# Patient Record
Sex: Female | Born: 1970
Health system: Southern US, Community
[De-identification: ages and names within clinical notes are randomized; demographics above are authoritative.]

## PROBLEM LIST (undated history)

## (undated) DIAGNOSIS — G8929 Other chronic pain: Secondary | ICD-10-CM

## (undated) DIAGNOSIS — T7840XA Allergy, unspecified, initial encounter: Secondary | ICD-10-CM

## (undated) DIAGNOSIS — G5600 Carpal tunnel syndrome, unspecified upper limb: Secondary | ICD-10-CM

## (undated) DIAGNOSIS — N889 Noninflammatory disorder of cervix uteri, unspecified: Secondary | ICD-10-CM

## (undated) DIAGNOSIS — M545 Low back pain: Secondary | ICD-10-CM

## (undated) DIAGNOSIS — N83209 Unspecified ovarian cyst, unspecified side: Secondary | ICD-10-CM

## (undated) DIAGNOSIS — K589 Irritable bowel syndrome without diarrhea: Secondary | ICD-10-CM

## (undated) DIAGNOSIS — F419 Anxiety disorder, unspecified: Secondary | ICD-10-CM

## (undated) DIAGNOSIS — R51 Headache: Secondary | ICD-10-CM

## (undated) DIAGNOSIS — D259 Leiomyoma of uterus, unspecified: Secondary | ICD-10-CM

## (undated) DIAGNOSIS — I10 Essential (primary) hypertension: Secondary | ICD-10-CM

## (undated) DIAGNOSIS — A64 Unspecified sexually transmitted disease: Secondary | ICD-10-CM

## (undated) DIAGNOSIS — M542 Cervicalgia: Secondary | ICD-10-CM

## (undated) DIAGNOSIS — N9489 Other specified conditions associated with female genital organs and menstrual cycle: Secondary | ICD-10-CM

## (undated) DIAGNOSIS — K649 Unspecified hemorrhoids: Secondary | ICD-10-CM

## (undated) HISTORY — DX: Irritable bowel syndrome, unspecified: K58.9

## (undated) HISTORY — DX: Unspecified ovarian cyst, unspecified side: N83.209

## (undated) HISTORY — DX: Cervicalgia: M54.2

## (undated) HISTORY — PX: EYE SURGERY: SHX253

## (undated) HISTORY — DX: Essential (primary) hypertension: I10

## (undated) HISTORY — PX: EMBOLIZATION: SHX1496

## (undated) HISTORY — DX: Low back pain: M54.5

## (undated) HISTORY — DX: Other specified conditions associated with female genital organs and menstrual cycle: N94.89

## (undated) HISTORY — DX: Allergy, unspecified, initial encounter: T78.40XA

## (undated) HISTORY — PX: FRACTURE SURGERY: SHX138

## (undated) HISTORY — DX: Unspecified sexually transmitted disease: A64

## (undated) HISTORY — DX: Carpal tunnel syndrome, unspecified upper limb: G56.00

## (undated) HISTORY — PX: LIPOSUCTION: SHX10

## (undated) HISTORY — DX: Noninflammatory disorder of cervix uteri, unspecified: N88.9

## (undated) HISTORY — DX: Unspecified hemorrhoids: K64.9

---

## 1991-02-19 HISTORY — PX: EYE SURGERY: SHX253

## 1991-02-19 HISTORY — PX: FACIAL FRACTURE SURGERY: SHX1570

## 1994-02-18 HISTORY — PX: TUBAL LIGATION: SHX77

## 1997-08-24 ENCOUNTER — Encounter: Admission: RE | Admit: 1997-08-24 | Discharge: 1997-08-24 | Payer: Self-pay | Admitting: Family Medicine

## 1997-09-02 ENCOUNTER — Encounter: Admission: RE | Admit: 1997-09-02 | Discharge: 1997-09-02 | Payer: Self-pay | Admitting: Family Medicine

## 1997-09-29 ENCOUNTER — Encounter: Admission: RE | Admit: 1997-09-29 | Discharge: 1997-09-29 | Payer: Self-pay | Admitting: Family Medicine

## 1997-10-21 ENCOUNTER — Other Ambulatory Visit: Admission: RE | Admit: 1997-10-21 | Discharge: 1997-10-21 | Payer: Self-pay | Admitting: Legal Medicine

## 1997-10-21 ENCOUNTER — Encounter: Admission: RE | Admit: 1997-10-21 | Discharge: 1997-10-21 | Payer: Self-pay | Admitting: Family Medicine

## 1998-02-09 ENCOUNTER — Encounter: Admission: RE | Admit: 1998-02-09 | Discharge: 1998-02-09 | Payer: Self-pay | Admitting: Sports Medicine

## 1998-07-11 ENCOUNTER — Encounter: Admission: RE | Admit: 1998-07-11 | Discharge: 1998-07-11 | Payer: Self-pay | Admitting: Family Medicine

## 1998-10-09 ENCOUNTER — Encounter: Admission: RE | Admit: 1998-10-09 | Discharge: 1998-10-09 | Payer: Self-pay | Admitting: Family Medicine

## 1998-10-25 ENCOUNTER — Encounter: Admission: RE | Admit: 1998-10-25 | Discharge: 1998-10-25 | Payer: Self-pay | Admitting: Family Medicine

## 1999-01-22 ENCOUNTER — Encounter: Admission: RE | Admit: 1999-01-22 | Discharge: 1999-01-22 | Payer: Self-pay | Admitting: Family Medicine

## 1999-03-14 ENCOUNTER — Encounter: Admission: RE | Admit: 1999-03-14 | Discharge: 1999-03-14 | Payer: Self-pay | Admitting: Family Medicine

## 1999-04-22 ENCOUNTER — Emergency Department (HOSPITAL_COMMUNITY): Admission: EM | Admit: 1999-04-22 | Discharge: 1999-04-22 | Payer: Self-pay | Admitting: *Deleted

## 1999-06-06 ENCOUNTER — Other Ambulatory Visit: Admission: RE | Admit: 1999-06-06 | Discharge: 1999-06-06 | Payer: Self-pay | Admitting: Obstetrics & Gynecology

## 1999-06-15 ENCOUNTER — Emergency Department (HOSPITAL_COMMUNITY): Admission: EM | Admit: 1999-06-15 | Discharge: 1999-06-15 | Payer: Self-pay | Admitting: Emergency Medicine

## 2000-06-23 ENCOUNTER — Other Ambulatory Visit: Admission: RE | Admit: 2000-06-23 | Discharge: 2000-06-23 | Payer: Self-pay | Admitting: Obstetrics & Gynecology

## 2002-02-18 DIAGNOSIS — N83209 Unspecified ovarian cyst, unspecified side: Secondary | ICD-10-CM

## 2002-02-18 HISTORY — DX: Unspecified ovarian cyst, unspecified side: N83.209

## 2002-05-19 ENCOUNTER — Encounter: Admission: RE | Admit: 2002-05-19 | Discharge: 2002-05-19 | Payer: Self-pay | Admitting: Family Medicine

## 2002-08-02 ENCOUNTER — Other Ambulatory Visit: Admission: RE | Admit: 2002-08-02 | Discharge: 2002-08-02 | Payer: Self-pay | Admitting: Family Medicine

## 2002-08-02 ENCOUNTER — Encounter: Admission: RE | Admit: 2002-08-02 | Discharge: 2002-08-02 | Payer: Self-pay | Admitting: Family Medicine

## 2002-08-03 ENCOUNTER — Encounter: Payer: Self-pay | Admitting: Sports Medicine

## 2002-08-03 ENCOUNTER — Encounter: Admission: RE | Admit: 2002-08-03 | Discharge: 2002-08-03 | Payer: Self-pay | Admitting: Sports Medicine

## 2002-11-23 ENCOUNTER — Encounter: Admission: RE | Admit: 2002-11-23 | Discharge: 2002-11-23 | Payer: Self-pay | Admitting: Sports Medicine

## 2002-11-30 ENCOUNTER — Encounter: Admission: RE | Admit: 2002-11-30 | Discharge: 2002-11-30 | Payer: Self-pay | Admitting: Sports Medicine

## 2002-11-30 ENCOUNTER — Encounter: Payer: Self-pay | Admitting: Sports Medicine

## 2002-12-09 ENCOUNTER — Encounter: Admission: RE | Admit: 2002-12-09 | Discharge: 2002-12-09 | Payer: Self-pay | Admitting: Family Medicine

## 2003-01-12 ENCOUNTER — Encounter: Admission: RE | Admit: 2003-01-12 | Discharge: 2003-01-12 | Payer: Self-pay | Admitting: Family Medicine

## 2003-02-02 ENCOUNTER — Encounter: Admission: RE | Admit: 2003-02-02 | Discharge: 2003-03-03 | Payer: Self-pay | Admitting: Orthopedic Surgery

## 2003-02-14 ENCOUNTER — Encounter: Admission: RE | Admit: 2003-02-14 | Discharge: 2003-02-14 | Payer: Self-pay | Admitting: Family Medicine

## 2003-03-09 ENCOUNTER — Ambulatory Visit (HOSPITAL_COMMUNITY): Admission: RE | Admit: 2003-03-09 | Discharge: 2003-03-09 | Payer: Self-pay | Admitting: Orthopedic Surgery

## 2003-03-11 ENCOUNTER — Encounter: Admission: RE | Admit: 2003-03-11 | Discharge: 2003-03-11 | Payer: Self-pay | Admitting: Family Medicine

## 2003-03-16 ENCOUNTER — Encounter: Admission: RE | Admit: 2003-03-16 | Discharge: 2003-03-16 | Payer: Self-pay | Admitting: Orthopedic Surgery

## 2003-04-14 ENCOUNTER — Encounter: Admission: RE | Admit: 2003-04-14 | Discharge: 2003-04-14 | Payer: Self-pay | Admitting: Orthopedic Surgery

## 2003-04-29 ENCOUNTER — Encounter: Admission: RE | Admit: 2003-04-29 | Discharge: 2003-04-29 | Payer: Self-pay | Admitting: Orthopedic Surgery

## 2003-06-23 ENCOUNTER — Emergency Department (HOSPITAL_COMMUNITY): Admission: EM | Admit: 2003-06-23 | Discharge: 2003-06-23 | Payer: Self-pay | Admitting: Emergency Medicine

## 2004-02-19 DIAGNOSIS — K649 Unspecified hemorrhoids: Secondary | ICD-10-CM

## 2004-02-19 HISTORY — DX: Unspecified hemorrhoids: K64.9

## 2004-04-06 ENCOUNTER — Ambulatory Visit: Payer: Self-pay | Admitting: Family Medicine

## 2004-04-06 ENCOUNTER — Other Ambulatory Visit: Admission: RE | Admit: 2004-04-06 | Discharge: 2004-04-06 | Payer: Self-pay | Admitting: Family Medicine

## 2004-04-24 ENCOUNTER — Ambulatory Visit (HOSPITAL_COMMUNITY): Admission: RE | Admit: 2004-04-24 | Discharge: 2004-04-24 | Payer: Self-pay | Admitting: Family Medicine

## 2004-07-05 ENCOUNTER — Ambulatory Visit: Payer: Self-pay | Admitting: Family Medicine

## 2004-07-20 ENCOUNTER — Inpatient Hospital Stay (HOSPITAL_COMMUNITY): Admission: AD | Admit: 2004-07-20 | Discharge: 2004-07-20 | Payer: Self-pay | Admitting: Obstetrics & Gynecology

## 2004-08-06 ENCOUNTER — Ambulatory Visit: Payer: Self-pay | Admitting: Family Medicine

## 2004-12-12 ENCOUNTER — Emergency Department (HOSPITAL_COMMUNITY): Admission: EM | Admit: 2004-12-12 | Discharge: 2004-12-12 | Payer: Self-pay | Admitting: Family Medicine

## 2005-01-07 ENCOUNTER — Ambulatory Visit: Payer: Self-pay | Admitting: Family Medicine

## 2005-01-14 ENCOUNTER — Encounter: Admission: RE | Admit: 2005-01-14 | Discharge: 2005-01-14 | Payer: Self-pay | Admitting: Family Medicine

## 2005-01-25 ENCOUNTER — Ambulatory Visit: Payer: Self-pay | Admitting: Family Medicine

## 2005-02-13 ENCOUNTER — Ambulatory Visit (HOSPITAL_COMMUNITY): Admission: RE | Admit: 2005-02-13 | Discharge: 2005-02-13 | Payer: Self-pay | Admitting: Gastroenterology

## 2005-03-28 ENCOUNTER — Ambulatory Visit (HOSPITAL_COMMUNITY): Admission: RE | Admit: 2005-03-28 | Discharge: 2005-03-28 | Payer: Self-pay | Admitting: Family Medicine

## 2005-03-28 ENCOUNTER — Ambulatory Visit: Payer: Self-pay | Admitting: Family Medicine

## 2005-11-18 ENCOUNTER — Encounter (INDEPENDENT_AMBULATORY_CARE_PROVIDER_SITE_OTHER): Payer: Self-pay | Admitting: *Deleted

## 2005-11-18 LAB — CONVERTED CEMR LAB

## 2005-11-19 ENCOUNTER — Ambulatory Visit: Payer: Self-pay | Admitting: Family Medicine

## 2005-11-19 ENCOUNTER — Other Ambulatory Visit: Admission: RE | Admit: 2005-11-19 | Discharge: 2005-11-19 | Payer: Self-pay | Admitting: Family Medicine

## 2006-04-17 DIAGNOSIS — E669 Obesity, unspecified: Secondary | ICD-10-CM | POA: Insufficient documentation

## 2006-04-17 DIAGNOSIS — K589 Irritable bowel syndrome without diarrhea: Secondary | ICD-10-CM | POA: Insufficient documentation

## 2006-04-17 DIAGNOSIS — F339 Major depressive disorder, recurrent, unspecified: Secondary | ICD-10-CM | POA: Insufficient documentation

## 2006-04-18 ENCOUNTER — Encounter (INDEPENDENT_AMBULATORY_CARE_PROVIDER_SITE_OTHER): Payer: Self-pay | Admitting: *Deleted

## 2006-07-04 ENCOUNTER — Ambulatory Visit: Payer: Self-pay | Admitting: Family Medicine

## 2006-07-04 ENCOUNTER — Encounter (INDEPENDENT_AMBULATORY_CARE_PROVIDER_SITE_OTHER): Payer: Self-pay | Admitting: Family Medicine

## 2006-07-04 LAB — CONVERTED CEMR LAB
Blood Glucose, Fingerstick: 98
Nitrite: NEGATIVE
Protein, U semiquant: 30
Specific Gravity, Urine: >=1.03
Urobilinogen, UA: 0.2
WBC Urine, dipstick: NEGATIVE

## 2006-07-07 ENCOUNTER — Encounter (INDEPENDENT_AMBULATORY_CARE_PROVIDER_SITE_OTHER): Payer: Self-pay | Admitting: Family Medicine

## 2006-07-24 ENCOUNTER — Telehealth: Payer: Self-pay | Admitting: *Deleted

## 2006-10-01 ENCOUNTER — Ambulatory Visit: Payer: Self-pay | Admitting: Family Medicine

## 2006-10-03 ENCOUNTER — Encounter (INDEPENDENT_AMBULATORY_CARE_PROVIDER_SITE_OTHER): Payer: Self-pay | Admitting: *Deleted

## 2006-10-27 ENCOUNTER — Encounter (INDEPENDENT_AMBULATORY_CARE_PROVIDER_SITE_OTHER): Payer: Self-pay | Admitting: Family Medicine

## 2006-11-04 ENCOUNTER — Encounter (INDEPENDENT_AMBULATORY_CARE_PROVIDER_SITE_OTHER): Payer: Self-pay | Admitting: Family Medicine

## 2006-11-04 ENCOUNTER — Ambulatory Visit: Payer: Self-pay | Admitting: Family Medicine

## 2006-11-04 LAB — CONVERTED CEMR LAB
ALT: 17 units/L (ref 0–35)
AST: 14 units/L (ref 0–37)
CO2: 22 meq/L (ref 19–32)
Calcium: 8.8 mg/dL (ref 8.4–10.5)
Chloride: 107 meq/L (ref 96–112)
Platelets: 301 10*3/uL (ref 150–400)
Potassium: 3.8 meq/L (ref 3.5–5.3)
RDW: 13.4 % (ref 11.5–14.0)
Sodium: 142 meq/L (ref 135–145)
Total Protein: 7.6 g/dL (ref 6.0–8.3)
WBC: 9.2 10*3/uL (ref 4.0–10.5)

## 2006-12-10 ENCOUNTER — Encounter (INDEPENDENT_AMBULATORY_CARE_PROVIDER_SITE_OTHER): Payer: Self-pay | Admitting: Family Medicine

## 2007-01-21 ENCOUNTER — Encounter (INDEPENDENT_AMBULATORY_CARE_PROVIDER_SITE_OTHER): Payer: Self-pay | Admitting: Family Medicine

## 2007-02-20 ENCOUNTER — Ambulatory Visit: Payer: Self-pay | Admitting: Family Medicine

## 2007-09-09 ENCOUNTER — Ambulatory Visit: Payer: Self-pay | Admitting: Family Medicine

## 2007-09-09 ENCOUNTER — Encounter: Payer: Self-pay | Admitting: Family Medicine

## 2007-09-09 LAB — CONVERTED CEMR LAB
Cholesterol: 188 mg/dL (ref 0–200)
Hemoglobin: 13.1 g/dL (ref 12.0–15.0)
LDL Cholesterol: 131 mg/dL — ABNORMAL HIGH (ref 0–99)
MCHC: 33.5 g/dL (ref 30.0–36.0)
MCV: 88.1 fL (ref 78.0–100.0)
RBC: 4.44 M/uL (ref 3.87–5.11)
RDW: 13.8 % (ref 11.5–15.5)
Triglycerides: 129 mg/dL (ref <150)
VLDL: 26 mg/dL (ref 0–40)

## 2007-09-17 ENCOUNTER — Encounter: Payer: Self-pay | Admitting: Family Medicine

## 2007-10-09 ENCOUNTER — Ambulatory Visit: Payer: Self-pay | Admitting: Family Medicine

## 2007-10-09 LAB — CONVERTED CEMR LAB: Hemoglobin: 12.7 g/dL

## 2007-10-13 ENCOUNTER — Encounter (INDEPENDENT_AMBULATORY_CARE_PROVIDER_SITE_OTHER): Payer: Self-pay | Admitting: *Deleted

## 2007-11-13 ENCOUNTER — Encounter: Payer: Self-pay | Admitting: *Deleted

## 2007-12-03 ENCOUNTER — Ambulatory Visit: Payer: Self-pay | Admitting: Family Medicine

## 2007-12-03 ENCOUNTER — Other Ambulatory Visit: Admission: RE | Admit: 2007-12-03 | Discharge: 2007-12-03 | Payer: Self-pay | Admitting: Family Medicine

## 2007-12-03 ENCOUNTER — Encounter: Payer: Self-pay | Admitting: Family Medicine

## 2007-12-03 LAB — CONVERTED CEMR LAB
Glucose, Urine, Semiquant: NEGATIVE
Nitrite: NEGATIVE
Specific Gravity, Urine: 1.025
Whiff Test: NEGATIVE
pH: 5.5

## 2007-12-04 ENCOUNTER — Encounter: Payer: Self-pay | Admitting: Family Medicine

## 2007-12-04 LAB — CONVERTED CEMR LAB
Chlamydia, DNA Probe: NEGATIVE
GC Probe Amp, Genital: NEGATIVE

## 2007-12-14 ENCOUNTER — Encounter: Payer: Self-pay | Admitting: Family Medicine

## 2007-12-18 ENCOUNTER — Encounter: Payer: Self-pay | Admitting: Family Medicine

## 2007-12-22 ENCOUNTER — Telehealth (INDEPENDENT_AMBULATORY_CARE_PROVIDER_SITE_OTHER): Payer: Self-pay | Admitting: *Deleted

## 2008-01-13 ENCOUNTER — Ambulatory Visit: Payer: Self-pay | Admitting: Family Medicine

## 2008-01-13 LAB — CONVERTED CEMR LAB
Bilirubin Urine: NEGATIVE
Glucose, Urine, Semiquant: NEGATIVE
Specific Gravity, Urine: 1.02
pH: 6.5

## 2008-01-25 ENCOUNTER — Encounter: Payer: Self-pay | Admitting: Family Medicine

## 2008-01-25 ENCOUNTER — Ambulatory Visit: Payer: Self-pay | Admitting: Family Medicine

## 2008-01-25 LAB — CONVERTED CEMR LAB
Blood in Urine, dipstick: NEGATIVE
Ketones, urine, test strip: NEGATIVE
Nitrite: NEGATIVE
Protein, U semiquant: NEGATIVE
Specific Gravity, Urine: 1.02
Urobilinogen, UA: 0.2

## 2008-01-26 ENCOUNTER — Encounter: Payer: Self-pay | Admitting: Family Medicine

## 2008-01-29 ENCOUNTER — Telehealth: Payer: Self-pay | Admitting: Family Medicine

## 2008-01-29 ENCOUNTER — Encounter: Payer: Self-pay | Admitting: Family Medicine

## 2008-03-30 ENCOUNTER — Telehealth: Payer: Self-pay | Admitting: Family Medicine

## 2008-03-31 ENCOUNTER — Ambulatory Visit: Payer: Self-pay | Admitting: Family Medicine

## 2008-03-31 ENCOUNTER — Encounter: Payer: Self-pay | Admitting: Family Medicine

## 2008-03-31 LAB — CONVERTED CEMR LAB
Bilirubin Urine: NEGATIVE
Blood in Urine, dipstick: NEGATIVE
Chlamydia, DNA Probe: NEGATIVE
Urobilinogen, UA: 0.2
Whiff Test: NEGATIVE

## 2008-05-09 ENCOUNTER — Encounter: Payer: Self-pay | Admitting: Family Medicine

## 2008-07-06 ENCOUNTER — Telehealth: Payer: Self-pay | Admitting: Family Medicine

## 2008-07-28 ENCOUNTER — Encounter: Payer: Self-pay | Admitting: Family Medicine

## 2008-07-28 ENCOUNTER — Ambulatory Visit: Payer: Self-pay | Admitting: Family Medicine

## 2008-07-29 LAB — CONVERTED CEMR LAB
ALT: 16 units/L (ref 0–35)
Alkaline Phosphatase: 45 units/L (ref 39–117)
MCHC: 32.4 g/dL (ref 30.0–36.0)
MCV: 90.1 fL (ref 78.0–100.0)
Platelets: 324 10*3/uL (ref 150–400)
Sodium: 142 meq/L (ref 135–145)
Total Bilirubin: 0.4 mg/dL (ref 0.3–1.2)
Total Protein: 7.6 g/dL (ref 6.0–8.3)
WBC: 12.3 10*3/uL — ABNORMAL HIGH (ref 4.0–10.5)

## 2008-08-01 ENCOUNTER — Telehealth: Payer: Self-pay | Admitting: Family Medicine

## 2008-09-08 ENCOUNTER — Ambulatory Visit: Payer: Self-pay | Admitting: Family Medicine

## 2008-09-08 LAB — CONVERTED CEMR LAB
Blood in Urine, dipstick: NEGATIVE
Nitrite: NEGATIVE
Protein, U semiquant: NEGATIVE
Urobilinogen, UA: 0.2
WBC Urine, dipstick: NEGATIVE

## 2008-09-10 ENCOUNTER — Telehealth: Payer: Self-pay | Admitting: Family Medicine

## 2008-10-17 ENCOUNTER — Encounter: Payer: Self-pay | Admitting: Family Medicine

## 2008-12-26 ENCOUNTER — Encounter: Payer: Self-pay | Admitting: Family Medicine

## 2009-01-02 ENCOUNTER — Encounter: Payer: Self-pay | Admitting: Family Medicine

## 2009-01-02 ENCOUNTER — Other Ambulatory Visit: Admission: RE | Admit: 2009-01-02 | Discharge: 2009-01-02 | Payer: Self-pay | Admitting: Family Medicine

## 2009-01-02 ENCOUNTER — Ambulatory Visit: Payer: Self-pay | Admitting: Family Medicine

## 2009-01-03 LAB — CONVERTED CEMR LAB
Chlamydia, DNA Probe: NEGATIVE
GC Probe Amp, Genital: NEGATIVE
Hepatitis B Surface Ag: NEGATIVE

## 2009-01-04 ENCOUNTER — Encounter: Payer: Self-pay | Admitting: Family Medicine

## 2009-01-26 ENCOUNTER — Ambulatory Visit: Payer: Self-pay | Admitting: Family Medicine

## 2009-01-31 ENCOUNTER — Telehealth: Payer: Self-pay | Admitting: Family Medicine

## 2009-02-27 ENCOUNTER — Encounter: Payer: Self-pay | Admitting: Family Medicine

## 2009-04-04 ENCOUNTER — Telehealth: Payer: Self-pay | Admitting: Family Medicine

## 2009-04-05 ENCOUNTER — Ambulatory Visit: Payer: Self-pay | Admitting: Family Medicine

## 2009-04-05 ENCOUNTER — Encounter: Payer: Self-pay | Admitting: Family Medicine

## 2009-04-07 ENCOUNTER — Ambulatory Visit: Payer: Self-pay | Admitting: Family Medicine

## 2009-04-07 LAB — CONVERTED CEMR LAB: GC Probe Amp, Genital: POSITIVE — AB

## 2009-04-10 ENCOUNTER — Emergency Department (HOSPITAL_COMMUNITY): Admission: EM | Admit: 2009-04-10 | Discharge: 2009-04-11 | Payer: Self-pay | Admitting: Emergency Medicine

## 2009-04-26 ENCOUNTER — Telehealth: Payer: Self-pay | Admitting: Family Medicine

## 2009-04-28 ENCOUNTER — Encounter: Payer: Self-pay | Admitting: Family Medicine

## 2009-04-28 ENCOUNTER — Ambulatory Visit: Payer: Self-pay | Admitting: Family Medicine

## 2009-04-28 LAB — CONVERTED CEMR LAB
GC Probe Amp, Genital: NEGATIVE
Whiff Test: POSITIVE

## 2009-05-29 ENCOUNTER — Encounter: Payer: Self-pay | Admitting: Family Medicine

## 2009-07-27 ENCOUNTER — Telehealth: Payer: Self-pay | Admitting: Family Medicine

## 2009-07-27 ENCOUNTER — Ambulatory Visit: Payer: Self-pay | Admitting: Family Medicine

## 2009-08-14 ENCOUNTER — Encounter: Payer: Self-pay | Admitting: Family Medicine

## 2009-09-21 ENCOUNTER — Telehealth: Payer: Self-pay | Admitting: Family Medicine

## 2009-09-22 ENCOUNTER — Encounter: Payer: Self-pay | Admitting: Family Medicine

## 2009-09-22 ENCOUNTER — Ambulatory Visit: Payer: Self-pay | Admitting: Family Medicine

## 2009-09-22 LAB — CONVERTED CEMR LAB
Bilirubin Urine: NEGATIVE
Blood in Urine, dipstick: NEGATIVE
Glucose, Urine, Semiquant: NEGATIVE
Ketones, urine, test strip: NEGATIVE
Nitrite: NEGATIVE
Specific Gravity, Urine: 1.025
Urobilinogen, UA: 0.2
Whiff Test: NEGATIVE

## 2009-10-31 ENCOUNTER — Ambulatory Visit: Payer: Self-pay | Admitting: Family Medicine

## 2009-10-31 ENCOUNTER — Telehealth: Payer: Self-pay | Admitting: Family Medicine

## 2009-10-31 DIAGNOSIS — G44209 Tension-type headache, unspecified, not intractable: Secondary | ICD-10-CM | POA: Insufficient documentation

## 2009-11-08 ENCOUNTER — Ambulatory Visit: Payer: Self-pay | Admitting: Family Medicine

## 2009-11-22 ENCOUNTER — Telehealth: Payer: Self-pay | Admitting: Family Medicine

## 2009-11-23 ENCOUNTER — Ambulatory Visit: Payer: Self-pay | Admitting: Family Medicine

## 2009-11-23 ENCOUNTER — Encounter: Payer: Self-pay | Admitting: Sports Medicine

## 2009-12-21 ENCOUNTER — Encounter: Payer: Self-pay | Admitting: Family Medicine

## 2010-01-09 ENCOUNTER — Ambulatory Visit: Payer: Self-pay | Admitting: Family Medicine

## 2010-01-09 DIAGNOSIS — R339 Retention of urine, unspecified: Secondary | ICD-10-CM | POA: Insufficient documentation

## 2010-01-09 LAB — CONVERTED CEMR LAB
Glucose, Urine, Semiquant: NEGATIVE
Urobilinogen, UA: 0.2
pH: 7.5

## 2010-02-16 ENCOUNTER — Encounter
Admission: RE | Admit: 2010-02-16 | Discharge: 2010-02-16 | Payer: Self-pay | Source: Home / Self Care | Attending: Family Medicine | Admitting: Family Medicine

## 2010-02-16 ENCOUNTER — Ambulatory Visit
Admission: RE | Admit: 2010-02-16 | Discharge: 2010-02-16 | Payer: Self-pay | Source: Home / Self Care | Attending: Family Medicine | Admitting: Family Medicine

## 2010-02-16 DIAGNOSIS — M542 Cervicalgia: Secondary | ICD-10-CM | POA: Insufficient documentation

## 2010-02-16 DIAGNOSIS — K089 Disorder of teeth and supporting structures, unspecified: Secondary | ICD-10-CM | POA: Insufficient documentation

## 2010-02-21 ENCOUNTER — Telehealth: Payer: Self-pay | Admitting: Family Medicine

## 2010-03-05 ENCOUNTER — Encounter: Payer: Self-pay | Admitting: Family Medicine
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
## 2010-03-20 NOTE — Progress Notes (Signed)
Summary: triage  Phone Note Call from Patient Call back at Home Phone [phone]   Caller: Patient Summary of Call: Pt has gum infection and wondering if she can be seen today? Initial call taken by: Clydell Hakim,  July 27, 2009 8:44 AM  Follow-up for Phone Call        states she is having gum swelling & pain around a tooth that she wants to have fixed. no money at this time but they (dentist) told her she would have to be infection free first. to come now for work in Follow-up by: Golden Circle RN,  July 27, 2009 9:19 AM

## 2010-03-20 NOTE — Progress Notes (Signed)
  Phone Note Call from Patient   Caller: Patient Call For: [phone] Summary of Call: Patient having severe headaches.  Need to speak with nurse as soon as possible. Initial call taken by: Britta Mccreedy mcgregor  Follow-up for Phone Call        states she has alway had them but more severe over past 3 wks. usually takes aleve or excedrin pm which helps.  sleep helps.  was nauseous on saturday. muscle tension in neck as well. she will be here by 4 to see Dr. Wallene Huh. states this HA will not go away. states she is safe to drive Follow-up by: Golden Circle RN,  October 31, 2009 3:22 PM

## 2010-03-20 NOTE — Consult Note (Signed)
[facility]   Imported By: Clydell Hakim 04/03/2009 15:56:57  _____________________________________________________________________  External Attachment:    Type:   Image     Comment:   External Document

## 2010-03-20 NOTE — Progress Notes (Signed)
Summary: needs meds  Phone Note Call from Patient Call back at Home Phone [phone]   Caller: Patient Summary of Call: pt wants meds called in to yeast inf - got it from antibiotic [facility] Initial call taken by: De Nurse,  September 21, 2009 4:24 PM  Follow-up for Phone Call        she will see Dr. Denyse Amass tomorrow pm for this Follow-up by: Golden Circle RN,  September 21, 2009 4:32 PM

## 2010-03-20 NOTE — Assessment & Plan Note (Signed)
Summary: std check/[facility]   Vital Signs:  Patient profile:   40 year old female Weight:      196.3 pounds Temp:     98.7 degrees F oral Pulse rate:   102 / minute Pulse rhythm:   regular BP sitting:   125 / 82  (right arm) Cuff size:   large  Vitals Entered By: Loralee Pacas CMA (April 05, 2009 8:54 AM) Comments vaginal d/c slight odor   Primary Care Provider:  Milinda Antis MD   History of Present Illness: 1) STD check: Patient requests STD check. Last unprotected intercourse was two weeks ago, concerned about exposure to non-specific STD. Reports mild increase in vaginal discharge - now white with "different" odor. Denies vaginal pain, dysuria, vaginal lesions, back pain, dyspareunia, fever, chills, weight loss, night sweats. LMP wwas 03/11/09. BTL in 1996.     Allergies (verified): No Known Drug Allergies  Physical Exam  General:  vitals reviewed. obese tearful regarding STD possibility NAD  Mouth:  no oral lesions  Neck:  no lymphadenopathy   Lungs:  CTAB  Abdomen:  S/NT/ND +BS  Genitalia:  normal introitus and no external lesions. white vaginal discharge w/o odor. normal introitus, no external lesions, mucosa pink and moist, no vaginal or cervical lesions, normal uterus size and position, and no adnexal masses or tenderness.   Neurologic:  alert & oriented X3.     Impression & Recommendations:  Problem # 1:  EXPOSURE TO VIRAL DISEASE, NEC (ICD-V01.79) Assessment New Will check HIV, RPR. Encouraged condom use.  Orders: HIV-[facility] ([id number]) RPR-[facility] [id number]) [facility]- Est Level  3 (53664)  Problem # 2:  VAGINAL DISCHARGE (ICD-623.5) Assessment: New  Will check GC/CT, wet prep. Advised condom use. Wet prep +ve for BV. Will treat with metronidazole as below x 7 days.   Orders: [facility]- Est Level  3 ([address]347)  Complete Medication List: 1)  Bentyl 20 Mg Tabs (Dicyclomine hcl) .Marland Kitchen.. 1 by mouth qid x 7 days 2)  Acidophilus Caps (Lactobacillus) .Marland Kitchen.. 1 by mouth  daily x 7-10 days 3)  Metronidazole 500 Mg Tabs (Metronidazole) .... One tab by mouth two times a day x 7 days  Other Orders: GC[facility]lamydia[facility] ([id number]) [facility]  Patient Instructions: 1)  We will call with the rest of your test results. 2)  Take metronidazole for 7 days for bacterial vaginosis. This is not an  STD. 3)  Use condoms every time you have intercourse if your partner has not been tested for STDs or has an STD  Prescriptions: METRONIDAZOLE 500 MG TABS (METRONIDAZOLE) one tab by mouth two times a day x 7 days  #14 x 0   Entered and Authorized by:   Bobby Rumpf  MD   Signed by:   Bobby Rumpf  MD on 04/05/2009   Method used:   Electronically to        [facility] [address] [phone]* (retail)       [address]       [place], [place]  [address]       Ph: [phone] or [phone]       Fax: [phone]   RxID:   [id number]   Laboratory Results  Date/Time Received: April 05, 2009 9:29 AM  Date/Time Reported: April 05, 2009 9:39 AM   [employer] Source: vaginal WBC/hpf: 1-5 Bacteria/hpf: 3+  Cocci Clue cells/hpf: moderate  Positive whiff Yeast/hpf: none Trichomonas/hpf: none Comments: rod bacteria also  present ...........test performed by...........Marland KitchenTerese Door, CMA

## 2010-03-20 NOTE — Progress Notes (Signed)
Summary: triage  Phone Note Call from Patient Call back at Home Phone [phone]   Caller: Patient Summary of Call: pt is wanting to come in today to check for STD Initial call taken by: De Nurse,  April 04, 2009 10:02 AM  Follow-up for Phone Call        her partner told her to get checked. she does not know if he has something specific. will be here at 8:30 tomorrow am. declined the work in with wait today Follow-up by: Golden Circle RN,  April 04, 2009 10:07 AM

## 2010-03-20 NOTE — Progress Notes (Signed)
Summary: referral  Phone Note Call from Patient Call back at Home Phone [phone]   Caller: Patient Summary of Call: pt needs to have a mammogram and needs phys orders to go to [facility] hosp Initial call taken by: De Nurse,  April 26, 2009 3:26 PM  Follow-up for Phone Call        willn forward to MD for order please. Follow-up by: Theresia Lo RN,  April 26, 2009 3:46 PM  Additional Follow-up for Phone Call Additional follow up Details #1::        Order given Additional Follow-up by: Milinda Antis MD,  April 26, 2009 6:01 PM  New Problems: UNSPECIFIED BREAST SCREENING (ICD-V76.10)   New Problems: UNSPECIFIED BREAST SCREENING (ICD-V76.10)

## 2010-03-20 NOTE — Assessment & Plan Note (Signed)
Summary: bladder prob,df   Vital Signs:  Patient profile:   40 year old female Weight:      200 pounds Temp:     98.36 degrees F oral Pulse rate:   80 / minute Pulse rhythm:   regular BP sitting:   136 / 85  (left arm) Cuff size:   large  Vitals Entered By: Loralee Pacas CMA (January 09, 2010 1:46 PM) CC: ? bladder inf   Primary Care Provider:  Milinda Antis MD  CC:  ? bladder inf.  History of Present Illness:    Difficulty urinating for a few months, feels a lot of pressure and like she can not empty her bladder completely even though she has to urinate , urinating frequently states she drink a lot of fluid at night , Feel like she has dark urine, has been drinking a lot of water which helped the color. This weekened was laughing  and had small amount of  incontinence . no blood in urine noticed  Cramping in abdomen, feels tender in bladder, no vaginal discharge , no fever,  no dysuria,    Feels constipated thinks this is due to not eating her [place] Milch which regulates her IBS, also feels like this makes the pressure worse Pt concerned the urine symptoms were secondary to sexual intercourse and use of toys   Has been using nyquil to help her sleep,taking previous meds for tension HA- phenergan, flexeril, taking Excedrin HA, states naprosyn did not help     Current Medications (verified): 1)  Hydrocortisone 1 % Crea (Hydrocortisone) .... Apply To Affected Area Twice A Day As Needed Itch 2)  Keflex 500 Mg Caps (Cephalexin) .Marland Kitchen.. 1 By Mouth Two Times A Day X 3 Days 3)  Pyridium 100 Mg Tabs (Phenazopyridine Hcl) .Marland Kitchen.. 1 By Mouth Three Times A Day X 2 Days 4)  Fluconazole 150 Mg Tabs (Fluconazole) .Marland Kitchen.. 1 By Mouth X1 For Yeast Infection, May Repeat in 3 Days  Allergies (verified): No Known Drug Allergies  Physical Exam  General:  Well-developed,well-nourished,in no acute distress; alert,appropriate and cooperative throughout examination Vital signs noted  Abdomen:  soft, normal  bowel sounds, and no distention.  Bladder not distended, suprapubic tenderness No CVA tenderness TTP in lower quandrants, neg rosvings, no masses felt, no rebound, no gaurding   Impression & Recommendations:  Problem # 1:  UTI (ICD-599.0) Assessment New Will treat for umcomplicated UTI Her updated medication list for this problem includes:    Keflex 500 Mg Caps (Cephalexin) .Marland Kitchen... 1 by mouth two times a day x 3 days    Pyridium 100 Mg Tabs (Phenazopyridine hcl) .Marland Kitchen... 1 by mouth three times a day x 2 days  Orders: Urinalysis-[facility] (00000) [facility]- Est  Level 4 ([address])  Problem # 2:  URINARY RETENTION (ICD-788.20) Assessment: New  Pt describing some urinary retention,however over a prolonged period of time, no evidence of acute retention, therefore no cath done. Will d/c anti-cholinergic meds and those with like properties- flexeril, phenergan,nyquil pt to return for repeat visit Consider urology, bladder emptying scan if no improvment in symptoms  Orders: [facility]- Est  Level 4 ([address])  Complete Medication List: 1)  Hydrocortisone 1 % Crea (Hydrocortisone) .... Apply to affected area twice a day as needed itch 2)  Keflex 500 Mg Caps (Cephalexin) .Marland Kitchen.. 1 by mouth two times a day x 3 days 3)  Pyridium 100 Mg Tabs (Phenazopyridine hcl) .Marland Kitchen.. 1 by mouth three times a day x 2 days 4)  Fluconazole 150  Mg Tabs (Fluconazole) .Marland Kitchen.. 1 by mouth x1 for yeast infection, may repeat in 3 days  Patient Instructions: 1)  Use advil no more than twice a day for your headaches, do not use more than 2 days in a row, use massage as well 2)  Stop taking the nyquil, flexeril, phenergan 3)  Take the Keflex for your urine infection 4)  I have sent diflucan in case you get a yeast infection 5)  F/U in 1 month to see how your symptoms are  Prescriptions: FLUCONAZOLE 150 MG TABS (FLUCONAZOLE) 1 by mouth x1 for yeast infection, may repeat in 3 days  #2 x 0   Entered and Authorized by:   Milinda Antis MD   Signed by:    Milinda Antis MD on 01/09/2010   Method used:   Electronically to        [facility] [phone]* (retail)       [address]       [place], [place]  [address]       Ph: [phone] or [phone]       Fax: [phone]   RxID:   [id number] PYRIDIUM 100 MG TABS (PHENAZOPYRIDINE HCL) 1 by mouth three times a day x 2 days  #6 x 0   Entered and Authorized by:   Milinda Antis MD   Signed by:   Milinda Antis MD on 01/09/2010   Method used:   Electronically to        [facility] [phone]* (retail)       [address]       [place], [place]  [address]       Ph: [phone] or [phone]       Fax: [phone]   RxID:   [id number] KEFLEX 500 MG CAPS (CEPHALEXIN) 1 by mouth two times a day x 3 days  #6 x 0   Entered and Authorized by:   Milinda Antis MD   Signed by:   Milinda Antis MD on 01/09/2010   Method used:   Electronically to        [facility] [phone]* (retail)       [address]       [place], [place]  [address]       Ph: [phone] or [phone]       Fax: [phone]   RxID:   [id number]    Orders Added: 1)  Urinalysis-[facility] [00000] 2)  [facility]- Est  Level 4 [[address]]    Laboratory Results   Urine Tests  Date/Time Received: January 09, 2010 1:48 PM  Date/Time Reported: January 09, 2010 2:25 PM   Routine Urinalysis   Color: yellow Appearance: Clear Glucose: negative   (Normal Range: Negative) Bilirubin: negative   (Normal Range: Negative) Ketone: negative   (Normal Range: Negative) Spec. Gravity: 1.025   (Normal Range: 1.003-1.035) Blood: negative   (Normal Range: Negative) pH: 7.5   (Normal Range: 5.0-8.0) Protein: trace   (Normal Range: Negative) Urobilinogen: 0.2   (Normal Range: 0-1) Nitrite: negative   (Normal Range: Negative) Leukocyte Esterace: trace   (Normal Range: Negative)  Urine Microscopic WBC/HPF: 0-3 RBC/HPF:  0-3 Bacteria/HPF: 2+ Mucous/HPF: 2+ Epithelial/HPF: 10-20    Comments: ...........test performed by...........Marland KitchenTerese Door, CMA
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
## 2010-03-20 NOTE — Assessment & Plan Note (Signed)
Summary: migraines & neck pain/[place](pcp not here)   Vital Signs:  Patient profile:   40 year old female Weight:      206 pounds Temp:     98.4 degrees F oral Pulse rate:   83 / minute Pulse rhythm:   regular BP sitting:   128 / 88  (left arm) Cuff size:   large  Vitals Entered By: Loralee Pacas CMA (November 23, 2009 3:15 PM) Is Patient Diabetic? No   Primary Care Provider:  Milinda Antis MD   History of Present Illness: Noted prior hx: Headache: Reports severe headaches x 3 weeks, occuring every other day. Does not wake from sleep. Brought on by stress.  Left sided from neck/occiput to temporal. Pounding / throbbing.  Last > 4 hours. Worse with bright lights / loud noises. +nausea w/ single episode of emesis. Relieved by resting, also by [employer], Excedrin PM which she has been taking every other day for past three weeks. Feels tightness in shoulder muscles and neck muscles - worse with stress, better with massage. Lots of stress due to finances. No prior diagnosis of  migraine but gets these headaches every few months.   Got Toradol and phenergan.  HA better today, main complaint is neck pain.  Notes that the pain goes from her left mastoid process to lower cervical spinous processes.  She also notes this tight feeling radiates to the left side of her head.  She endorses a lump in the neck muscles.  No aura with her HA.  HA last for hours, not days.  Lots of stress as noted above.  No visual changes, fevers, N/V/D/C, rashes, numbness/weakness in ext.    Habits & Providers  Alcohol-Tobacco-Diet     Tobacco Status: never     Year Quit: 1988  Current Medications (verified): 1)  Promethazine Hcl 25 Mg Tabs (Promethazine Hcl) .... One Tab By Mouth Q6hrs As Needed For Nausea With Headache 2)  Hydrocortisone 1 % Crea (Hydrocortisone) .... Apply To Affected Area Twice A Day As Needed Itch 3)  Diflucan 100 Mg Tabs (Fluconazole) .Marland Kitchen.. 1 By Mouth Q Weekly For Recurrent Yeast  Infections 4)  Flexeril 5 Mg Tabs (Cyclobenzaprine Hcl) .... One To Two Tabs By Mouth Q8h As Needed For Neck Spasm. 5)  Naproxen 500 Mg Tabs (Naproxen) .... One Tab By Mouth Bid  Allergies (verified): No Known Drug Allergies  Review of Systems       See HPI  Physical Exam  General:  Well-developed,well-nourished,in no acute distress; alert,appropriate and cooperative throughout examination Head:  Normocephalic and atraumatic without obvious abnormalities. No apparent alopecia or balding. Eyes:  No corneal or conjunctival inflammation noted. EOMI. Perrla.  Mouth:  Mouth opens/closes normally, good occlusion, no TMJ catching, locking, swelling. Neck:  TTP along L splenius capitis muscle Full ROM to flexion, ext, rotation, side bending. Neg spurling's sign bilaterally. Neurologic:  Grossly non-focal.   Impression & Recommendations:  Problem # 1:  HEADACHE, TENSION (ICD-307.81) Assessment New This patient has L SPLENIUS CAPITIS muscle spasm. This can cause tension type HA and her symptoms do correspond to the anatomical origin and insertion of this muscle. Short course flexeril. Heading pad/hot rice bag naproxen two times a day as needed. Sports Medicine advisor neck spasm rehab exercises given. Pt to RTC if no better in 1-2 weeks.  Her updated medication list for this problem includes:    Naproxen 500 Mg Tabs (Naproxen) ..... One tab by mouth bid  Orders: [facility]- Est  Level 4 (99214)  Complete Medication List: 1)  Promethazine Hcl 25 Mg Tabs (Promethazine hcl) .... One tab by mouth q6hrs as needed for nausea with headache 2)  Hydrocortisone 1 % Crea (Hydrocortisone) .... Apply to affected area twice a day as needed itch 3)  Diflucan 100 Mg Tabs (Fluconazole) .Marland Kitchen.. 1 by mouth q weekly for recurrent yeast infections 4)  Flexeril 5 Mg Tabs (Cyclobenzaprine hcl) .... One to two tabs by mouth q8h as needed for neck spasm. 5)  Naproxen 500 Mg Tabs (Naproxen) .... One tab by mouth  bid  Patient Instructions: 1)  Great to see you, 2)  I think you have a tension type headache associated with your neck spasm. 3)  Short course of flexeril. 4)  Rehab exercises on handout. 5)  Heating pad often. 6)  naproxen for pain. 7)  Come back to see [place] if no better. 8)  -Dr. Karie Schwalbe. Prescriptions: NAPROXEN 500 MG TABS (NAPROXEN) One tab by mouth BID  #30 x 0   Entered and Authorized by:   Rodney Langton MD   Signed by:   Rodney Langton MD on 11/23/2009   Method used:   Print then Give to Patient   RxID:   [id number] FLEXERIL 5 MG TABS (CYCLOBENZAPRINE HCL) One to two tabs by mouth q8h as needed for neck spasm.  #30 x 0   Entered and Authorized by:   Rodney Langton MD   Signed by:   Rodney Langton MD on 11/23/2009   Method used:   Print then Give to Patient   RxID:   [id number]

## 2010-03-20 NOTE — Assessment & Plan Note (Signed)
Summary: gum infection/[place]   Vital Signs:  Patient profile:   40 year old female Weight:      197.3 pounds Temp:     98.1 degrees F oral Pulse rate:   81 / minute Pulse rhythm:   regular BP sitting:   111 / 78  (left arm) Cuff size:   large  Vitals Entered By: Loralee Pacas CMA (July 27, 2009 10:21 AM)  Primary Care Provider:  Milinda Antis MD  CC:  dental infection.  History of Present Illness: patient with h/o dental infection. has been given augmentin x2 ten day courses by dentist. has appt scheduled 6/20. after initial course, some improvement, but pain and swelling reoccurred. denies fever, chills, N/V. patient has developed some back pain, which she is concerned may be related to antibiotic. denies dysuria, constipation, diarrhea. able to eat and drink ok.  Current Medications (verified): 1)  None  Allergies (verified): No Known Drug Allergies  Physical Exam  General:  overweight female, NAD. vitals reviewed. uncomfortable appearing.  Mouth:  swelling of R aspect of mandible. +TTP. no erythema, fluctuance, induration.    Impression & Recommendations:  Problem # 1:  ABSCESS, TOOTH (ICD-522.5) Assessment New  will change to clindamycin x2 weeks which will overlap with dental appt. need to see dentist for definitive tx.   Orders: [facility]- Est Level  3 (16109)  Problem # 2:  BACK PAIN (ICD-724.5) Assessment: New  schedule appt to discuss with PCP if NSAIDs, tylenol not affective.   Orders: [facility]- Est Level  3 (60454)  Patient Instructions: 1)  Keep your appointment with the dentist.  Prescriptions: FLUCONAZOLE 150 MG TABS (FLUCONAZOLE) one tab by mouth x1 for yeast infection  #1 x 0   Entered and Authorized by:   Lequita Asal  MD   Signed by:   Lequita Asal  MD on 07/27/2009   Method used:   Electronically to        [facility] [address] [phone]* (retail)       [address]       [place], [place]  [address]  Ph: [phone] or [phone]       Fax: [phone]   RxID:   [id number] CLINDAMYCIN HCL 300 MG CAPS (CLINDAMYCIN HCL) one tab by mouth q6 hours x 14 days.  #56 x 0   Entered and Authorized by:   Lequita Asal  MD   Signed by:   Lequita Asal  MD on 07/27/2009   Method used:   Electronically to        [facility] [phone]* (retail)       [address]       [place], [place]  [address]       Ph: [phone] or [phone]       Fax: [phone]   RxID:   [id number]   Appended Document: gum infection/[place] patient with h/o frequent vaginal candidal infections. given rx for fluconazole in case she develops symptoms given prolonged courses of antibiotics.

## 2010-03-20 NOTE — Letter (Signed)
Summary: Handout Printed  Printed Handout:  - Headache, Tension (Muscle Contraction Headache) 

## 2010-03-20 NOTE — Progress Notes (Signed)
  Phone Note Call from Patient   Caller: Patient Call For: [phone] Summary of Call: Pain & stiffness to neck  from migraines x 2 to 3 days. Initial call taken by: Abundio Miu,  November 22, 2009 1:54 PM  Follow-up for Phone Call        she is taking aleve & standing in a hot shower to help with the pain. we have no appts here. offered UC. she declined. appt made for tomorrow with Dr. Karie Schwalbe. advised trial of a heating pad to area. use UC if needed today Follow-up by: Golden Circle RN,  November 22, 2009 2:02 PM

## 2010-03-20 NOTE — Consult Note (Signed)
[facility]   Imported By: Clydell Hakim 06/27/2009 15:10:44  _____________________________________________________________________  External Attachment:    Type:   Image     Comment:   External Document

## 2010-03-20 NOTE — Assessment & Plan Note (Signed)
Summary: f/u/kh   Vital Signs:  Patient profile:   40 year old female Height:      64.5 inches Weight:      202 pounds BMI:     34.26 Temp:     99.4 degrees F oral Pulse rate:   92 / minute BP sitting:   132 / 85  (right arm) Cuff size:   large  Vitals Entered By: Tessie Fass CMA (November 08, 2009 2:11 PM)   CC: F/U Is Patient Diabetic? No Pain Assessment Patient in pain? no        Primary Care Provider:  Milinda Antis MD  CC:  F/U.  History of Present Illness:    Feeling sick x 2-3 days, last pm started nyquil and orange juice. Feels dizzy this am, no heache, occ cough, sore throat, no body aches, no fever, tolerating by mouth, loose stools yesterday. Taking phenergan as prescribed  Vaginal irriation- feels irriated all the time, s/p fluconazole in Aug, repeated the dose. small amount of white discharge. occ cramps but period to start. has BTL. Neg cultures in Aug  Uses dove soap and anti-septic to keep boils and ingrown hairs at bay  Habits & Providers  Alcohol-Tobacco-Diet     Tobacco Status: never  Current Medications (verified): 1)  Promethazine Hcl 25 Mg Tabs (Promethazine Hcl) .... One Tab By Mouth Q6hrs As Needed For Nausea With Headache  Allergies (verified): No Known Drug Allergies  Physical Exam  General:  VS noted. Well NAD Eyes:  non injected Ears:  TMs clear bilaterally, hearing grossly intact bilaterally . Nose:  no rhinorrhea  Mouth:  moist membranes , no erythema Neck:  supple no LAD Lungs:  CTAB Heart:  RRR, no murmur Abdomen:  soft, non-tender, and normal bowel sounds.   Genitalia:  Normal introitus for age, introitus with erythema/irritation, white discharge, non odorous. small 4mm boil on right side of mons pubis No cervical lesions, no vaginal atrophy, normal uterus size and position,    Impression & Recommendations:  Problem # 1:  VIRAL INFECTION (ICD-079.99) Assessment New  Viral syndrome with URI and some gi  symptoms, supportive care no red flags  Orders: [facility]- Est  Level 4 (16109)  Problem # 2:  VAGINAL DISCHARGE (ICD-623.5)/irriation Assessment: Deteriorated Recurrent yeast infections though negative today on wet prep, likley cause of irriation, s/p STD check. Will start suppresive anti-fungal medication at 100mg  q weekly and monitor response , also given hydrocortisone for irrtation externally. Also discussed avoiding any harsh soaps, detergents, pt to wear cotton underwear Orders: Wet Prep- [facility] (87210) [facility]- Est  Level 4 (60454)  Complete Medication List: 1)  Promethazine Hcl 25 Mg Tabs (Promethazine hcl) .... One tab by mouth q6hrs as needed for nausea with headache 2)  Hydrocortisone 1 % Crea (Hydrocortisone) .... Apply to affected area twice a day as needed itch 3)  Diflucan 100 Mg Tabs (Fluconazole) .Marland Kitchen.. 1 by mouth q weekly for recurrent yeast infections  Patient Instructions: 1)  Take the antibiotic weekly to help suppress the yeast 2)  Use the cream on on the outside of vagina only 3)  Drink plenty of fluids, try gaterade  Prescriptions: DIFLUCAN 100 MG TABS (FLUCONAZOLE) 1 by mouth q weekly for recurrent yeast infections  #8 x 3   Entered and Authorized by:   Milinda Antis MD   Signed by:   Milinda Antis MD on 11/08/2009   Method used:   Electronically to        [facility]  [address] [phone]* (retail)       [address]       [place], [place]  [address]       Ph: [phone]       Fax: [phone]   RxID:   [id number] HYDROCORTISONE 1 % CREA (HYDROCORTISONE) apply to affected area twice a day as needed itch  #1 x 3   Entered and Authorized by:   Milinda Antis MD   Signed by:   Milinda Antis MD on 11/08/2009   Method used:   Electronically to        [facility] [phone]* (retail)       [address]       [place], [place]  [address]       Ph: [phone]       Fax: [phone]   RxID:   [id number]   Laboratory Results  Date/Time Received: November 08, 2009 2:44 PM  Date/Time Reported: November 08, 2009 2:52 PM   [employer] Source: vaginal WBC/hpf: 5-10 Bacteria/hpf: 3+ Clue cells/hpf: none Yeast/hpf: none Trichomonas/hpf: none Comments: ...........test performed by...........Marland KitchenTerese Door, CMA

## 2010-03-20 NOTE — Consult Note (Signed)
Summary: [facility]  [facility] Dermatology   Imported By: Clydell Hakim 10/02/2009 14:58:10  _____________________________________________________________________  External Attachment:    Type:   Image     Comment:   External Document

## 2010-03-20 NOTE — Assessment & Plan Note (Signed)
Summary: STD treatment/ls  Nurse Visit   Allergies: No Known Drug Allergies  Medication Administration  Injection # 1:    Medication: Rocephin  250mg     Diagnosis: GONORRHEA (ICD-098.0)    Route: IM    Site: RUOQ gluteus    Exp Date: 04/2010    Lot #: [id number]    Mfr: Apotex    Patient tolerated injection without complications    Given by: Theresia Lo RN (April 07, 2009 2:56 PM)  Orders Added: 1)  Admin of Injection (IM/SQ) [40102] 2)  Rocephin  250mg  [V2536]   Medication Administration  Injection # 1:    Medication: Rocephin  250mg     Diagnosis: GONORRHEA (ICD-098.0)    Route: IM    Site: RUOQ gluteus    Exp Date: 04/2010    Lot #: [id number]    Mfr: Apotex    Patient tolerated injection without complications    Given by: Theresia Lo RN (April 07, 2009 2:56 PM)  Orders Added: 1)  Admin of Injection (IM/SQ) [42595] 2)  Rocephin  250mg  [G3875]   patient advised to pick up medication sent to pharmacy by Dr. Wallene Huh . advised to tell partner to be treated. [place]stein from sex for 7 days and always use condoms. patient waited in office for 20 minutes without any problems. Theresia Lo RN  April 07, 2009 3:07 PM  Appended Document: STD treatment/ls Communicable Disease form faxed to [facility] on 04/07/2009

## 2010-03-20 NOTE — Assessment & Plan Note (Signed)
Summary: vag prob,df   Vital Signs:  Patient profile:   40 year old female Weight:      193 pounds Pulse rate:   100 / minute BP sitting:   121 / 82  (right arm)  Vitals Entered By: Renato Battles slade,cma CC: vaginal d/c again after tx of std. Is Patient Diabetic? No Pain Assessment Patient in pain? yes     Location: pelvis Intensity: 3   Primary Care Provider:  Milinda Antis MD  CC:  vaginal d/c again after tx of std..  History of Present Illness: Here for test of cure, treated for [facility] and still symptomatic.  Having discharge and pelvic cramping.  Menses is due any day.  Habits & Providers  Alcohol-Tobacco-Diet     Tobacco Status: never  Allergies: No Known Drug Allergies  Physical Exam  Genitalia:  Normal introitus for age, no external lesions, no vaginal discharge, mucosa pink and moist, no vaginal or cervical lesions, no vaginal atrophy, no friaility or hemorrhage, normal uterus size and position, no adnexal masses or tenderness   Impression & Recommendations:  Problem # 1:  EXPOSURE TO VIRAL DISEASE, NEC (ICD-V01.79) + clues, treat with metronidazole for 7 d, counsled on safe sex Orders: GC/Chlamydia-[facility] (87591/87491) Wet Prep- [facility] (47425) [facility]- Est Level  3 (95638)  Complete Medication List: 1)  Bentyl 20 Mg Tabs (Dicyclomine hcl) .Marland Kitchen.. 1 by mouth qid x 7 days 2)  Acidophilus Caps (Lactobacillus) .Marland Kitchen.. 1 by mouth daily x 7-10 days 3)  Metronidazole 500 Mg Tabs (Metronidazole) .... One tab by mouth two times a day x 7 days  Patient Instructions: 1)  If you could be exposed to sexually transmitted diseases. you should use a condom.  Prescriptions: METRONIDAZOLE 500 MG TABS (METRONIDAZOLE) one tab by mouth two times a day x 7 days  #15 x 0   Entered and Authorized by:   Luretha Murphy NP   Signed by:   Luretha Murphy NP on 04/28/2009   Method used:   Electronically to        [facility] [phone]* (retail)       [address]       [place], [place]  [address]       Ph: [phone] or [phone]       Fax: [phone]   RxID:   [id number]   Laboratory Results  Date/Time Received: April 28, 2009 9:50 AM  Date/Time Reported: April 28, 2009 9:58 AM   [facility] [employer] Source: vag WBC/hpf: 1-5 Bacteria/hpf: 3+  Cocci Clue cells/hpf: many  Positive whiff Yeast/hpf: none Trichomonas/hpf: none Comments: ...............test performed by......Marland KitchenBonnie A. [place], MLS (ASCP)cm

## 2010-03-22 ENCOUNTER — Encounter: Payer: Self-pay | Admitting: *Deleted

## 2010-03-22 NOTE — Miscellaneous (Signed)
Summary: PA form  Clinical Lists Changes  PA is required for meloxicam. form placed in MD box. Theresia Lo RN  March 05, 2010 3:53 PM  Form completed Milinda Antis MD  March 06, 2010 1:29 PM  Appended Document: PA form approval received  x 1 year and pharmacy notified.
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
## 2010-03-22 NOTE — Progress Notes (Signed)
Summary: LVM  Phone Note Outgoing Call   Call placed by: Milinda Antis MD,  February 21, 2010 4:39 PM Details for Reason: X-ray results Summary of Call: Left Voice Message for pt to return call. Her neck x-ray did not show any swelling or fracture. She does have mild bulding disc in neck but this is not the cause of pain. Continue the anti-inflammatory, she can take 1 flexeril at night, if she has any urinary symptoms she should stop immediately

## 2010-03-26 ENCOUNTER — Telehealth: Payer: Self-pay | Admitting: Family Medicine

## 2010-03-26 DIAGNOSIS — B373 Candidiasis of vulva and vagina: Secondary | ICD-10-CM

## 2010-03-26 DIAGNOSIS — N898 Other specified noninflammatory disorders of vagina: Secondary | ICD-10-CM

## 2010-03-26 DIAGNOSIS — M542 Cervicalgia: Secondary | ICD-10-CM

## 2010-03-26 DIAGNOSIS — B3731 Acute candidiasis of vulva and vagina: Secondary | ICD-10-CM

## 2010-03-27 ENCOUNTER — Ambulatory Visit (INDEPENDENT_AMBULATORY_CARE_PROVIDER_SITE_OTHER): Payer: Medicaid Other | Admitting: Family Medicine

## 2010-03-27 ENCOUNTER — Encounter: Payer: Self-pay | Admitting: Family Medicine

## 2010-03-27 ENCOUNTER — Other Ambulatory Visit: Payer: Self-pay | Admitting: Family Medicine

## 2010-03-27 DIAGNOSIS — B373 Candidiasis of vulva and vagina: Secondary | ICD-10-CM | POA: Insufficient documentation

## 2010-03-27 DIAGNOSIS — B3731 Acute candidiasis of vulva and vagina: Secondary | ICD-10-CM | POA: Insufficient documentation

## 2010-03-28 ENCOUNTER — Telehealth: Payer: Self-pay | Admitting: Family Medicine

## 2010-03-28 NOTE — Telephone Encounter (Signed)
Patient was actually c/o swelling in her jaw.  Was advised to call her dentist for another antibiotic.  She states she will call them.

## 2010-03-28 NOTE — Telephone Encounter (Signed)
Patient states that her vaginal area is really swollen even though she has been taking the Diflucan as prescribed.  Wants to know what to do.

## 2010-03-28 NOTE — Telephone Encounter (Signed)
Pt did not have any swelling yesterday, just erythema and irritation. If she is able to urinate without difficulty I want her to continue the fluconzaole, she can try ice to the region as well three times a day, cotton under-wear. If she thinks the swelling is getting worse then she needs to come back in for a visit to be rechecked Note her GC/Chlamydia was negative

## 2010-04-05 NOTE — Progress Notes (Signed)
Summary: triage  Phone Note Call from Patient Call back at Home Phone [phone]   Caller: Patient Summary of Call: needs something for a really bad yeast inf.  - results of ABX.  swollen Initial call taken by: De Nurse,  March 26, 2010 9:15 AM  Follow-up for Phone Call        states she has taken 3 diflucans in a row & is as swollen as ever. "feels like a knot" wants to be seen. told her we had no appts for today & suggested UC or an appt tomorrow. she will make an appt for tomorrow Follow-up by: Golden Circle RN,  March 26, 2010 9:20 AM
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
## 2010-04-11 ENCOUNTER — Inpatient Hospital Stay (INDEPENDENT_AMBULATORY_CARE_PROVIDER_SITE_OTHER)
Admission: RE | Admit: 2010-04-11 | Discharge: 2010-04-11 | Disposition: A | Discharge status: Home / Self Care | Payer: Medicaid Other | Source: Ambulatory Visit | Attending: Family Medicine | Admitting: Family Medicine

## 2010-04-11 DIAGNOSIS — M436 Torticollis: Secondary | ICD-10-CM

## 2010-04-17 ENCOUNTER — Ambulatory Visit: Payer: Medicaid Other | Admitting: Family Medicine

## 2010-04-20 ENCOUNTER — Ambulatory Visit (INDEPENDENT_AMBULATORY_CARE_PROVIDER_SITE_OTHER): Payer: Medicaid Other | Admitting: Family Medicine

## 2010-04-20 ENCOUNTER — Encounter: Payer: Self-pay | Admitting: Family Medicine

## 2010-04-20 ENCOUNTER — Ambulatory Visit (HOSPITAL_COMMUNITY)
Admission: RE | Admit: 2010-04-20 | Discharge: 2010-04-20 | Disposition: A | Discharge status: Home / Self Care | Payer: Medicaid Other | Source: Ambulatory Visit | Attending: Internal Medicine | Admitting: Internal Medicine

## 2010-04-20 VITALS — BP 120/72 | Temp 98.2°F | Ht 64.5 in | Wt 204.0 lb

## 2010-04-20 DIAGNOSIS — M542 Cervicalgia: Secondary | ICD-10-CM

## 2010-04-20 DIAGNOSIS — I499 Cardiac arrhythmia, unspecified: Secondary | ICD-10-CM | POA: Insufficient documentation

## 2010-04-20 DIAGNOSIS — R079 Chest pain, unspecified: Secondary | ICD-10-CM | POA: Insufficient documentation

## 2010-04-20 NOTE — Assessment & Plan Note (Signed)
Atypical chest pain, likely MSK as it occurred at same time as neck pain. No red flags today, normal EKG, pt has no risk factors for CAD, family history of DM. Given reassurance today

## 2010-04-20 NOTE — Patient Instructions (Signed)
I will call you with MRI results, pending results we may start physical therapy for your neck Increase the flexeril to 10mg  as needed for spasm Use ibuprofen or Tylenol for severe pain- take with food

## 2010-04-20 NOTE — Progress Notes (Signed)
  Subjective:    Patient ID: Katelyn Brown, female    DOB: 18-Oct-1970, 40 y.o.   MRN: [id number]  HPI  Pt seen in ED 2/22 for severe neck pain, at that time prescribed Medrol dose pak and Vicodin, diagnosis of torticollis given. Pt continues to have neck pain, progressively worsening over the past 3 months Pain often starts at base of neck, radiates down to right arm more recently also causes headache, occ pain is sharp, other times it feels sore, like she has a knot in her upper back. Note pt tried Tylenol and Flexeril before going to ER however pain was not relieved Works as a Archivist  Chest pain- pt had 1 episode of chest pain at time of neck pain requiring ED visit, where she had a tight sharp pain radiate across her chest, no nausea, history of SOB at random times unsure if she was SOB at time, but thought she was having a heart-attack at the same time States this was not checked in the ED  Review of Systems per above, + occ numbness in right fingers with pain, denies dropping items     Objective:   Physical Exam   Vitals noted    GEN-  NAD, alert and oriented    Neck - Full ROM, equivical sprulings on right side, +spasm noted over trapeizus Right    CVS-  RRR, no murmur    RESP- CTAB Neuro- reflexes 2+ upper ext, sensation grossly in tact  EKG-NSR    Assessment & Plan:

## 2010-04-20 NOTE — Assessment & Plan Note (Signed)
Persistent neck pain, with episodes of severe pain and mild radicular symptoms. Will obtain MRI, discussed PT with patient if fairly normal scan, vs referral to specialist for ongoing pain. Will not start any new meds today and pt is in agreement.

## 2010-04-25 ENCOUNTER — Ambulatory Visit (HOSPITAL_COMMUNITY)
Admission: RE | Admit: 2010-04-25 | Discharge: 2010-04-25 | Disposition: A | Discharge status: Home / Self Care | Payer: Medicaid Other | Source: Ambulatory Visit | Attending: Family Medicine | Admitting: Family Medicine

## 2010-04-25 DIAGNOSIS — M542 Cervicalgia: Secondary | ICD-10-CM | POA: Insufficient documentation

## 2010-04-25 DIAGNOSIS — G8929 Other chronic pain: Secondary | ICD-10-CM | POA: Insufficient documentation

## 2010-04-26 ENCOUNTER — Telehealth: Payer: Self-pay | Admitting: Family Medicine

## 2010-04-26 DIAGNOSIS — M542 Cervicalgia: Secondary | ICD-10-CM

## 2010-04-26 NOTE — Telephone Encounter (Signed)
Discussed with pt, MRI shows minimal buldge no cord or nerve compression. Will plan to start PT, massage, mild traction for neck If no improvement then, refer to neck specialist

## 2010-05-23 ENCOUNTER — Encounter: Payer: Self-pay | Admitting: Family Medicine

## 2010-05-23 ENCOUNTER — Ambulatory Visit (INDEPENDENT_AMBULATORY_CARE_PROVIDER_SITE_OTHER): Payer: Medicaid Other | Admitting: Family Medicine

## 2010-05-23 ENCOUNTER — Telehealth: Payer: Self-pay | Admitting: Family Medicine

## 2010-05-23 ENCOUNTER — Other Ambulatory Visit (HOSPITAL_COMMUNITY)
Admission: RE | Admit: 2010-05-23 | Discharge: 2010-05-23 | Disposition: A | Discharge status: Home / Self Care | Payer: Medicaid Other | Source: Ambulatory Visit | Attending: Family Medicine | Admitting: Family Medicine

## 2010-05-23 VITALS — BP 117/81 | HR 88 | Temp 98.5°F | Ht 65.5 in | Wt 204.6 lb

## 2010-05-23 DIAGNOSIS — R300 Dysuria: Secondary | ICD-10-CM

## 2010-05-23 DIAGNOSIS — Z01419 Encounter for gynecological examination (general) (routine) without abnormal findings: Secondary | ICD-10-CM | POA: Insufficient documentation

## 2010-05-23 DIAGNOSIS — Z124 Encounter for screening for malignant neoplasm of cervix: Secondary | ICD-10-CM

## 2010-05-23 DIAGNOSIS — N760 Acute vaginitis: Secondary | ICD-10-CM

## 2010-05-23 DIAGNOSIS — Z202 Contact with and (suspected) exposure to infections with a predominantly sexual mode of transmission: Secondary | ICD-10-CM

## 2010-05-23 DIAGNOSIS — Z23 Encounter for immunization: Secondary | ICD-10-CM

## 2010-05-23 DIAGNOSIS — Z9109 Other allergy status, other than to drugs and biological substances: Secondary | ICD-10-CM

## 2010-05-23 DIAGNOSIS — K589 Irritable bowel syndrome without diarrhea: Secondary | ICD-10-CM

## 2010-05-23 DIAGNOSIS — J309 Allergic rhinitis, unspecified: Secondary | ICD-10-CM

## 2010-05-23 LAB — POCT URINALYSIS DIPSTICK
Bilirubin, UA: NEGATIVE
Blood, UA: NEGATIVE
Glucose, UA: NEGATIVE
Ketones, UA: NEGATIVE
Nitrite, UA: NEGATIVE
Spec Grav, UA: 1.025

## 2010-05-23 LAB — POCT WET PREP (WET MOUNT): Trichomonas Wet Prep HPF POC: NEGATIVE

## 2010-05-23 MED ORDER — SENNOSIDES 8.6 MG PO TABS: 1.0000 | ORAL_TABLET | Freq: Two times a day (BID) | ORAL | Status: DC | PRN

## 2010-05-23 MED ORDER — CETIRIZINE HCL 10 MG PO TABS: 10.0000 mg | ORAL_TABLET | Freq: Every day | ORAL | Status: DC | PRN

## 2010-05-23 NOTE — Patient Instructions (Signed)
Please schedule your mammogram I recommend a yearly eye visit I recommend a dental visit every year Start taking a multivitamin with calcium and Vitamin D Use the certirizine as needed for allergies We will follow-up with for Physical therapy  You will get a call with lab results from today. Use the sennakot for constipation

## 2010-05-23 NOTE — Telephone Encounter (Signed)
Discussed results of UA and wet prep- no treatment needed

## 2010-05-23 NOTE — Progress Notes (Signed)
  Subjective:    Patient ID: Katelyn Brown, female    DOB: 30-May-1970, 40 y.o.   MRN: [id number]  HPI      Here for CPE- PAP Smear, needs to schedule Mammogram, has been exposed to STD in the past, would like screening including HIV/RPR, no discharge causing discomfort       LMP 05/10/10      IBS- predominant consitpation currently- last BM 3 days ago , has tried many OTC things for constipation, even uses bowel cleansers      Allergies Rhinitis- watery eyes, sneezing, runny nose - tried zyrtec would like a script for this         Admits to urinary pressure at end of stream, occ dysuria, concerned her sex toys were causing problems because of the vibration, no incontinence        No recent illness   Review of Systems per above     Objective:   Physical Exam       GEN- NAD, alert and oriented       HEENT- PERRL, EOMI, oropharynx clear, no facial swelling       Neck- supple, no thyromegaly        Breast- no masses felt, no nipple inversion, no skin changes, no axillary nodes        CVS- RRR, no murmur       RESP- CTAB        GU- no external lesions, thin vaginal discharge no odor, cervix visualized, normal position, no cervical lesions, pink mucosa, uterus in normal position, no CMT, no ovarian masses felt        Assessment & Plan:   1.CPE- PAP SMEAR done, Pt to schedule Mammo, see instructions for dental, eye visit      STD check for exposure      No evidence of UTI     Discussed safe use of sex toys   2.Allergies- zyrtec prn    3. IBS- chronic problem, trial of senno-kot for constipation

## 2010-05-24 LAB — RPR

## 2010-05-28 ENCOUNTER — Encounter: Payer: Self-pay | Admitting: Family Medicine

## 2010-05-30 ENCOUNTER — Other Ambulatory Visit: Payer: Self-pay | Admitting: Family Medicine

## 2010-05-30 DIAGNOSIS — Z1231 Encounter for screening mammogram for malignant neoplasm of breast: Secondary | ICD-10-CM

## 2010-06-04 ENCOUNTER — Ambulatory Visit
Admission: RE | Admit: 2010-06-04 | Discharge: 2010-06-04 | Disposition: A | Discharge status: Home / Self Care | Payer: Medicaid Other | Source: Ambulatory Visit | Attending: Family Medicine | Admitting: Family Medicine

## 2010-06-04 DIAGNOSIS — Z1231 Encounter for screening mammogram for malignant neoplasm of breast: Secondary | ICD-10-CM

## 2010-06-06 ENCOUNTER — Telehealth: Payer: Self-pay | Admitting: Family Medicine

## 2010-06-06 DIAGNOSIS — M542 Cervicalgia: Secondary | ICD-10-CM

## 2010-06-06 NOTE — Telephone Encounter (Signed)
Orders for PT has expired, pt asking for new referral

## 2010-06-08 NOTE — Telephone Encounter (Signed)
Referral sent to re-certify services

## 2010-06-26 ENCOUNTER — Ambulatory Visit: Payer: Medicaid Other | Attending: Family Medicine | Admitting: Physical Therapy

## 2010-06-26 DIAGNOSIS — R293 Abnormal posture: Secondary | ICD-10-CM | POA: Insufficient documentation

## 2010-06-26 DIAGNOSIS — IMO0001 Reserved for inherently not codable concepts without codable children: Secondary | ICD-10-CM | POA: Insufficient documentation

## 2010-06-26 DIAGNOSIS — M542 Cervicalgia: Secondary | ICD-10-CM | POA: Insufficient documentation

## 2010-07-06 NOTE — Op Note (Signed)
NAMESINDIA, KOWALCZYK          ACCOUNT NO.:  [id number]   MEDICAL RECORD NO.:  [id number]          PATIENT TYPE:  AMB   LOCATION:  ENDO                         FACILITY:  [facility]   PHYSICIAN:  John C. Madilyn Fireman, M.D.    DATE OF BIRTH:  20-May-1970   DATE OF PROCEDURE:  02/13/2005  DATE OF DISCHARGE:                                 OPERATIVE REPORT   PROCEDURE:  Colonoscopy.   INDICATIONS FOR PROCEDURE:  Heme positive stool and rectal bleeding.   DESCRIPTION OF PROCEDURE:  The patient was placed in the left lateral  decubitus position and placed on the pulse monitor with continuous low-flow  oxygen delivered by nasal cannula. She was sedated with 75 mcg IV fentanyl  and 5 mg IV Versed. The Olympus video colonoscope was inserted into the  rectum and advanced to cecum, confirmed by transillumination at McBurney's  point and visualization at the ileocecal valve and appendiceal orifice. The  prep was excellent. The cecum, ascending, transverse descending sigmoid and  rectum all appeared normal with no masses, polyps, diverticula or other  mucosal abnormalities. Retroflexed view of the anus did reveal small  internal hemorrhoids with no stigma of hemorrhage. The scope was then  withdrawn and the patient returned to the recovery room in stable condition.  She tolerated the procedure well and there were no immediate complications.   IMPRESSION:  Small hemorrhoids, otherwise normal study.           ______________________________  Everardo All Madilyn Fireman, M.D.     [facility]/MEDQ  D:  02/13/2005  T:  02/13/2005  Job:  [id number]   cc:   Sibyl Parr. Darrick Penna, M.D.  Fax: [phone]

## 2010-07-10 ENCOUNTER — Ambulatory Visit: Payer: Medicaid Other | Admitting: Physical Therapy

## 2010-07-17 ENCOUNTER — Encounter: Payer: Medicaid Other | Admitting: Physical Therapy

## 2010-07-24 ENCOUNTER — Ambulatory Visit: Payer: Medicaid Other | Attending: Family Medicine | Admitting: Physical Therapy

## 2010-07-24 DIAGNOSIS — IMO0001 Reserved for inherently not codable concepts without codable children: Secondary | ICD-10-CM | POA: Insufficient documentation

## 2010-07-24 DIAGNOSIS — M542 Cervicalgia: Secondary | ICD-10-CM | POA: Insufficient documentation

## 2010-07-24 DIAGNOSIS — R293 Abnormal posture: Secondary | ICD-10-CM | POA: Insufficient documentation

## 2010-08-06 ENCOUNTER — Ambulatory Visit: Payer: Medicaid Other | Admitting: Physical Therapy

## 2010-08-15 ENCOUNTER — Ambulatory Visit (INDEPENDENT_AMBULATORY_CARE_PROVIDER_SITE_OTHER): Payer: Medicaid Other | Admitting: Family Medicine

## 2010-08-15 ENCOUNTER — Encounter: Payer: Self-pay | Admitting: Family Medicine

## 2010-08-15 VITALS — BP 122/88 | HR 89 | Temp 98.0°F | Ht 65.5 in | Wt 203.0 lb

## 2010-08-15 DIAGNOSIS — M79609 Pain in unspecified limb: Secondary | ICD-10-CM

## 2010-08-15 DIAGNOSIS — L600 Ingrowing nail: Secondary | ICD-10-CM

## 2010-08-15 DIAGNOSIS — M79673 Pain in unspecified foot: Secondary | ICD-10-CM

## 2010-08-15 NOTE — Progress Notes (Signed)
  Subjective:    Patient ID: Katelyn Brown, female    DOB: [birth year], 40 y.o.   MRN: [id number]  HPI  Bilateral foot pain over top and side arches for months, works as a Producer, television/film/video, has tried [employer] and inserts in shoes which help some, occ has leg swelling after shifts, also has ingrown nails she wants removed. Has pain in high heels feels her arch is too high   Minimal exercise  Review of Systems per above     Objective:   Physical Exam        GEN- NAD, alert and oriented        Ext- no edema, Feet TTP over metatarsal/transverse arch, mild callus formation at metatarsal fat pad and heel, no bunions, no corns seen.     Slight high arch standing.         Nails- unable to appreciate any infected or inflammed in grown nails, she also has false nails on her toes today   Assessment & Plan:    Foot pain- Her pain is most likely from poor arch support and standing. I think she would benefit from orthotics as she has used OTC inserts. Will try to send her to podiatry or [facility] for this.    INgrown nails- no infected or inflammed nails today, could not visualize pt native nail with decorative nails on top.

## 2010-08-15 NOTE — Patient Instructions (Signed)
I will send a referral to the podiatrist  For your arches and  Use a tennis ball for your arches Use inserts for your shoe

## 2010-08-15 NOTE — Progress Notes (Signed)
  Subjective:    Patient ID: Katelyn Brown, female    DOB: June 07, 1970, 40 y.o.   MRN: [id number]  HPI        Review of Systems     Objective:   Physical Exam        Assessment & Plan:

## 2010-09-08 ENCOUNTER — Emergency Department (HOSPITAL_COMMUNITY)
Admission: EM | Admit: 2010-09-08 | Discharge: 2010-09-08 | Disposition: A | Discharge status: Home / Self Care | Payer: Medicaid Other | Attending: Emergency Medicine | Admitting: Emergency Medicine

## 2010-09-08 DIAGNOSIS — I10 Essential (primary) hypertension: Secondary | ICD-10-CM | POA: Insufficient documentation

## 2010-09-08 DIAGNOSIS — R110 Nausea: Secondary | ICD-10-CM | POA: Insufficient documentation

## 2010-09-08 DIAGNOSIS — H81399 Other peripheral vertigo, unspecified ear: Secondary | ICD-10-CM | POA: Insufficient documentation

## 2010-09-08 LAB — URINALYSIS, ROUTINE W REFLEX MICROSCOPIC
Nitrite: NEGATIVE
Specific Gravity, Urine: 1.015 (ref 1.005–1.030)
Urobilinogen, UA: 1 mg/dL (ref 0.0–1.0)

## 2010-09-08 LAB — POCT I-STAT, CHEM 8
Chloride: 106 mEq/L (ref 96–112)
Creatinine, Ser: 0.6 mg/dL (ref 0.50–1.10)
HCT: 40 % (ref 36.0–46.0)
Hemoglobin: 13.6 g/dL (ref 12.0–15.0)
Potassium: 3.8 mEq/L (ref 3.5–5.1)
Sodium: 142 mEq/L (ref 135–145)

## 2010-09-08 LAB — PREGNANCY, URINE: Preg Test, Ur: NEGATIVE

## 2010-09-17 ENCOUNTER — Encounter: Payer: Self-pay | Admitting: Podiatrist

## 2010-10-15 ENCOUNTER — Ambulatory Visit: Payer: Medicaid Other | Admitting: Family Medicine

## 2010-10-29 ENCOUNTER — Encounter: Payer: Self-pay | Admitting: Family Medicine

## 2010-10-29 ENCOUNTER — Ambulatory Visit (INDEPENDENT_AMBULATORY_CARE_PROVIDER_SITE_OTHER): Payer: Medicaid Other | Admitting: Family Medicine

## 2010-10-29 VITALS — BP 124/84 | HR 89 | Temp 98.2°F | Ht 65.5 in | Wt 203.5 lb

## 2010-10-29 DIAGNOSIS — R42 Dizziness and giddiness: Secondary | ICD-10-CM

## 2010-10-29 DIAGNOSIS — F339 Major depressive disorder, recurrent, unspecified: Secondary | ICD-10-CM

## 2010-10-29 LAB — TSH: TSH: 1.106 u[IU]/mL (ref 0.350–4.500)

## 2010-10-30 MED ORDER — DIMENHYDRINATE 50 MG PO TABS: 25.0000 mg | ORAL_TABLET | Freq: Three times a day (TID) | ORAL | Status: AC | PRN

## 2010-10-30 NOTE — Patient Instructions (Addendum)
It has been a pleasure to meet you! Please take dimenhydrinate 25 mg 1 tab  at night.  I will call yo with labs results if they come back abnormal. If not I will send yo a letter. Make a f/u appointment if your symptoms worsen or new ones appear and also to evaluate your level of energy and review in more depth the questionnaire you filled today.

## 2010-10-30 NOTE — Assessment & Plan Note (Addendum)
Recurrent dizziness with her Hx and age group most likely cervical spine disease but in our differential we also consider Hypothyroidism, Benign Positional Vertigo, Meniere and Migraine. Depression is also in our differential as concomitant.   Plan: Dimenhydrinate 25 mg QHS TSH.  Depression assessment and treatment.

## 2010-10-30 NOTE — Progress Notes (Signed)
  Subjective:    Patient ID: Katelyn Brown, female    DOB: [birth year], 40 y.o.   MRN: [id number]  HPI Pt comes c/o dizziness that she has had since 2000 but last year it has been worsening to the point of going to ED last moth. This episodes of dizziness are intermittent and she states they are worse in the morning, in the evening, and when she changes to stand up position. She describes them as feeling dizzy, unstable and  with decreased level of energy. They do not modify with any particular head/neck movement or medication. They are  associated with nausea but not vomiting. Also associated with headache and blurred vision. No changes in the acuity of her hearing after the episodes but c/o some kind of ringing on the left ear intermittent that seems to be increased at night. No ear pain. Afebrile, no changes in her urinary frequency or appetite.  She has Hx of IBS and constipation is frequent symptom. No chest pain, palpitations or SOB. Has Hx and MRI + on 03/12 for minimal to mild degenerative changes without significant spinal stenosis or cord compression. Pt goes to PT Has Hx of depression PHQ-9 on this visit was scored 16, with 0 point given to suicidal ideation item. No on current medications for depression.   Review of Systems   Please review HPI Objective:   Physical Exam  Constitutional: She is oriented to person, place, and time. She appears well-developed. No distress.  HENT:  Head: Normocephalic and atraumatic.  Right Ear: External ear normal.  Left Ear: External ear normal.       Normal ear canal and normal TM bilaterally.  Eyes: EOM are normal. Pupils are equal, round, and reactive to light.  Neck: Normal range of motion. Neck supple. No thyromegaly present.  Cardiovascular: Normal rate, regular rhythm and normal heart sounds.   No murmur heard.      BP supine/sitting /standing with no more than 10 mmHg difference.  Pulmonary/Chest: Breath sounds normal.  Abdominal:  Soft. Bowel sounds are normal. She exhibits no distension. There is no tenderness.  Musculoskeletal: Normal range of motion. She exhibits no edema.  Neurological: She is alert and oriented to person, place, and time. No cranial nerve deficit. She exhibits normal muscle tone. Coordination normal.       No gait disturbances. Dix Hallpike maneuver negative. No nistagmus presents.    Psychiatric: She has a normal mood and affect. Her behavior is normal. Judgment and thought content normal.          Assessment & Plan:

## 2010-11-04 NOTE — Assessment & Plan Note (Signed)
PHQ-9 scored 16 with 0 on suicidal ideation or plans.  Plan:  Schedule an appointment to evaluate this problem more in detail.

## 2011-02-19 ENCOUNTER — Emergency Department (HOSPITAL_COMMUNITY): Payer: Medicaid Other

## 2011-02-19 ENCOUNTER — Encounter (HOSPITAL_COMMUNITY): Payer: Self-pay | Admitting: Nurse Practitioner

## 2011-02-19 ENCOUNTER — Emergency Department (HOSPITAL_COMMUNITY)
Admission: EM | Admit: 2011-02-19 | Discharge: 2011-02-19 | Disposition: A | Discharge status: Home / Self Care | Payer: Medicaid Other | Attending: Emergency Medicine | Admitting: Emergency Medicine

## 2011-02-19 DIAGNOSIS — K089 Disorder of teeth and supporting structures, unspecified: Secondary | ICD-10-CM | POA: Insufficient documentation

## 2011-02-19 DIAGNOSIS — R220 Localized swelling, mass and lump, head: Secondary | ICD-10-CM | POA: Insufficient documentation

## 2011-02-19 DIAGNOSIS — L089 Local infection of the skin and subcutaneous tissue, unspecified: Secondary | ICD-10-CM

## 2011-02-19 DIAGNOSIS — K589 Irritable bowel syndrome without diarrhea: Secondary | ICD-10-CM | POA: Insufficient documentation

## 2011-02-19 DIAGNOSIS — B999 Unspecified infectious disease: Secondary | ICD-10-CM | POA: Insufficient documentation

## 2011-02-19 DIAGNOSIS — R6884 Jaw pain: Secondary | ICD-10-CM | POA: Insufficient documentation

## 2011-02-19 LAB — POCT I-STAT, CHEM 8
Calcium, Ion: 1.15 mmol/L (ref 1.12–1.32)
Glucose, Bld: 85 mg/dL (ref 70–99)
HCT: 46 % (ref 36.0–46.0)
Hemoglobin: 15.6 g/dL — ABNORMAL HIGH (ref 12.0–15.0)
TCO2: 25 mmol/L (ref 0–100)

## 2011-02-19 MED ORDER — OXYCODONE-ACETAMINOPHEN 5-325 MG PO TABS: 1.0000 | ORAL_TABLET | Freq: Four times a day (QID) | ORAL | Status: AC | PRN

## 2011-02-19 MED ORDER — HYDROMORPHONE HCL PF 1 MG/ML IJ SOLN
1.0000 mg | Freq: Once | INTRAMUSCULAR | Status: AC
Start: 2011-02-19 — End: 2011-02-19
  Administered 2011-02-19: 1 mg via INTRAVENOUS
  Filled 2011-02-19: qty 1

## 2011-02-19 MED ORDER — IOHEXOL 300 MG/ML  SOLN
80.0000 mL | Freq: Once | INTRAMUSCULAR | Status: AC | PRN
  Administered 2011-02-19: 80 mL via INTRAVENOUS

## 2011-02-19 MED ORDER — HYDROMORPHONE HCL PF 1 MG/ML IJ SOLN
1.0000 mg | Freq: Once | INTRAMUSCULAR | Status: AC
  Administered 2011-02-19: 1 mg via INTRAVENOUS
  Filled 2011-02-19: qty 1

## 2011-02-19 MED ORDER — ONDANSETRON HCL 4 MG/2ML IJ SOLN
4.0000 mg | Freq: Once | INTRAMUSCULAR | Status: AC
  Administered 2011-02-19: 4 mg via INTRAVENOUS
  Filled 2011-02-19: qty 2

## 2011-02-19 MED ORDER — CLINDAMYCIN HCL 150 MG PO CAPS: 150.0000 mg | ORAL_CAPSULE | Freq: Four times a day (QID) | ORAL | Status: AC

## 2011-02-19 NOTE — ED Notes (Signed)
Reports she has been on several courses of antibiotics for tooth infection over past month.This am woke with L sided face/mouth/cheek swelling and pain. Took aleve with some pain relief

## 2011-02-19 NOTE — ED Notes (Signed)
Per CT - department is working on holiday schedule and a machine is a down. Informed MD and patient
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
## 2011-02-20 ENCOUNTER — Encounter: Payer: Self-pay | Admitting: Family Medicine

## 2011-02-20 ENCOUNTER — Ambulatory Visit (INDEPENDENT_AMBULATORY_CARE_PROVIDER_SITE_OTHER): Payer: Medicaid Other | Admitting: Family Medicine

## 2011-02-20 DIAGNOSIS — K0889 Other specified disorders of teeth and supporting structures: Secondary | ICD-10-CM | POA: Insufficient documentation

## 2011-02-20 DIAGNOSIS — K089 Disorder of teeth and supporting structures, unspecified: Secondary | ICD-10-CM

## 2011-02-20 NOTE — Progress Notes (Signed)
  Subjective:    Patient ID: Katelyn Brown, female    DOB: [birth year], 41 y.o.   MRN: [id number]  HPI 41 yo here for follow-up of dental pain seen in ER last night  Has history of months long dental infection in right upper tooth, was treated for the past month by a dentist with augmentin- plans for extraction.  Went to ER Jan 1 for 1 day of worsening swelling, got CT with contrast which shows odontogenic cyst affecting tooth 12/13.  No deep space infection or abscess.  Continued to have pain and swelling.  Plans on starting percocet and clindamycin Rx'ed by ER today.  She is reluctant to return to same dentist.  Review of Systemssee HPI.  No fever, chills, dysphagia, nausea, rhinorrhea    Objective:   Physical Exam  GEN: Alert & Oriented, No acute distress HEENT: [place]/AT. EOMI, PERRLA, no conjunctival injection or scleral icterus.  Bilateral tympanic membranes intact without erythema or effusion.  .  Nares without edema or rhinorrhea.  Oropharynx is without erythema or exudates.  No anterior or posterior cervical lymphadenopathy. Face:  Tender to palpation with swelling over left cheek CV:  Regular Rate & Rhythm, no murmur Respiratory:  Normal work of breathing, CTAB       Assessment & Plan:

## 2011-02-20 NOTE — Patient Instructions (Signed)
Make follow-up ASAP with dentist/oral surgeon Seek medical care if fever, trouble swallowing, or worsening infection

## 2011-02-20 NOTE — Assessment & Plan Note (Signed)
Pain and swelling likely from infected odontogenic cyst of months duration.  No evidence of deep space infection on CT.  Gave her list of medicaid dentists if she chooses to switch.   Will continue clindamycin she got in ER, advised to contact dentist ASAP for definitive treatment.

## 2011-03-18 ENCOUNTER — Ambulatory Visit (INDEPENDENT_AMBULATORY_CARE_PROVIDER_SITE_OTHER): Payer: Medicaid Other | Admitting: Family Medicine

## 2011-03-18 ENCOUNTER — Encounter: Payer: Self-pay | Admitting: Family Medicine

## 2011-03-18 DIAGNOSIS — K0889 Other specified disorders of teeth and supporting structures: Secondary | ICD-10-CM

## 2011-03-18 DIAGNOSIS — K089 Disorder of teeth and supporting structures, unspecified: Secondary | ICD-10-CM

## 2011-03-18 DIAGNOSIS — F339 Major depressive disorder, recurrent, unspecified: Secondary | ICD-10-CM

## 2011-03-19 NOTE — Patient Instructions (Signed)
I am glad your dental problem is resolved and the positive impact this has had for you. Please make an appointment in 2-3 months to talk about your other medical problems or sooner if needed.

## 2011-03-19 NOTE — Progress Notes (Signed)
  Subjective:    Patient ID: Katelyn Brown, female    DOB: 1970/02/25, 41 y.o.   MRN: [id number]  HPI  Pt that comes for f/u after having surgery due to odontogenic cyst found in recent CT. The surgery was on Jan 3rd /2012 and she already has had two follow up visits with satisfactory results s/p dental procedure. She has had 2 cycles of antibiotic and she is afebrile and pain is 0/10. She has mild remnant suborbital ecchymosis, that is resolving. She has no complaints at this visit.  Review of Systems  Constitutional: Negative.   HENT: Negative.   Eyes: Negative.   Respiratory: Negative.   Cardiovascular: Negative.   Gastrointestinal: Negative.   Genitourinary: Negative.   Musculoskeletal: Negative.   Neurological: Negative.   Psychiatric/Behavioral: Negative.    no symptoms of depression reported.     Objective:   Physical Exam  Constitutional: She is oriented to person, place, and time. No distress.  HENT:  Mouth/Throat: Oropharynx is clear and moist.  Eyes: Conjunctivae and EOM are normal. Pupils are equal, round, and reactive to light.  Neck: Neck supple.  Cardiovascular: Normal rate, regular rhythm and normal heart sounds.   No murmur heard. Pulmonary/Chest: Effort normal and breath sounds normal. No respiratory distress.  Abdominal: Soft. Bowel sounds are normal. There is no tenderness.  Musculoskeletal: She exhibits no edema.  Lymphadenopathy:    She has no cervical adenopathy.  Neurological: She is alert and oriented to person, place, and time.       No signs of focalization  Skin: Skin is warm and dry.  Psychiatric: She has a normal mood and affect. Her behavior is normal. Judgment and thought content normal.          Assessment & Plan:

## 2011-03-19 NOTE — Assessment & Plan Note (Addendum)
Patient has no symptom symptoms of anhedonia, lack of energy, sleep disturbances, mood change nor feeling sad. She seems appropriate and cooperative. Denies any suicidal or homicidal ideations. Patient is not under any antidepressant medication.  Plan: Reevaluate on next visit planned in 2-3 months, or sooner if needed.

## 2011-03-19 NOTE — Assessment & Plan Note (Signed)
Resolved after surgery of odontogenic cyst.

## 2011-04-25 ENCOUNTER — Telehealth: Payer: Self-pay | Admitting: *Deleted

## 2011-04-25 ENCOUNTER — Encounter: Payer: Self-pay | Admitting: Family Medicine

## 2011-04-25 ENCOUNTER — Other Ambulatory Visit (HOSPITAL_COMMUNITY)
Admission: RE | Admit: 2011-04-25 | Discharge: 2011-04-25 | Disposition: A | Discharge status: Home / Self Care | Payer: Medicaid Other | Source: Ambulatory Visit | Attending: Family Medicine | Admitting: Family Medicine

## 2011-04-25 ENCOUNTER — Ambulatory Visit (INDEPENDENT_AMBULATORY_CARE_PROVIDER_SITE_OTHER): Payer: Medicaid Other | Admitting: Family Medicine

## 2011-04-25 VITALS — BP 119/83 | HR 71 | Temp 98.3°F | Ht 65.0 in | Wt 210.8 lb

## 2011-04-25 DIAGNOSIS — R300 Dysuria: Secondary | ICD-10-CM

## 2011-04-25 DIAGNOSIS — R350 Frequency of micturition: Secondary | ICD-10-CM

## 2011-04-25 DIAGNOSIS — R3989 Other symptoms and signs involving the genitourinary system: Secondary | ICD-10-CM

## 2011-04-25 DIAGNOSIS — A499 Bacterial infection, unspecified: Secondary | ICD-10-CM

## 2011-04-25 DIAGNOSIS — N760 Acute vaginitis: Secondary | ICD-10-CM

## 2011-04-25 DIAGNOSIS — B9689 Other specified bacterial agents as the cause of diseases classified elsewhere: Secondary | ICD-10-CM | POA: Insufficient documentation

## 2011-04-25 DIAGNOSIS — R399 Unspecified symptoms and signs involving the genitourinary system: Secondary | ICD-10-CM | POA: Insufficient documentation

## 2011-04-25 DIAGNOSIS — Z113 Encounter for screening for infections with a predominantly sexual mode of transmission: Secondary | ICD-10-CM | POA: Insufficient documentation

## 2011-04-25 LAB — POCT WET PREP (WET MOUNT)

## 2011-04-25 LAB — POCT URINALYSIS DIPSTICK
Blood, UA: NEGATIVE
Nitrite, UA: NEGATIVE
Urobilinogen, UA: 0.2
pH, UA: 5.5

## 2011-04-25 MED ORDER — METRONIDAZOLE 500 MG PO TABS: 500.0000 mg | ORAL_TABLET | Freq: Two times a day (BID) | ORAL | Status: AC

## 2011-04-25 NOTE — Assessment & Plan Note (Signed)
Metronidazole bid x 7

## 2011-04-25 NOTE — Telephone Encounter (Signed)
informed patient of below 

## 2011-04-25 NOTE — Assessment & Plan Note (Signed)
Given history, most clinically consistent with painful bladder syndrome/ interstitial cystitis.  Wet prep/cervical cultures collected today.  U/A unremarkable, a1c wnl.  Will benefit from a multifaceted approach including addressing depression which I discussed with her.  Main symptoms being pelvic pain, frequent urination, and stress incontinence.  Will start with behavioral modification.  She denies any diuretics/caffeine/alcohol use.  Discussed adequate generous hydration with water during the day, stopping 2 hours before bedtime.  Will keep a fluid/urination log and gave info on kegel exercises.  Will follow-up with PCP to discuss further evaluation and treatment of comorbidities.

## 2011-04-25 NOTE — Patient Instructions (Signed)
I think your pelvic pain and bladder symptoms may be due to painful bladder syndrome/interstitial cystitis  We need to do some tests to rule out other causes for your symptoms like infection or diabetes  We know that maintaining a healthy weight, daily exercise, and making sure depression is adequately treated can help with symptoms.  Please fill out voiding diary, see info on pelvic exercises  Make appointment with your PCP 2 weeks or your next convenience

## 2011-04-25 NOTE — Telephone Encounter (Signed)
Message copied by Farrell Ours on Thu Apr 25, 2011  3:55 PM ------      Message from: Macy Mis      Created: Thu Apr 25, 2011  3:43 PM       Please inform patient i sent metronidazole to pharmacy for tx of bacterial vaginosis

## 2011-04-25 NOTE — Progress Notes (Signed)
  Subjective:    Patient ID: Katelyn Brown, female    DOB: May 11, 1970, 41 y.o.   MRN: [id number]  HPIWork in Appt for dysuria.  Upon further history patient states symptoms have been occuring for over a year.  Describes symptoms of urinary frequency occurs in day but worse at night having to wake up several times each night.  No burning pain with urination, but feels pelvic pressure with urination.  Notes occasional stress incontinence when laughing or sneezing, small amount occurs several times per month- highly distressing to patient.  Reports dyspareunia- described as deep pressure during intercourse.  I have reviewed patient's  PMH, FH, and Social history and Medications as related to this visit. Significant for untreated IBS and depression Review of Systems See hpi    Objective:   Physical Exam GEN: Alert & Oriented, No acute distress CV:  Regular Rate & Rhythm, no murmur Respiratory:  Normal work of breathing, CTAB Abd:  + BS, soft, no tenderness to palpation Ext: no pre-tibial edema Pelvic Exam: no obvious cystocele.        External: normal female genitalia without lesions or masses        Vagina: normal without lesions or masses        Cervix: normal without lesions or masses        Adnexa: normal bimanual exam without masses or fullness        Uterus: normal by palpation        Samples for Wet prep, GC/Chlamydia obtained        Assessment & Plan:

## 2011-04-26 ENCOUNTER — Encounter: Payer: Self-pay | Admitting: Family Medicine

## 2011-06-13 ENCOUNTER — Other Ambulatory Visit: Payer: Self-pay | Admitting: Family Medicine

## 2011-06-13 DIAGNOSIS — Z1231 Encounter for screening mammogram for malignant neoplasm of breast: Secondary | ICD-10-CM

## 2011-06-17 ENCOUNTER — Ambulatory Visit (INDEPENDENT_AMBULATORY_CARE_PROVIDER_SITE_OTHER): Payer: Medicaid Other | Admitting: Family Medicine

## 2011-06-17 ENCOUNTER — Other Ambulatory Visit: Payer: Self-pay | Admitting: Family Medicine

## 2011-06-17 ENCOUNTER — Encounter: Payer: Self-pay | Admitting: Family Medicine

## 2011-06-17 VITALS — BP 119/79 | HR 101 | Temp 100.0°F | Ht 65.0 in | Wt 206.0 lb

## 2011-06-17 DIAGNOSIS — J029 Acute pharyngitis, unspecified: Secondary | ICD-10-CM

## 2011-06-17 LAB — POCT RAPID STREP A (OFFICE): Rapid Strep A Screen: NEGATIVE

## 2011-06-17 MED ORDER — AMOXICILLIN-POT CLAVULANATE 875-125 MG PO TABS: 1.0000 | ORAL_TABLET | Freq: Two times a day (BID) | ORAL | Status: AC

## 2011-06-17 MED ORDER — HYDROCODONE-ACETAMINOPHEN 5-500 MG PO TABS: 1.0000 | ORAL_TABLET | Freq: Three times a day (TID) | ORAL | Status: AC | PRN

## 2011-06-17 MED ORDER — IBUPROFEN 600 MG PO TABS: 600.0000 mg | ORAL_TABLET | Freq: Three times a day (TID) | ORAL | Status: AC | PRN

## 2011-06-17 NOTE — Progress Notes (Signed)
Subjective:     Patient ID: Katelyn Brown, female   DOB: 21-Nov-1970, 41 y.o.   MRN: [id number]  HPI 1 day hx of sore throat, body aches, HA/back pain, nausea, and subjective fevers. No vomiting. She has been unable to take in solid food due to throat pain. Is tolerating fluids well. Took 6 tabs of amoxicillin 500mg  yesterday. Also took 1 ibuprofen and 1 alleve, which did not address pain.   No sick contacts. Did not get flu shot.   Review of Systems     Objective:   Physical Exam Gen: lying in bed, teary, some distress.  HEENT: NCAT, mmm. Impressive R tonsillar gross pink/white exudate with erythema and edema. TMs clear. Sclera clear. Extremely tender R LAD. No appreciable LAD on L neck.  CV: RRR, no m/r/g Lungs: CTAB, no w/c Ext: no c/e    Assessment:         Plan:     1 day hx of subj fevers, myalgias, and very impressive tonsillar exudate. Fits 4/4 centor criteria, however rapid strep is negative. Note that she did self-medicate with amoxicillin. Sxs did not remit much. Ddx: likely still strep. Considering peritonsillar abscess, GC/chalmy of throat. Will swab for culture. In the meantime, tx with augmentin. Ibuprofen for pain. She will follow up tomorrow for reassessment. Encouraged hydration.

## 2011-06-17 NOTE — Assessment & Plan Note (Signed)
Acute right sided tonsil with exudate and swelling. Rapid strep neg, will order culture and assess for GC/CHlamydia.  Will treat with Augmentin have her return tomorrow.  Will monitor for improvement vs signs of forming abscess.

## 2011-06-17 NOTE — Progress Notes (Signed)
  Subjective:    Patient ID: Katelyn Brown, female    DOB: 03-Aug-1970, 41 y.o.   MRN: [id number]  HPISeen with MS3  41 yo here for work in appt for sore throat.  1 day, acute onset, with fever, myalgias, fatigue.  No emesis, diarrhea, congestion, cough.  Took 6 amoxicillin she had leftover yesterday.  Taking OTC aleve, ibuprofen without pain.  Hurts to swallow, drinking liquids ok. Review of Systems See HPI    Objective:   Physical Exam GEN: Alert & Oriented, No acute distress HEENT: [place]/AT. EOMI, PERRLA, no conjunctival injection or scleral icterus.  Bilateral tympanic membranes intact without erythema or effusion.  .  Nares without edema or rhinorrhea.  .  No anterior or posterior cervical lymphadenopathy. Oropharynx is with erythema and right sided exudate, swelling.  No midline shift.  Able to open mouth fully. No sign lymphadenopathy. CV:  Regular Rate & Rhythm, no murmur Respiratory:  Normal work of breathing, CTAB        Assessment & Plan:

## 2011-06-17 NOTE — Patient Instructions (Signed)
Make appointment for tomorrow  Augmentin (antibiotic)  Ibuprofen for pain  Hydrocodone for severe pain  If you have worsening pain, fever, seek medical care

## 2011-06-18 ENCOUNTER — Ambulatory Visit: Payer: Medicaid Other | Admitting: Family Medicine

## 2011-06-18 ENCOUNTER — Ambulatory Visit (INDEPENDENT_AMBULATORY_CARE_PROVIDER_SITE_OTHER): Payer: Medicaid Other | Admitting: Family Medicine

## 2011-06-18 ENCOUNTER — Encounter: Payer: Self-pay | Admitting: Family Medicine

## 2011-06-18 VITALS — BP 112/79 | HR 83 | Temp 98.5°F | Ht 65.0 in | Wt 207.0 lb

## 2011-06-18 DIAGNOSIS — J029 Acute pharyngitis, unspecified: Secondary | ICD-10-CM

## 2011-06-18 MED ORDER — LIDOCAINE VISCOUS 2 % MT SOLN: 20.0000 mL | OROMUCOSAL | Status: AC | PRN

## 2011-06-18 MED ORDER — HYDROMORPHONE HCL 2 MG PO TABS: 2.0000 mg | ORAL_TABLET | ORAL | Status: AC | PRN

## 2011-06-18 MED ORDER — AMOXICILLIN 875 MG PO TABS: 875.0000 mg | ORAL_TABLET | Freq: Two times a day (BID) | ORAL | Status: AC

## 2011-06-18 NOTE — Patient Instructions (Signed)

## 2011-06-18 NOTE — Assessment & Plan Note (Addendum)
No evidence of tonsillar abscess.  Negative strep with no real response to an appropriate antibiotic.  Likely viral.  Trial of Dilaudid + viscous lidocaine for pain.  Have given her an rx for amoxil, but doubt.  Reviewed expected course, symptomatic treatment.

## 2011-06-18 NOTE — Progress Notes (Signed)
  Subjective:    Patient ID: Katelyn Brown, female    DOB: 1970-10-20, 41 y.o.   MRN: [id number]  HPI Returns today.  No significant improvement with augmentin.  Went back to amoxil.  Less fever. Strep culture is not back.  Able to swallow saliva.  No real improvement with Abx.   Review of Systems  Constitutional: Positive for fever and fatigue.  Respiratory: Negative for shortness of breath, wheezing and stridor.   Cardiovascular: Negative for chest pain.  Gastrointestinal: Negative for nausea, vomiting, abdominal pain and diarrhea.       Objective:   Physical Exam  Vitals reviewed. Constitutional: She appears well-developed and well-nourished. She appears distressed.       tearful  HENT:  Head: Normocephalic and atraumatic.  Mouth/Throat: No uvula swelling. Oropharyngeal exudate present. No tonsillar abscesses.       Right > left tonsillar swelling.  No true tinnismis, no palatal swelling.  Lymphadenopathy:    She has cervical adenopathy.          Assessment & Plan:

## 2011-06-21 LAB — CULTURE, GROUP A STREP: Organism ID, Bacteria: NORMAL

## 2011-06-24 ENCOUNTER — Ambulatory Visit
Admission: RE | Admit: 2011-06-24 | Discharge: 2011-06-24 | Disposition: A | Discharge status: Home / Self Care | Payer: Medicaid Other | Source: Ambulatory Visit | Attending: Family Medicine | Admitting: Family Medicine

## 2011-06-24 DIAGNOSIS — Z1231 Encounter for screening mammogram for malignant neoplasm of breast: Secondary | ICD-10-CM

## 2011-07-26 ENCOUNTER — Encounter: Payer: Self-pay | Admitting: Family Medicine

## 2011-07-26 ENCOUNTER — Ambulatory Visit (INDEPENDENT_AMBULATORY_CARE_PROVIDER_SITE_OTHER): Payer: Medicaid Other | Admitting: Family Medicine

## 2011-07-26 VITALS — BP 128/90 | HR 76 | Temp 98.0°F | Ht 65.0 in | Wt 210.4 lb

## 2011-07-26 DIAGNOSIS — M542 Cervicalgia: Secondary | ICD-10-CM

## 2011-07-26 DIAGNOSIS — J029 Acute pharyngitis, unspecified: Secondary | ICD-10-CM

## 2011-07-26 DIAGNOSIS — J302 Other seasonal allergic rhinitis: Secondary | ICD-10-CM

## 2011-07-26 DIAGNOSIS — J309 Allergic rhinitis, unspecified: Secondary | ICD-10-CM

## 2011-07-26 HISTORY — DX: Other seasonal allergic rhinitis: J30.2

## 2011-07-26 LAB — CBC WITH DIFFERENTIAL/PLATELET
Basophils Absolute: 0.1 10*3/uL (ref 0.0–0.1)
Eosinophils Relative: 1 % (ref 0–5)
HCT: 37.5 % (ref 36.0–46.0)
Lymphocytes Relative: 30 % (ref 12–46)
MCV: 87.4 fL (ref 78.0–100.0)
Monocytes Absolute: 0.6 10*3/uL (ref 0.1–1.0)
Monocytes Relative: 5 % (ref 3–12)
RDW: 14.8 % (ref 11.5–15.5)
WBC: 13.9 10*3/uL — ABNORMAL HIGH (ref 4.0–10.5)

## 2011-07-26 MED ORDER — CETIRIZINE HCL 10 MG PO TABS: 10.0000 mg | ORAL_TABLET | Freq: Every day | ORAL | Status: DC

## 2011-07-26 MED ORDER — FLUTICASONE PROPIONATE 50 MCG/ACT NA SUSP: 2.0000 | Freq: Every day | NASAL | Status: DC

## 2011-07-30 NOTE — Assessment & Plan Note (Addendum)
Negative tests: strep, gonococcal, chlamydia (Pt was self medicating with left over Amoxicillin) No CBC to evaluate WBC. Completed course of Augmentin. Complains of burning, right mastoid pain/tenderness and referred intermittent hoarseness. Afebrile. Non smoker. Right anterior cervical adenopathy found Pt had recent maxillofacial surgery (Jan/13) for infected cyst. Plan: CBC to evaluated WBC if elevated consider CT of upper neck to r/u retropharyngeal abscess. Will check TSH since pt has enlarged thyroid on Physical Exam.

## 2011-07-30 NOTE — Patient Instructions (Signed)
It has been a pleasure to see you today. I will call you with the labs results if they come back abnormal otherwise you will receive a letter. Make your next appointment after the CT scan is performed to discuss results.

## 2011-07-30 NOTE — Progress Notes (Signed)
  Subjective:    Patient ID: Katelyn Brown, female    DOB: 08-05-1970, 41 y.o.   MRN: [id number]  HPI Pt comes today to f/o her recent sore throat. She took full course of Augmentin and still doesn't feel completely healed. She complaints of burning sensation in her throat and pain on the right anterior sided that irradiated to the mastoids area. She complaints also of mild ear ache, no ear drainage. No fevers. Pt does not have difficulty swallowing, but complaints about intermittent hoarseness. She is non smoker and it is not exposed to hazardous fumes. Denies changes in appetite or weight. No nausea or vomiting.    Review of Systems  Constitutional: Negative for fever and chills.  HENT: Positive for sore throat, dental problem and voice change. Negative for congestion, rhinorrhea, trouble swallowing, postnasal drip and sinus pressure.   Eyes: Negative.   Respiratory: Negative for cough, choking and shortness of breath.   Cardiovascular: Negative.   Gastrointestinal: Negative.   Genitourinary: Negative.   Skin: Negative.   Neurological: Negative for dizziness, light-headedness and headaches.  Hematological: Positive for adenopathy.       Pt has noticed anterior cervical adenopathy.  Psychiatric/Behavioral: Negative.        Objective:   Physical Exam  Constitutional: She is oriented to person, place, and time.  HENT:  Right Ear: External ear normal.  Left Ear: External ear normal.  Mouth/Throat: Oropharynx is clear and moist. No oropharyngeal exudate.       Mild erythema. Hypertrophic tonsils bilaterally. No asymmetry. Uvula central.   Eyes: Conjunctivae and EOM are normal. Pupils are equal, round, and reactive to light.  Neck: Neck supple. Thyromegaly present.       Anterior right. About 3cm diameter. Tender to palpation, mobile. No adhered to deep planes. Homogeneous thyromegaly. No tender to palpation.   Cardiovascular: Normal rate, regular rhythm and normal heart sounds.     Pulmonary/Chest: Effort normal and breath sounds normal.  Abdominal: Soft. Bowel sounds are normal.  Musculoskeletal: She exhibits no edema.  Lymphadenopathy:    She has cervical adenopathy.  Neurological: She is alert and oriented to person, place, and time. She has normal reflexes. No cranial nerve deficit.          Assessment & Plan:

## 2011-08-02 ENCOUNTER — Other Ambulatory Visit (HOSPITAL_COMMUNITY): Payer: Medicaid Other

## 2011-08-05 ENCOUNTER — Other Ambulatory Visit: Payer: Self-pay | Admitting: Family Medicine

## 2011-08-05 ENCOUNTER — Ambulatory Visit (HOSPITAL_COMMUNITY)
Admission: RE | Admit: 2011-08-05 | Discharge: 2011-08-05 | Disposition: A | Discharge status: Home / Self Care | Payer: Medicaid Other | Source: Ambulatory Visit | Attending: Family Medicine | Admitting: Family Medicine

## 2011-08-05 DIAGNOSIS — J029 Acute pharyngitis, unspecified: Secondary | ICD-10-CM | POA: Insufficient documentation

## 2011-08-05 DIAGNOSIS — M542 Cervicalgia: Secondary | ICD-10-CM | POA: Insufficient documentation

## 2011-08-15 ENCOUNTER — Telehealth: Payer: Self-pay | Admitting: Family Medicine

## 2011-08-15 NOTE — Telephone Encounter (Signed)
Call pt to know about her welfare. She still has some mild burning sensation on the right side of throat, but not other complaints. Gave results of CT scan. No further intervention needed unless new or worsening symptoms appear.

## 2011-08-28 ENCOUNTER — Encounter: Payer: Self-pay | Admitting: Family Medicine

## 2011-08-28 ENCOUNTER — Ambulatory Visit (INDEPENDENT_AMBULATORY_CARE_PROVIDER_SITE_OTHER): Payer: Medicaid Other | Admitting: Family Medicine

## 2011-08-28 ENCOUNTER — Other Ambulatory Visit (HOSPITAL_COMMUNITY)
Admission: RE | Admit: 2011-08-28 | Discharge: 2011-08-28 | Disposition: A | Discharge status: Home / Self Care | Payer: Medicaid Other | Source: Ambulatory Visit | Attending: Family Medicine | Admitting: Family Medicine

## 2011-08-28 VITALS — BP 110/82 | HR 80 | Ht 65.25 in | Wt 207.0 lb

## 2011-08-28 DIAGNOSIS — Z01419 Encounter for gynecological examination (general) (routine) without abnormal findings: Secondary | ICD-10-CM | POA: Insufficient documentation

## 2011-08-28 DIAGNOSIS — Z124 Encounter for screening for malignant neoplasm of cervix: Secondary | ICD-10-CM

## 2011-08-28 DIAGNOSIS — N760 Acute vaginitis: Secondary | ICD-10-CM

## 2011-08-28 DIAGNOSIS — B9689 Other specified bacterial agents as the cause of diseases classified elsewhere: Secondary | ICD-10-CM

## 2011-08-28 DIAGNOSIS — E049 Nontoxic goiter, unspecified: Secondary | ICD-10-CM

## 2011-08-28 DIAGNOSIS — A499 Bacterial infection, unspecified: Secondary | ICD-10-CM

## 2011-08-28 MED ORDER — METRONIDAZOLE 500 MG PO TABS: 500.0000 mg | ORAL_TABLET | Freq: Two times a day (BID) | ORAL | Status: AC

## 2011-08-28 NOTE — Patient Instructions (Addendum)
Your examination showed that you have bacterial vaginosis. The treatment of this condition is with an antibiotic called metronidazole. You can take 1 tablet of 500 mg twice a day for 7 days. Please do not take alcoholic beverages during the time of treatment and for 24 hours after the last dose. I will call you with the results of the rest of tests done today if the abnormal, otherwise you will receive a letter. Next appointment will be in one year or sooner if needed.

## 2011-08-29 NOTE — Progress Notes (Deleted)
  Subjective:    Patient ID: Katelyn Brown, female    DOB: 1970-04-23, 41 y.o.   MRN: [id number]  HPI    Review of Systems      Physical Exam   Subjective:     Katelyn Brown is a 41 y.o. female and is here for a comprehensive physical exam. The patient reports no problems.  The following portions of the patient's history were reviewed and updated as appropriate: allergies, current medications, past family history, past medical history and problem list.  Review of Systems A comprehensive review of systems was negative.   Objective:    BP 110/82  Pulse 80  Ht 5' 5.25" (1.657 m)  Wt 207 lb (93.895 kg)  BMI 34.18 kg/m2  LMP 07/05/2011   Physical Exam  Constitutional: She is oriented to person, place, and time. No distress.  HENT:  Mouth/Throat: No oropharyngeal exudate.  Eyes: Conjunctivae are normal.  Neck: Neck supple.  Cardiovascular: Normal rate and regular rhythm.   Pulmonary/Chest: Effort normal and breath sounds normal.  Abdominal: Soft. Bowel sounds are normal.  Genitourinary: Mild to moderated grayish vaginal discharge. Normal cervix. No cervical motion tenderness. Normal adnexa. No masses palpated. Musculoskeletal: Normal range of motion. She exhibits no edema.  Neurological: She is alert and oriented to person, place, and time. She has normal reflexes.   Assessment:     female exam positive for BV. GC and Chlamydia pending     Plan:    Metronidazole 500 mg BID for 7 days.

## 2011-08-30 ENCOUNTER — Encounter: Payer: Self-pay | Admitting: Family Medicine

## 2011-08-31 DIAGNOSIS — E049 Nontoxic goiter, unspecified: Secondary | ICD-10-CM | POA: Insufficient documentation

## 2011-08-31 NOTE — Assessment & Plan Note (Signed)
Diffuse enlargement of thyroid gland with normal TSH. No nodules palpated.  Plan: Exam of thyroid and TSH f/u every 6 months.

## 2011-08-31 NOTE — Assessment & Plan Note (Signed)
Per wet mount performed in clinic on a symptomatic pt. Plan Metronidazole 500mg  BID for 7 days.

## 2011-08-31 NOTE — Progress Notes (Signed)
  Subjective:    Patient ID: Katelyn Brown, female    DOB: 04-04-1970, 41 y.o.   MRN: [id number]  HPI Pt is here today for her comprehensive physical exam. She has not current complaints.  Review of Systems  Constitutional: Negative.   HENT: Negative.   Respiratory: Negative.   Cardiovascular: Negative.   Genitourinary: Positive for vaginal discharge.       Objective:   Physical Exam BP 110/82  Pulse 80  Ht 5' 5.25" (1.657 m)  Wt 207 lb (93.895 kg)  BMI 34.18 kg/m2  LMP 07/05/2011   Physical Exam  Constitutional: She is oriented to person, place, and time. No distress.  HENT:  Mouth/Throat: No oropharyngeal exudate.  Eyes: Conjunctivae are normal.  Neck: Neck supple. thyromegaly. Cardiovascular: Normal rate and regular rhythm.   Pulmonary/Chest: Effort normal and breath sounds normal.  Abdominal: Soft. Bowel sounds are normal.  Genitourinary: Mild to moderated grayish vaginal discharge. Normal cervix. No cervical motion tenderness. Normal adnexa. No masses palpated. Musculoskeletal: Normal range of motion. She exhibits no edema.  Neurological: She is alert and oriented to person, place, and time. She has normal reflexes.      Assessment & Plan:

## 2011-09-30 ENCOUNTER — Telehealth: Payer: Self-pay | Admitting: Family Medicine

## 2011-09-30 NOTE — Telephone Encounter (Signed)
Patient is calling hoping for meds for a yeast infection to be sent to her pharmacy.

## 2011-12-11 ENCOUNTER — Ambulatory Visit (INDEPENDENT_AMBULATORY_CARE_PROVIDER_SITE_OTHER): Payer: Medicaid Other | Admitting: Family Medicine

## 2011-12-11 ENCOUNTER — Encounter: Payer: Self-pay | Admitting: Family Medicine

## 2011-12-11 VITALS — BP 116/83 | HR 82 | Temp 98.9°F | Ht 65.25 in | Wt 211.0 lb

## 2011-12-11 DIAGNOSIS — K589 Irritable bowel syndrome without diarrhea: Secondary | ICD-10-CM

## 2011-12-11 MED ORDER — PROMETHAZINE HCL 12.5 MG PO TABS: 12.5000 mg | ORAL_TABLET | Freq: Three times a day (TID) | ORAL | Status: DC | PRN

## 2011-12-11 MED ORDER — DICYCLOMINE HCL 10 MG PO CAPS: 20.0000 mg | ORAL_CAPSULE | Freq: Three times a day (TID) | ORAL | Status: DC

## 2011-12-11 NOTE — Patient Instructions (Addendum)
I believe that this is your IBS flaring up. Please take Bentyl for the spasm up to 4 times a day as needed. Take the phenergan for any nausea and imodium for diarrhea. Please stay hydrated.   Make a return visit for the discharge.   Take Care,   Dr. Clinton Sawyer  PS - The meds were sent to [facility].

## 2011-12-11 NOTE — Assessment & Plan Note (Signed)
It is unclear to my why the patient stopped taking medication for this. She has no evidence of an ongoing GI infection today. These symptoms are the same pattern as her IBS. She was instructed to stay hydrated, take phenergan PRN for nausea, loperamide PRN for diarrhea, and bentyl PRN for cramping. F/u with PCP for ongoing mgt.

## 2011-12-11 NOTE — Progress Notes (Signed)
  Subjective:    Patient ID: Katelyn Brown, female    DOB: July 29, 1970, 41 y.o.   MRN: [id number]  HPI  HPI: 1 day of cramping abdominal pain and diarrhea  X 4; did not look to see if it has blood or had an abnormal color, it improved with zantac and hydrocodone that she has left over from surgery from a surgery in February, no vomiting, yes subjective fever, no new or undercooked foods, no recent travel or exposure to contaminated water; 24 hour food log - Ritz crackers, water and cranberry juice - 2-3 glasses; Daughter who she lives her has been fever and sore throat and a friend with the flu; patient has not received flu shot and does not want it  PMH: IBS - untreated mixed type, diagnosed in 1990's, take fiber for it  Social - lives with daughter  Review of Systems Positive for vag discharge Negative unless stated in HPI    Objective:   Physical Exam BP 116/83  Pulse 82  Temp 98.9 F (37.2 C) (Oral)  Ht 5' 5.25" (1.657 m)  Wt 211 lb (95.709 kg)  BMI 34.84 kg/m2  LMP 10/29/2011 Gen: mildly ill appearing Middle age AAF, pleasant and conversant, A&Ox4 Mouth: MMM, OP clear and moist CV: RRR, no murmur, 2+ peripheral pulses, < 2 sec cap refill Abd: ND, hypoactive bowel sounds, diffusely tender, no masses       Assessment & Plan:  41 year old F with resolved diarrhea and cramping likely secondary to exacerbation of IBS.

## 2012-01-24 ENCOUNTER — Encounter: Payer: Self-pay | Admitting: Family Medicine

## 2012-01-24 ENCOUNTER — Ambulatory Visit (INDEPENDENT_AMBULATORY_CARE_PROVIDER_SITE_OTHER): Payer: Medicaid Other | Admitting: Family Medicine

## 2012-01-24 VITALS — BP 120/84 | HR 75 | Temp 99.0°F | Ht 65.0 in | Wt 208.6 lb

## 2012-01-24 DIAGNOSIS — L659 Nonscarring hair loss, unspecified: Secondary | ICD-10-CM

## 2012-01-24 NOTE — Patient Instructions (Addendum)
It has been a pleasure to see you today. The referral for dermatology has been placed.

## 2012-01-26 DIAGNOSIS — L659 Nonscarring hair loss, unspecified: Secondary | ICD-10-CM | POA: Insufficient documentation

## 2012-01-26 NOTE — Assessment & Plan Note (Signed)
On female distribution. Skin/folicle atrophy of scalp. No signs of acute condition. F/u with dermatology for years. Uses shampoo and topical treatment.

## 2012-01-26 NOTE — Progress Notes (Signed)
Family Medicine Office Visit Note   Subjective:   Patient ID: Katelyn Brown, female  DOB: 1970-11-12, 41 y.o.. MRN: [id number]   Pt that comes today asking for a referral for dermatology. She has been f/u by this specialty for a number of years due to alopecia. Has tried multiple therapies and last one was about a year ago. She wants refills of medications she uses but was told that in order to f/u properly she needs a referral from [place]. She states her conditions is the same,but  at least the topical medications she uses prevents worsening.  Review of Systems:  per HPI  Objective:   Physical Exam: Gen:NAD Skin: scattered hair present on frontal, parietal and superior portion of occipital area. No erythema or visible lesions seen on scalp. The hair present is thin and short.  Assessment & Plan:

## 2012-03-05 ENCOUNTER — Other Ambulatory Visit (HOSPITAL_COMMUNITY)
Admission: RE | Admit: 2012-03-05 | Discharge: 2012-03-05 | Disposition: A | Discharge status: Home / Self Care | Payer: Medicaid Other | Source: Ambulatory Visit | Attending: Family Medicine | Admitting: Family Medicine

## 2012-03-05 ENCOUNTER — Ambulatory Visit (INDEPENDENT_AMBULATORY_CARE_PROVIDER_SITE_OTHER): Payer: Medicaid Other | Admitting: Family Medicine

## 2012-03-05 ENCOUNTER — Encounter: Payer: Self-pay | Admitting: Family Medicine

## 2012-03-05 VITALS — BP 121/76 | HR 76 | Temp 98.6°F | Ht 65.0 in | Wt 206.0 lb

## 2012-03-05 DIAGNOSIS — Z202 Contact with and (suspected) exposure to infections with a predominantly sexual mode of transmission: Secondary | ICD-10-CM

## 2012-03-05 DIAGNOSIS — Z113 Encounter for screening for infections with a predominantly sexual mode of transmission: Secondary | ICD-10-CM | POA: Insufficient documentation

## 2012-03-05 DIAGNOSIS — N898 Other specified noninflammatory disorders of vagina: Secondary | ICD-10-CM

## 2012-03-05 DIAGNOSIS — Z2089 Contact with and (suspected) exposure to other communicable diseases: Secondary | ICD-10-CM

## 2012-03-05 DIAGNOSIS — Z20828 Contact with and (suspected) exposure to other viral communicable diseases: Secondary | ICD-10-CM

## 2012-03-05 LAB — RPR

## 2012-03-05 LAB — POCT WET PREP (WET MOUNT)

## 2012-03-05 MED ORDER — METRONIDAZOLE 500 MG PO TABS: 500.0000 mg | ORAL_TABLET | Freq: Two times a day (BID) | ORAL | Status: DC

## 2012-03-05 NOTE — Patient Instructions (Signed)
We checked you for gonorrhea, chlamydia, HIV, Syphilis, trichomonas, and bacterial infection in the vagina today. We will let you know the results when available (some can take up to a week). Since your partner has a history of chlamydia, I would avoid sexual contact for 7 days after both of you have been tested and treated if anything is found.  I would also consider protecting yourself with condoms from now on due to the history.   Let us know if you need to talk with someone about your feelings of being down after the bad news you got in December,  Dr. Durene Cal.   Health Maintenance Due  Topic Date Due  . Influenza Vaccine  10/20/2011

## 2012-03-05 NOTE — Progress Notes (Signed)
Subjective:   1. Vaginal odor and discharge-patient has noted a change in the odor of her vaginal discharge as well as increased in amount over last 1-2 weeks. Patients sole sexual partner of 3 years recently told her that he has been active with other partners that she was unaware of. History of chlamydia in this partner. He has not been tested after new sexual contacts. Does have some abdominal pain which is a common issue for her with untreated IBS. Thinks having slightly more cramping in this area though.   ROS--Denies nausea/vomiting/fever/chills/fatigue/overall sick feelings.  Patient is slightly sad as well due to discovery of partner's sexual contacts outside of relationship. Denies polyuria, dysuria, polyuria. Normal BMs for her (some constipation some diarrhea due to IBS but no change)  Past Medical History-IBS, depression, obesity, personal history of chlamydia.    Reviewed problem list.  Medications- reviewed and updated Chief complaint-noted  Objective: BP 121/76  Pulse 76  Temp 98.6 F (37 C) (Oral)  Ht 5\' 5"  (1.651 m)  Wt 206 lb (93.441 kg)  BMI 34.28 kg/m2  LMP 02/18/2012 Gen: NAD Lungs: clear to auscultation bilaterally Heart: regular rate and rhythm, S1, S2 normal, no murmur, click, rub or gallop Abdomen: diffusely tender (normal per patient), some moderate tenderness in RLQ and LLQ Pelvic: cervix normal in appearance, external genitalia normal, no adnexal masses or tenderness, no cervical motion tenderness, uterus normal size, shape, and consistency and small amounts of discharge noted in posterior vault.    Assessment/Plan: See problem oriented charted

## 2012-03-05 NOTE — Assessment & Plan Note (Signed)
Wet prep positive for BV. Flagyl sent and patient informed. GC/Chlamydia/RPR/HIV pending. No signs HSV or genital warts on exam for concern of STD with partner having sex outside of relationship.

## 2012-03-11 ENCOUNTER — Encounter: Payer: Self-pay | Admitting: Family Medicine

## 2012-03-12 ENCOUNTER — Ambulatory Visit (INDEPENDENT_AMBULATORY_CARE_PROVIDER_SITE_OTHER): Payer: Medicaid Other | Admitting: Family Medicine

## 2012-03-12 ENCOUNTER — Encounter: Payer: Self-pay | Admitting: Family Medicine

## 2012-03-12 VITALS — BP 119/78 | HR 101 | Ht 65.0 in | Wt 207.0 lb

## 2012-03-12 DIAGNOSIS — L538 Other specified erythematous conditions: Secondary | ICD-10-CM

## 2012-03-12 DIAGNOSIS — L304 Erythema intertrigo: Secondary | ICD-10-CM

## 2012-03-12 DIAGNOSIS — E669 Obesity, unspecified: Secondary | ICD-10-CM

## 2012-03-12 DIAGNOSIS — Z0000 Encounter for general adult medical examination without abnormal findings: Secondary | ICD-10-CM

## 2012-03-12 MED ORDER — MICONAZOLE NITRATE 2 % EX CREA: TOPICAL_CREAM | Freq: Two times a day (BID) | CUTANEOUS | Status: DC

## 2012-03-12 NOTE — Patient Instructions (Addendum)
Intertrigo Intertrigo is a skin condition that occurs in between folds of skin in places on the body that rub together a lot and do not get much ventilation. It is caused by heat, moisture, friction, sweat retention, and lack of air circulation, which produces red, irritated patches and, sometimes, scaling or drainage. People who have diabetes, who are obese, or who have treatment with antibiotics are at increased risk for intertrigo. The most common sites for intertrigo to occur include:  The groin.  The breasts.  The armpits.  Folds of abdominal skin.  Webbed spaces between the fingers or toes. Intertrigo may be aggravated by:  Sweat.  Feces.  Yeast or bacteria that are present near skin folds.  Urine.  Vaginal discharge. HOME CARE INSTRUCTIONS  The following steps can be taken to reduce friction and keep the affected area cool and dry:  Expose skin folds to the air.  Keep deep skin folds separated with cotton or linen cloth. Avoid tight fitting clothing that could cause chafing.  Wear open-toed shoes or sandals to help reduce moisture between the toes.  Apply absorbent powders to affected areas as directed by your caregiver.  Apply over-the-counter barrier pastes, such as zinc oxide, as directed by your caregiver.  If you develop a fungal infection in the affected area, your caregiver may have you use antifungal creams. SEEK MEDICAL CARE IF:   The rash is not improving after 1 week of treatment.  The rash is getting worse (more red, more swollen, more painful, or spreading).  You have a fever or chills. MAKE SURE YOU:   Understand these instructions.  Will watch your condition.  Will get help right away if you are not doing well or get worse. Document Released: 02/04/2005 Document Revised: 04/29/2011 Document Reviewed: 07/20/2009 [employer] Patient Information 2013 [employer].  

## 2012-03-13 ENCOUNTER — Encounter: Payer: Self-pay | Admitting: Family Medicine

## 2012-03-13 DIAGNOSIS — L304 Erythema intertrigo: Secondary | ICD-10-CM | POA: Insufficient documentation

## 2012-03-13 NOTE — Assessment & Plan Note (Signed)
No interest on loosing weight now.

## 2012-03-13 NOTE — Assessment & Plan Note (Signed)
Present below breasts bilaterally. Plan: miconazole cream. F/u as needed.

## 2012-03-13 NOTE — Assessment & Plan Note (Addendum)
Normal BP. Non smoker.  Pap smear normal last year not needed until 2016. Mammogram as screening not indicated but pt would like to continue yearly.  No colonoscopy indicated per age and no FHx of colon cancer. Discussion about weight management.Pt not interested at this time Next appointment in 1 year.

## 2012-03-13 NOTE — Progress Notes (Signed)
Family Medicine Office Visit Note   Subjective:   Patient ID: Katelyn Brown, female  DOB: 05/09/1970, 42 y.o.. MRN: [id number]   Pt that comes today for her annual exam. She has not current complaints. She reports family history of cancer (lung and prostate) on elderly maternal aunt and uncle and she is concerned and would like to get screened for cancer.  Last pap smear was 08/28/11 normal. None abnormal documented since 2004 Mammography 06/24/11 normal. Pt has had mammogram in 2006; 2012 and 2013 due to her family hx reported.  She has  RPR, HIV and GC /Chla done recently and results are negative. BV has been treated with metronidazole pt on her last pills.   Review of Systems:  Pt denies SOB, chest pain, palpitations, headaches, dizziness, numbness or weakness. No changes on urinary or BM habits. No unintentional weigh loss/gain.  Objective:   Physical Exam: Gen:  NAD HEENT: Moist mucous membranes  CV: Regular rate and rhythm, no murmurs rubs or gallops PULM: Clear to auscultation bilaterally. No wheezes/rales/rhonchi ABD: Soft, non tender, non distended, normal bowel sounds EXT: No edema Neuro: Alert and oriented x3. No focalization Skin: alopecia. Macular erythematous plaques under breasts bilaterally. Pt declines vaginal exam since she ha one done on the 16th of this month. Assessment & Plan:

## 2012-03-18 ENCOUNTER — Telehealth: Payer: Self-pay | Admitting: Family Medicine

## 2012-03-18 NOTE — Telephone Encounter (Signed)
Patient is calling requesting a referral to be checked for Fibroid Tumors.

## 2012-03-24 ENCOUNTER — Ambulatory Visit (INDEPENDENT_AMBULATORY_CARE_PROVIDER_SITE_OTHER): Payer: Medicaid Other | Admitting: Family Medicine

## 2012-03-24 ENCOUNTER — Encounter: Payer: Self-pay | Admitting: Family Medicine

## 2012-03-24 VITALS — BP 120/86 | HR 83 | Temp 98.7°F | Ht 65.0 in | Wt 206.0 lb

## 2012-03-24 DIAGNOSIS — N920 Excessive and frequent menstruation with regular cycle: Secondary | ICD-10-CM

## 2012-03-24 DIAGNOSIS — N938 Other specified abnormal uterine and vaginal bleeding: Secondary | ICD-10-CM | POA: Insufficient documentation

## 2012-03-24 NOTE — Assessment & Plan Note (Signed)
Unclear etiology at this time.  Obtaining US to assess endometrial stripe and possible fibroids.  Patient instructed to follow up with PCP after US obtained.

## 2012-03-24 NOTE — Patient Instructions (Addendum)
I am ordering an ultrasound.  The clinic will call you to set up time/appointment.  I you develop breakthrough bleeding or heavy bleeding in the near future please call the clinic or come in for a visit.    Please follow up with your PCP after [place] has been done.

## 2012-03-24 NOTE — Progress Notes (Signed)
Subjective:     Patient ID: Katelyn Brown, female   DOB: Apr 01, 1970, 42 y.o.   MRN: [id number]  HPI 42 year old female presents today with complaints of recent heavy vaginal bleeding.  1) Menorrhagia - Patient reports that in January her menstrual cycle was considerably more heavy and lasted for approximately 7 days (24th - 31st) - She had to change pad and tampon approximately every hour for 7 days straight.  Some large clots intially.  Associated with worsening cramping. - After the 7th day bleeding subsided.  However given the severity of her bleeding patient wanted to be evaluated for possible fibroids. - She reports that this has happened in the past but many years ago.  Review of Systems See HPI    Objective:   Physical Exam General: well appearing, NAD. Heart: RRR. No murmurs, rubs, or gallops. Lungs: CTAB. Abdomen: diffusely tender to palpation (patient reports that this is her baseline) Bimanual exam: Uterus and adnexa normal to palpation.    Assessment:       Plan:

## 2012-03-27 ENCOUNTER — Ambulatory Visit (HOSPITAL_COMMUNITY)
Admission: RE | Admit: 2012-03-27 | Discharge: 2012-03-27 | Disposition: A | Discharge status: Home / Self Care | Payer: Medicaid Other | Source: Ambulatory Visit | Attending: Family Medicine | Admitting: Family Medicine

## 2012-03-27 DIAGNOSIS — D251 Intramural leiomyoma of uterus: Secondary | ICD-10-CM | POA: Insufficient documentation

## 2012-03-27 DIAGNOSIS — N920 Excessive and frequent menstruation with regular cycle: Secondary | ICD-10-CM

## 2012-03-27 DIAGNOSIS — N949 Unspecified condition associated with female genital organs and menstrual cycle: Secondary | ICD-10-CM | POA: Insufficient documentation

## 2012-03-27 DIAGNOSIS — N8300 Follicular cyst of ovary, unspecified side: Secondary | ICD-10-CM | POA: Insufficient documentation

## 2012-04-06 ENCOUNTER — Telehealth: Payer: Self-pay | Admitting: Family Medicine

## 2012-04-06 NOTE — Telephone Encounter (Signed)
Would like to know results of her [place] from last week

## 2012-04-06 NOTE — Telephone Encounter (Signed)
Please call pt and inform that U/S was only positive for less 1 cm intramural fibroid. Rest of U/S was unremarkable. For more detailed information /discussion pt needs appointment. Thanks

## 2012-04-07 NOTE — Telephone Encounter (Signed)
Without a definitive diagnosis I would not recommend anything stronger than ibuprofen. Pt  needs medical re-evaluation.

## 2012-04-07 NOTE — Telephone Encounter (Signed)
Spoke with patient and informed of results below. She states that there has been no more heavy vaginal bleeding, but is still having pain. She was wanting to know if there is anything else she can take stronger than Ibuprofen and if it can be prescribed

## 2012-04-08 NOTE — Telephone Encounter (Signed)
Patient informed. 

## 2012-04-14 ENCOUNTER — Encounter: Payer: Self-pay | Admitting: Family Medicine

## 2012-04-14 ENCOUNTER — Ambulatory Visit (INDEPENDENT_AMBULATORY_CARE_PROVIDER_SITE_OTHER): Payer: Medicaid Other | Admitting: Family Medicine

## 2012-04-14 VITALS — BP 119/85 | HR 82 | Ht 65.0 in | Wt 204.0 lb

## 2012-04-14 DIAGNOSIS — N920 Excessive and frequent menstruation with regular cycle: Secondary | ICD-10-CM

## 2012-04-14 LAB — CBC
MCH: 29.2 pg (ref 26.0–34.0)
Platelets: 324 10*3/uL (ref 150–400)
RBC: 4.35 MIL/uL (ref 3.87–5.11)
RDW: 14.7 % (ref 11.5–15.5)
WBC: 12.2 10*3/uL — ABNORMAL HIGH (ref 4.0–10.5)

## 2012-04-14 MED ORDER — NORETHINDRONE-ETH ESTRADIOL 1-35 MG-MCG PO TABS: 1.0000 | ORAL_TABLET | Freq: Every day | ORAL | Status: DC

## 2012-04-14 MED ORDER — ONDANSETRON HCL 4 MG PO TABS: 4.0000 mg | ORAL_TABLET | Freq: Three times a day (TID) | ORAL | Status: DC | PRN

## 2012-04-14 NOTE — Assessment & Plan Note (Signed)
Continued menorrhagia with heavy bleeding for almost a month.  No pregnancy seen on [place], so will not order UPT.   Given her duration and amount of bleeding will start high dose OCP until bleeding stops, then taper for withdrawal bleed and then plan to cycle for three months.  Given zofran to use for nausea if needed.  Willl have her f/u with PCP or sooner if not improving.  Check CBC today as well given weakness and mild dizziness.

## 2012-04-14 NOTE — Progress Notes (Signed)
  Subjective:    Patient ID: Katelyn Brown, female    DOB: 12-Sep-1970, 42 y.o.   MRN: [id number]  HPI  1. Menorrhagia:  Patient comes in today with continued menorrhagia.  Has had heavy bleeding for most of the past month.  She was seen in early February and had an US done that showed a small fibroid.  Pelvic exam at that time was normal as well.  She endorses heavy cramping as well.  Her menstrual periods are typically monthly.  She does feel weak and lightheaded at times.  Review of Systems Per HPI    Objective:   Physical Exam  Constitutional: She appears well-nourished. No distress.  HENT:  Head: Normocephalic and atraumatic.  Eyes:  No conjunctival pallor   Neck: Neck supple.  Abdominal: Soft. Bowel sounds are normal. She exhibits no distension. There is no tenderness.          Assessment & Plan:

## 2012-04-14 NOTE — Patient Instructions (Addendum)
Menorrhagia Dysfunctional uterine bleeding is different from a normal menstrual period. When periods are heavy or there is more bleeding than is usual for you, it is called menorrhagia. It may be caused by hormonal imbalance, or physical, metabolic, or other problems. Examination is necessary in order that your caregiver may treat treatable causes. If this is a continuing problem, a D&C may be needed. That means that the cervix (the opening of the uterus or womb) is dilated (stretched larger) and the lining of the uterus is scraped out. The tissue scraped out is then examined under a microscope by a specialist (pathologist) to make sure there is nothing of concern that needs further or more extensive treatment. HOME CARE INSTRUCTIONS   If medications were prescribed, take exactly as directed. Do not change or switch medications without consulting your caregiver.  Long term heavy bleeding may result in iron deficiency. Your caregiver may have prescribed iron pills. They help replace the iron your body lost from heavy bleeding. Take exactly as directed. Iron may cause constipation. If this becomes a problem, increase the bran, fruits, and roughage in your diet.  Do not take aspirin or medicines that contain aspirin one week before or during your menstrual period. Aspirin may make the bleeding worse.  If you need to change your sanitary pad or tampon more than once every 2 hours, stay in bed and rest as much as possible until the bleeding stops.  Eat well-balanced meals. Eat foods high in iron. Examples are leafy green vegetables, meat, liver, eggs, and whole grain breads and cereals. Do not try to lose weight until the abnormal bleeding has stopped and your blood iron level is back to normal. SEEK MEDICAL CARE IF:   You need to change your sanitary pad or tampon more than once an hour.  You develop nausea (feeling sick to your stomach) and vomiting, dizziness, or diarrhea while you are taking your  medicine.  You have any problems that may be related to the medicine you are taking. SEEK IMMEDIATE MEDICAL CARE IF:   You have a fever.  You develop chills.  You develop severe bleeding or start to pass blood clots.  You feel dizzy or faint. MAKE SURE YOU:   Understand these instructions.  Will watch your condition.  Will get help right away if you are not doing well or get worse. Document Released: 02/04/2005 Document Revised: 04/29/2011 Document Reviewed: 09/25/2007 [facility] Patient Information 2013 [place], [place].  Instructions for using the pill Take 1 tablet 4 times daily for 3, then Take 1 tablet 3 times daily for 5 days, then Take 1 tablet 2 times daily for 2 days, then Take 1 tablet daily for 3 weeks, then Skip one week of pills to allow for withdrawal bleeding, and then Cycle on Oral Contraceptives for 3 months. I have given you a medication for nausea as well.

## 2012-04-28 ENCOUNTER — Encounter: Payer: Self-pay | Admitting: Family Medicine

## 2012-04-28 ENCOUNTER — Ambulatory Visit (INDEPENDENT_AMBULATORY_CARE_PROVIDER_SITE_OTHER): Payer: Medicaid Other | Admitting: Family Medicine

## 2012-04-28 VITALS — BP 119/84 | HR 88 | Temp 99.6°F | Ht 65.0 in | Wt 209.0 lb

## 2012-04-28 DIAGNOSIS — F339 Major depressive disorder, recurrent, unspecified: Secondary | ICD-10-CM

## 2012-04-28 DIAGNOSIS — N938 Other specified abnormal uterine and vaginal bleeding: Secondary | ICD-10-CM

## 2012-04-28 DIAGNOSIS — N949 Unspecified condition associated with female genital organs and menstrual cycle: Secondary | ICD-10-CM

## 2012-04-28 DIAGNOSIS — N925 Other specified irregular menstruation: Secondary | ICD-10-CM

## 2012-04-28 MED ORDER — BUPROPION HCL 75 MG PO TABS: 75.0000 mg | ORAL_TABLET | Freq: Two times a day (BID) | ORAL | Status: DC

## 2012-04-28 MED ORDER — NORETHINDRONE-ETH ESTRADIOL 1-35 MG-MCG PO TABS: 1.0000 | ORAL_TABLET | Freq: Every day | ORAL | Status: DC

## 2012-04-28 NOTE — Patient Instructions (Addendum)
Dysfunctional Uterine Bleeding Normally, menstrual periods begin between ages 11 to 17 in young women. A normal menstrual cycle/period may begin every 23 days up to 35 days and lasts from 1 to 7 days. Around 12 to 14 days before your menstrual period starts, ovulation (ovary produces an egg) occurs. When counting the time between menstrual periods, count from the first day of bleeding of the previous period to the first day of bleeding of the next period. Dysfunctional (abnormal) uterine bleeding is bleeding that is different from a normal menstrual period. Your periods may come earlier or later than usual. They may be lighter, have blood clots or be heavier. You may have bleeding between periods, or you may skip one period or more. You may have bleeding after sexual intercourse, bleeding after menopause, or no menstrual period. CAUSES   Pregnancy (normal, miscarriage, tubal).  IUDs (intrauterine device, birth control).  Birth control pills.  Hormone treatment.  Menopause.  Infection of the cervix.  Blood clotting problems.  Infection of the inside lining of the uterus.  Endometriosis, inside lining of the uterus growing in the pelvis and other female organs.  Adhesions (scar tissue) inside the uterus.  Obesity or severe weight loss.  Uterine polyps inside the uterus.  Cancer of the vagina, cervix, or uterus.  Ovarian cysts or polycystic ovary syndrome.  Medical problems (diabetes, thyroid disease).  Uterine fibroids (noncancerous tumor).  Problems with your female hormones.  Endometrial hyperplasia, very thick lining and enlarged cells inside of the uterus.  Medicines that interfere with ovulation.  Radiation to the pelvis or abdomen.  Chemotherapy. DIAGNOSIS   Your doctor will discuss the history of your menstrual periods, medicines you are taking, changes in your weight, stress in your life, and any medical problems you may have.  Your doctor will do a physical  and pelvic examination.  Your doctor may want to perform certain tests to make a diagnosis, such as:  Pap test.  Blood tests.  Cultures for infection.  CT scan.  Ultrasound.  Hysteroscopy.  Laparoscopy.  MRI.  Hysterosalpingography.  D and C.  Endometrial biopsy. TREATMENT  Treatment will depend on the cause of the dysfunctional uterine bleeding (DUB). Treatment may include:  Observing your menstrual periods for a couple of months.  Prescribing medicines for medical problems, including:  Antibiotics.  Hormones.  Birth control pills.  Removing an IUD (intrauterine device, birth control).  Surgery:  D and C (scrape and remove tissue from inside the uterus).  Laparoscopy (examine inside the abdomen with a lighted tube).  Uterine ablation (destroy lining of the uterus with electrical current, laser, heat, or freezing).  Hysteroscopy (examine cervix and uterus with a lighted tube).  Hysterectomy (remove the uterus). HOME CARE INSTRUCTIONS   If medicines were prescribed, take exactly as directed. Do not change or switch medicines without consulting your caregiver.  Long term heavy bleeding may result in iron deficiency. Your caregiver may have prescribed iron pills. They help replace the iron that your body lost from heavy bleeding. Take exactly as directed.  Do not take aspirin or medicines that contain aspirin one week before or during your menstrual period. Aspirin may make the bleeding worse.  If you need to change your sanitary pad or tampon more than once every 2 hours, stay in bed with your feet elevated and a cold pack on your lower abdomen. Rest as much as possible, until the bleeding stops or slows down.  Eat well-balanced meals. Eat foods high in iron. Examples   are:  Leafy green vegetables.  Whole-grain breads and cereals.  Eggs.  Meat.  Liver.  Do not try to lose weight until the abnormal bleeding has stopped and your blood iron level is  back to normal. Do not lift more than ten pounds or do strenuous activities when you are bleeding.  For a couple of months, make note on your calendar, marking the start and ending of your period, and the type of bleeding (light, medium, heavy, spotting, clots or missed periods). This is for your caregiver to better evaluate your problem. SEEK MEDICAL CARE IF:   You develop nausea (feeling sick to your stomach) and vomiting, dizziness, or diarrhea while you are taking your medicine.  You are getting lightheaded or weak.  You have any problems that may be related to the medicine you are taking.  You want to stop or change your birth control pills or hormones.  You have any type of abnormal bleeding mentioned above.  You are over 8 years old and have not had a menstrual period yet.  You are 42 years old and you are still having menstrual periods.  You have any of the symptoms mentioned above.  You develop a rash. SEEK IMMEDIATE MEDICAL CARE IF:   An oral temperature above 102 F (38.9 C) develops.  You develop chills.  You are changing your sanitary pad or tampon more than once an hour.  You develop abdominal pain.  You pass out or faint. Document Released: 02/02/2000 Document Revised: 04/29/2011 Document Reviewed: 01/03/2009 [facility] Patient Information 2013 [place], [place].  Depression, Adult Depression refers to feeling sad, low, down in the dumps, blue, gloomy, or empty. In general, there are two kinds of depression: 1. Depression that we all experience from time to time because of upsetting life experiences, including the loss of a job or the ending of a relationship (normal sadness or normal grief). This kind of depression is considered normal, is short lived, and resolves within a few days to 2 weeks. (Depression experienced after the loss of a loved one is called bereavement. Bereavement often lasts longer than 2 weeks but normally gets better with time.) 2. Clinical  depression, which lasts longer than normal sadness or normal grief or interferes with your ability to function at home, at work, and in school. It also interferes with your personal relationships. It affects almost every aspect of your life. Clinical depression is an illness. Symptoms of depression also can be caused by conditions other than normal sadness and grief or clinical depression. Examples of these conditions are listed as follows:  Physical illness Some physical illnesses, including underactive thyroid gland (hypothyroidism), severe anemia, specific types of cancer, diabetes, uncontrolled seizures, heart and lung problems, strokes, and chronic pain are commonly associated with symptoms of depression.  Side effects of some prescription medicine In some people, certain types of prescription medicine can cause symptoms of depression.  Substance abuse Abuse of alcohol and illicit drugs can cause symptoms of depression. SYMPTOMS Symptoms of normal sadness and normal grief include the following:  Feeling sad or crying for short periods of time.  Not caring about anything (apathy).  Difficulty sleeping or sleeping too much.  No longer able to enjoy the things you used to enjoy.  Desire to be by oneself all the time (social isolation).  Lack of energy or motivation.  Difficulty concentrating or remembering.  Change in appetite or weight.  Restlessness or agitation. Symptoms of clinical depression include the same symptoms of normal sadness  or normal grief and also the following symptoms:  Feeling sad or crying all the time.  Feelings of guilt or worthlessness.  Feelings of hopelessness or helplessness.  Thoughts of suicide or the desire to harm yourself (suicidal ideation).  Loss of touch with reality (psychotic symptoms). Seeing or hearing things that are not real (hallucinations) or having false beliefs about your life or the people around you (delusions and  paranoia). DIAGNOSIS  The diagnosis of clinical depression usually is based on the severity and duration of the symptoms. Your caregiver also will ask you questions about your medical history and substance use to find out if physical illness, use of prescription medicine, or substance abuse is causing your depression. Your caregiver also may order blood tests. TREATMENT  Typically, normal sadness and normal grief do not require treatment. However, sometimes antidepressant medicine is prescribed for bereavement to ease the depressive symptoms until they resolve. The treatment for clinical depression depends on the severity of your symptoms but typically includes antidepressant medicine, counseling with a mental health professional, or a combination of both. Your caregiver will help to determine what treatment is best for you. Depression caused by physical illness usually goes away with appropriate medical treatment of the illness. If prescription medicine is causing depression, talk with your caregiver about stopping the medicine, decreasing the dose, or substituting another medicine. Depression caused by abuse of alcohol or illicit drugs abuse goes away with abstinence from these substances. Some adults need professional help in order to stop drinking or using drugs. SEEK IMMEDIATE CARE IF:  You have thoughts about hurting yourself or others.  You lose touch with reality (have psychotic symptoms).  You are taking medicine for depression and have a serious side effect. FOR MORE INFORMATION National [employer] on Mental Illness: www.nami.[employer]: http://www.maynard.net/ Document Released: 02/02/2000 Document Revised: 08/06/2011 Document Reviewed: 05/06/2011 [facility] Patient Information 2013 [place], [place].  Please take medication as prescribed. Make af/u appointment in 3-4 weeks.

## 2012-04-29 NOTE — Progress Notes (Signed)
Family Medicine Office Visit Note   Subjective:   Patient ID: Katelyn Brown, female  DOB: [birth year], 42 y.o.. MRN: [id number]   Pt that comes today for follow up her recent menstrual irregularities. Initially was metrorrhagia but now is more clear that her menses are more frequent and last longer (she describes 3 episodes of bleeding with 2 weeks interval). Pt never took medication recommended on her last visit for fear to estrogens and the relationship with breast cancer. At this time she does not have bleeding. Denies other symptoms.   Depression: she also reports lack of interest and low level of energy. She is trying to exercise to lose weight but she struggles through it due to her energy level. Pt denies suicidal ideation or plan. She has been on   Review of Systems:  Pt denies SOB, chest pain, palpitations, headaches, dizziness, numbness or weakness. No nausea, vomiting fevers or chills. No changes on urinary or BM habits.   Objective:   Physical Exam: Gen:  NAD. Tearful/emotional. HEENT: Moist mucous membranes  CV: Regular rate and rhythm, no murmurs rubs or gallops PULM: Clear to auscultation bilaterally. No wheezes/rales/rhonchi ABD: Soft, non tender, non distended, normal bowel sounds EXT: No edema Neuro: Alert and oriented x3. No focalization Psych: Depressed mood, normal affect. Normal speech. Normal though process. No hallucinations/ delusions. No agitation.  Assessment & Plan:

## 2012-04-29 NOTE — Assessment & Plan Note (Addendum)
This points more to the diagnosis of dysfunctional uterine bleeding. Pt has normal Hb. Plan: Discussed in depth with pt and explained about the need at this time of OCP to control her symptoms. Pt is non smoker. Also other risks of this medication were explained to pt. She agrees to start on OCP for 2-3 months and evaluate response.

## 2012-04-29 NOTE — Assessment & Plan Note (Signed)
Pt with symptoms of depression. Denies suicidal ideation or plan. Plan: Start Wellbutrin at low dose and increase to 75mg  BID F/u in 3-4 weeks or sooner if needed.

## 2012-05-28 ENCOUNTER — Ambulatory Visit: Payer: Medicaid Other | Admitting: Family Medicine

## 2012-06-15 ENCOUNTER — Ambulatory Visit: Payer: Medicaid Other | Admitting: Family Medicine

## 2012-11-08 ENCOUNTER — Inpatient Hospital Stay (HOSPITAL_COMMUNITY): Payer: Medicaid Other

## 2012-11-08 ENCOUNTER — Inpatient Hospital Stay (HOSPITAL_COMMUNITY)
Admission: AD | Admit: 2012-11-08 | Discharge: 2012-11-08 | Disposition: A | Discharge status: Home / Self Care | Payer: Medicaid Other | Source: Ambulatory Visit | Attending: Obstetrics & Gynecology | Admitting: Obstetrics & Gynecology

## 2012-11-08 ENCOUNTER — Encounter (HOSPITAL_COMMUNITY): Payer: Self-pay | Admitting: *Deleted

## 2012-11-08 DIAGNOSIS — D251 Intramural leiomyoma of uterus: Secondary | ICD-10-CM | POA: Insufficient documentation

## 2012-11-08 DIAGNOSIS — R109 Unspecified abdominal pain: Secondary | ICD-10-CM | POA: Insufficient documentation

## 2012-11-08 DIAGNOSIS — K589 Irritable bowel syndrome without diarrhea: Secondary | ICD-10-CM | POA: Insufficient documentation

## 2012-11-08 LAB — COMPREHENSIVE METABOLIC PANEL
ALT: 16 U/L (ref 0–35)
AST: 14 U/L (ref 0–37)
Albumin: 3.5 g/dL (ref 3.5–5.2)
Alkaline Phosphatase: 51 U/L (ref 39–117)
Calcium: 8.9 mg/dL (ref 8.4–10.5)
Potassium: 3.8 mEq/L (ref 3.5–5.1)
Sodium: 138 mEq/L (ref 135–145)
Total Protein: 6.9 g/dL (ref 6.0–8.3)

## 2012-11-08 LAB — CBC WITH DIFFERENTIAL/PLATELET
Basophils Absolute: 0.1 10*3/uL (ref 0.0–0.1)
Basophils Relative: 0 % (ref 0–1)
Eosinophils Absolute: 0.3 10*3/uL (ref 0.0–0.7)
Lymphs Abs: 4.2 10*3/uL — ABNORMAL HIGH (ref 0.7–4.0)
MCH: 29.3 pg (ref 26.0–34.0)
MCHC: 33.7 g/dL (ref 30.0–36.0)
Neutrophils Relative %: 62 % (ref 43–77)
Platelets: 250 10*3/uL (ref 150–400)
RBC: 4.3 MIL/uL (ref 3.87–5.11)
RDW: 13.7 % (ref 11.5–15.5)

## 2012-11-08 LAB — URINALYSIS, ROUTINE W REFLEX MICROSCOPIC
Ketones, ur: NEGATIVE mg/dL
Leukocytes, UA: NEGATIVE
Nitrite: NEGATIVE
Specific Gravity, Urine: 1.015 (ref 1.005–1.030)
Urobilinogen, UA: 0.2 mg/dL (ref 0.0–1.0)
pH: 7 (ref 5.0–8.0)

## 2012-11-08 LAB — WET PREP, GENITAL
Trich, Wet Prep: NONE SEEN
Yeast Wet Prep HPF POC: NONE SEEN

## 2012-11-08 MED ORDER — HYDROMORPHONE HCL PF 1 MG/ML IJ SOLN
1.0000 mg | Freq: Once | INTRAMUSCULAR | Status: AC
  Administered 2012-11-08: 1 mg via INTRAMUSCULAR
  Filled 2012-11-08: qty 1

## 2012-11-08 MED ORDER — DICYCLOMINE HCL 10 MG PO CAPS
20.0000 mg | ORAL_CAPSULE | Freq: Once | ORAL | Status: AC
  Administered 2012-11-08: 20 mg via ORAL
  Filled 2012-11-08: qty 2

## 2012-11-08 NOTE — Progress Notes (Signed)
Pelvic exam and cultures. No spec used

## 2012-11-08 NOTE — Progress Notes (Signed)
Written and verbal d/c instructions given and understanding voiced. 

## 2012-11-08 NOTE — MAU Provider Note (Signed)
History     CSN: [id number]  Arrival date and time: 11/08/12 0256   None     Chief Complaint  Patient presents with  . Abdominal Pain   HPI This is a 42 y.o. female who presents with c/o Left abdominal pain which just started. Has history of IBS mixed. Denies any diarrhea or constipation now. + nausea, no fever.  Has one known small fibroid (<1cm)  RN Note: was feeling bad earlier and took Nyquil. I woke up about ago having pain in L side of abdomen. Feels like my stomach is swelling.       OB History   Grav Para Term Preterm Abortions TAB SAB Ect Mult Living   5 3 3  2 1 1          Past Medical History  Diagnosis Date  . IBS (irritable bowel syndrome)   . Carpal tunnel syndrome   . Allergy   . Neck pain     C5-C6 mild buldge  . STD (female)     History of GC/Chlamydia during preg  . Pyelonephritis 2000  . Ovarian cyst 2004  . Alopecia     f/u with Derm-- idiopathic scarring alopecia  . Hemorrhoid 2006    Colonscopy    Past Surgical History  Procedure Laterality Date  . Facial fracture surgery  1993  . Tubal ligation      Family History  Problem Relation Age of Onset  . Cancer Maternal Aunt   . Cancer Maternal Uncle     History  Substance Use Topics  . Smoking status: Never Smoker   . Smokeless tobacco: Never Used  . Alcohol Use: 0.0 oz/week     Comment: rare    Allergies: No Known Allergies  Prescriptions prior to admission  Medication Sig Dispense Refill  . buPROPion (WELLBUTRIN) 75 MG tablet Take 1 tablet (75 mg total) by mouth 2 (two) times daily.  30 tablet  3  . cetirizine (ZYRTEC) 10 MG tablet Take 1 tablet (10 mg total) by mouth daily.  30 tablet  11  . dicyclomine (BENTYL) 10 MG capsule Take 2 capsules (20 mg total) by mouth 4 (four) times daily -  before meals and at bedtime.  60 capsule  1  . fluticasone (FLONASE) 50 MCG/ACT nasal spray Place 2 sprays into the nose daily.  16 g  6  . norethindrone-ethinyl estradiol 1/35  (ORTHO-NOVUM 1/35, 28,) tablet Take 1 tablet by mouth daily. Take as directed.  1 Package  6    Review of Systems  Constitutional: Negative for fever, chills and malaise/fatigue.  Gastrointestinal: Positive for nausea and abdominal pain. Negative for vomiting, diarrhea and constipation.  Genitourinary: Negative for dysuria and frequency.  Musculoskeletal: Negative for myalgias.  Neurological: Negative for dizziness and headaches.   Physical Exam   Blood pressure 149/98, pulse 83, temperature 98.3 F (36.8 C), resp. rate 22, height 5' 5.5" (1.664 m), weight 93.713 kg (206 lb 9.6 oz), last menstrual period 10/03/2012.  Physical Exam  Constitutional: She is oriented to person, place, and time. She appears well-developed and well-nourished. No distress (but sitting up in bed, rocking with pain).  Cardiovascular: Normal rate.   Respiratory: Effort normal.  GI: Soft. She exhibits no distension and no mass. There is tenderness (LUQ). There is guarding (mostly over LUQ). There is no rebound.  Genitourinary: Vagina normal and uterus normal. No vaginal discharge found.  Cultures collected   Musculoskeletal: Normal range of motion.  Neurological: She is alert  and oriented to person, place, and time.  Skin: Skin is warm and dry.  Psychiatric: She has a normal mood and affect.    MAU Course  Procedures  MDM Results for orders placed during the hospital encounter of 11/08/12 (from the past 72 hour(s))  URINALYSIS, ROUTINE W REFLEX MICROSCOPIC     Status: None   Collection Time    11/08/12  3:10 AM      Result Value Range   Color, Urine YELLOW  YELLOW   APPearance CLEAR  CLEAR   Specific Gravity, Urine 1.015  1.005 - 1.030   pH 7.0  5.0 - 8.0   Glucose, UA NEGATIVE  NEGATIVE mg/dL   Hgb urine dipstick NEGATIVE  NEGATIVE   Bilirubin Urine NEGATIVE  NEGATIVE   Ketones, ur NEGATIVE  NEGATIVE mg/dL   Protein, ur NEGATIVE  NEGATIVE mg/dL   Urobilinogen, UA 0.2  0.0 - 1.0 mg/dL   Nitrite  NEGATIVE  NEGATIVE   Leukocytes, UA NEGATIVE  NEGATIVE   Comment: MICROSCOPIC NOT DONE ON URINES WITH NEGATIVE PROTEIN, BLOOD, LEUKOCYTES, NITRITE, OR GLUCOSE <1000 mg/dL.  POCT PREGNANCY, URINE     Status: None   Collection Time    11/08/12  3:20 AM      Result Value Range   Preg Test, Ur NEGATIVE  NEGATIVE   Comment:            THE SENSITIVITY OF THIS     METHODOLOGY IS >24 mIU/mL  WET PREP, GENITAL     Status: Abnormal   Collection Time    11/08/12  3:35 AM      Result Value Range   Yeast Wet Prep HPF POC NONE SEEN  NONE SEEN   Trich, Wet Prep NONE SEEN  NONE SEEN   Clue Cells Wet Prep HPF POC FEW (*) NONE SEEN   WBC, Wet Prep HPF POC FEW (*) NONE SEEN   Comment: MODERATE BACTERIA SEEN  CBC WITH DIFFERENTIAL     Status: Abnormal   Collection Time    11/08/12  4:16 AM      Result Value Range   WBC 14.7 (*) 4.0 - 10.5 K/uL   RBC 4.30  3.87 - 5.11 MIL/uL   Hemoglobin 12.6  12.0 - 15.0 g/dL   HCT 16.1  09.6 - 04.5 %   MCV 87.0  78.0 - 100.0 fL   MCH 29.3  26.0 - 34.0 pg   MCHC 33.7  30.0 - 36.0 g/dL   RDW 40.9  81.1 - 91.4 %   Platelets 250  150 - 400 K/uL   Neutrophils Relative % 62  43 - 77 %   Neutro Abs 9.2 (*) 1.7 - 7.7 K/uL   Lymphocytes Relative 28  12 - 46 %   Lymphs Abs 4.2 (*) 0.7 - 4.0 K/uL   Monocytes Relative 7  3 - 12 %   Monocytes Absolute 1.0  0.1 - 1.0 K/uL   Eosinophils Relative 2  0 - 5 %   Eosinophils Absolute 0.3  0.0 - 0.7 K/uL   Basophils Relative 0  0 - 1 %   Basophils Absolute 0.1  0.0 - 0.1 K/uL  COMPREHENSIVE METABOLIC PANEL     Status: Abnormal   Collection Time    11/08/12  4:16 AM      Result Value Range   Sodium 138  135 - 145 mEq/L   Potassium 3.8  3.5 - 5.1 mEq/L   Chloride 105  96 - 112 mEq/L  CO2 23  19 - 32 mEq/L   Glucose, Bld 102 (*) 70 - 99 mg/dL   BUN 11  6 - 23 mg/dL   Creatinine, Ser 1.61  0.50 - 1.10 mg/dL   Calcium 8.9  8.4 - 09.6 mg/dL   Total Protein 6.9  6.0 - 8.3 g/dL   Albumin 3.5  3.5 - 5.2 g/dL   AST 14  0 -  37 U/L   ALT 16  0 - 35 U/L   Alkaline Phosphatase 51  39 - 117 U/L   Total Bilirubin 0.1 (*) 0.3 - 1.2 mg/dL   GFR calc non Af Amer >90  >90 mL/min   GFR calc Af Amer >90  >90 mL/min   Comment: (NOTE)     The eGFR has been calculated using the CKD EPI equation.     This calculation has not been validated in all clinical situations.     eGFR's persistently <90 mL/min signify possible Chronic Kidney     Disease.  US Pelvis Complete  11/08/2012   CLINICAL DATA:  Left lower abdominal pain. Previous tubal ligation.  EXAM: TRANSABDOMINAL AND TRANSVAGINAL ULTRASOUND OF PELVIS  TECHNIQUE: Both transabdominal and transvaginal ultrasound examinations of the pelvis were performed. Transabdominal technique was performed for global imaging of the pelvis including uterus, ovaries, adnexal regions, and pelvic cul-de-sac. It was necessary to proceed with endovaginal exam following the transabdominal exam to visualize the myometrium.  COMPARISON:  03/27/2012.  FINDINGS: Uterus  Measurements: 9.9 x 5.4 x 6.1 cm. There is a 7 x 10 mm hypoechoic intramural fibroid in the left posterolateral uterine body.  Endometrium  Thickness: 12 mm  No focal abnormality visualized.  Right ovary  Measurements: 15 x 26 x 20 mm Normal appearance/no adnexal mass.  Left ovary  Measurements: 12 x 17 x 28 mm Normal appearance/no adnexal mass.  Other findings  There is a small amount of free pelvic fluid, which may be physiologic.    IMPRESSION: Stable small uterine fibroid.  No acute abnormality.     Electronically Signed   By: Oley Balm M.D.   On: 11/08/2012 05:54   Bentyl given with good relief of pain.  Assessment and Plan  A:  Abdominal pain      Most likely related to IBS  P:  Consulted with Dr Debroah Loop who agrees with above.       Discharge home       Has Rx for Bentyl at home but was not using, urged to use it.        If worsens or does not get better, contact primary MD or ED.  [facility] 11/08/2012, 3:46 AM

## 2012-11-08 NOTE — MAU Note (Signed)
I was feeling bad earlier and took Nyquil. I woke up about ago having pain in L side of abdomen. Feels like my stomach is swelling.

## 2012-11-18 ENCOUNTER — Other Ambulatory Visit (HOSPITAL_COMMUNITY)
Admission: RE | Admit: 2012-11-18 | Discharge: 2012-11-18 | Disposition: A | Discharge status: Home / Self Care | Payer: Medicaid Other | Source: Ambulatory Visit | Attending: Obstetrics & Gynecology | Admitting: Obstetrics & Gynecology

## 2012-11-18 ENCOUNTER — Encounter: Payer: Self-pay | Admitting: Obstetrics & Gynecology

## 2012-11-18 ENCOUNTER — Ambulatory Visit (INDEPENDENT_AMBULATORY_CARE_PROVIDER_SITE_OTHER): Payer: Medicaid Other | Admitting: Obstetrics & Gynecology

## 2012-11-18 ENCOUNTER — Telehealth: Payer: Self-pay | Admitting: *Deleted

## 2012-11-18 VITALS — BP 118/81 | HR 89 | Temp 97.9°F | Ht 65.0 in | Wt 203.3 lb

## 2012-11-18 DIAGNOSIS — N949 Unspecified condition associated with female genital organs and menstrual cycle: Secondary | ICD-10-CM

## 2012-11-18 DIAGNOSIS — Z1239 Encounter for other screening for malignant neoplasm of breast: Secondary | ICD-10-CM

## 2012-11-18 DIAGNOSIS — N938 Other specified abnormal uterine and vaginal bleeding: Secondary | ICD-10-CM

## 2012-11-18 DIAGNOSIS — N920 Excessive and frequent menstruation with regular cycle: Secondary | ICD-10-CM | POA: Insufficient documentation

## 2012-11-18 LAB — POCT PREGNANCY, URINE: Preg Test, Ur: NEGATIVE

## 2012-11-18 NOTE — Patient Instructions (Signed)
Endometrial Biopsy This is a test in which a tissue sample (a biopsy) is taken from inside the uterus (womb). It is then looked at by a specialist under a microscope to see if the tissue is normal or abnormal. The endometrium is the lining of the uterus. This test helps determine where you are in your menstrual cycle and how hormone levels are affecting the lining of the uterus. Another use for this test is to diagnose endometrial cancer, tuberculosis, polyps, or inflammatory conditions and to evaluate uterine bleeding. PREPARATION FOR TEST No preparation or fasting is necessary. NORMAL FINDINGS No pathologic conditions. Presence of "secretory-type" endometrium 3 to 5 days before to normal menstruation. Ranges for normal findings may vary among different laboratories and hospitals. You should always check with your doctor after having lab work or other tests done to discuss the meaning of your test results and whether your values are considered within normal limits. MEANING OF TEST  Your caregiver will go over the test results with you and discuss the importance and meaning of your results, as well as treatment options and the need for additional tests if necessary. OBTAINING THE TEST RESULTS It is your responsibility to obtain your test results. Ask the lab or department performing the test when and how you will get your results. Document Released: 06/07/2004 Document Revised: 04/29/2011 Document Reviewed: 01/15/2008 [facility] Patient Information 2014 [place], [place].  Dysfunctional Uterine Bleeding Normally, menstrual periods begin between ages 21 to 53 in young women. A normal menstrual cycle/period may begin every 23 days up to 35 days and lasts from 1 to 7 days. Around 12 to 14 days before your menstrual period starts, ovulation (ovary produces an egg) occurs. When counting the time between menstrual periods, count from the first day of bleeding of the previous period to the first day of bleeding  of the next period. Dysfunctional (abnormal) uterine bleeding is bleeding that is different from a normal menstrual period. Your periods may come earlier or later than usual. They may be lighter, have blood clots or be heavier. You may have bleeding between periods, or you may skip one period or more. You may have bleeding after sexual intercourse, bleeding after menopause, or no menstrual period. CAUSES   Pregnancy (normal, miscarriage, tubal).  IUDs (intrauterine device, birth control).  Birth control pills.  Hormone treatment.  Menopause.  Infection of the cervix.  Blood clotting problems.  Infection of the inside lining of the uterus.  Endometriosis, inside lining of the uterus growing in the pelvis and other female organs.  Adhesions (scar tissue) inside the uterus.  Obesity or severe weight loss.  Uterine polyps inside the uterus.  Cancer of the vagina, cervix, or uterus.  Ovarian cysts or polycystic ovary syndrome.  Medical problems (diabetes, thyroid disease).  Uterine fibroids (noncancerous tumor).  Problems with your female hormones.  Endometrial hyperplasia, very thick lining and enlarged cells inside of the uterus.  Medicines that interfere with ovulation.  Radiation to the pelvis or abdomen.  Chemotherapy. DIAGNOSIS   Your doctor will discuss the history of your menstrual periods, medicines you are taking, changes in your weight, stress in your life, and any medical problems you may have.  Your doctor will do a physical and pelvic examination.  Your doctor may want to perform certain tests to make a diagnosis, such as:  Pap test.  Blood tests.  Cultures for infection.  CT scan.  Ultrasound.  Hysteroscopy.  Laparoscopy.  MRI.  Hysterosalpingography.  D and C.  Endometrial biopsy. TREATMENT  Treatment will depend on the cause of the dysfunctional uterine bleeding (DUB). Treatment may include:  Observing your menstrual periods for  a couple of months.  Prescribing medicines for medical problems, including:  Antibiotics.  Hormones.  Birth control pills.  Removing an IUD (intrauterine device, birth control).  Surgery:  D and C (scrape and remove tissue from inside the uterus).  Laparoscopy (examine inside the abdomen with a lighted tube).  Uterine ablation (destroy lining of the uterus with electrical current, laser, heat, or freezing).  Hysteroscopy (examine cervix and uterus with a lighted tube).  Hysterectomy (remove the uterus). HOME CARE INSTRUCTIONS   If medicines were prescribed, take exactly as directed. Do not change or switch medicines without consulting your caregiver.  Long term heavy bleeding may result in iron deficiency. Your caregiver may have prescribed iron pills. They help replace the iron that your body lost from heavy bleeding. Take exactly as directed.  Do not take aspirin or medicines that contain aspirin one week before or during your menstrual period. Aspirin may make the bleeding worse.  If you need to change your sanitary pad or tampon more than once every 2 hours, stay in bed with your feet elevated and a cold pack on your lower abdomen. Rest as much as possible, until the bleeding stops or slows down.  Eat well-balanced meals. Eat foods high in iron. Examples are:  Leafy green vegetables.  Whole-grain breads and cereals.  Eggs.  Meat.  Liver.  Do not try to lose weight until the abnormal bleeding has stopped and your blood iron level is back to normal. Do not lift more than ten pounds or do strenuous activities when you are bleeding.  For a couple of months, make note on your calendar, marking the start and ending of your period, and the type of bleeding (light, medium, heavy, spotting, clots or missed periods). This is for your caregiver to better evaluate your problem. SEEK MEDICAL CARE IF:   You develop nausea (feeling sick to your stomach) and vomiting, dizziness,  or diarrhea while you are taking your medicine.  You are getting lightheaded or weak.  You have any problems that may be related to the medicine you are taking.  You develop pain with your DUB.  You want to remove your IUD.  You want to stop or change your birth control pills or hormones.  You have any type of abnormal bleeding mentioned above.  You are over 54 years old and have not had a menstrual period yet.  You are 42 years old and you are still having menstrual periods.  You have any of the symptoms mentioned above.  You develop a rash. SEEK IMMEDIATE MEDICAL CARE IF:   An oral temperature above 102 F (38.9 C) develops.  You develop chills.  You are changing your sanitary pad or tampon more than once an hour.  You develop abdominal pain.  You pass out or faint. Document Released: 02/02/2000 Document Revised: 04/29/2011 Document Reviewed: 01/03/2009 [facility] Patient Information 2014 [place], [place].

## 2012-11-18 NOTE — Telephone Encounter (Signed)
Needs appointment for mammogram ( order already in) , and call patient. Prefers Tuesday late afternoon or evening appt.

## 2012-11-19 ENCOUNTER — Encounter: Payer: Self-pay | Admitting: Obstetrics & Gynecology

## 2012-11-19 NOTE — Telephone Encounter (Signed)
Mammogram appt made for 10/16 @ 540. Called patient and informed her of appt, patient verbalized understanding and had no further questions

## 2012-11-19 NOTE — Progress Notes (Signed)
GYNECOLOGY CLINIC ENCOUNTER NOTE  History:  42 y.o. [id number] here today for evaluation of abnormal uterine bleeding and a small fibroid.  She reports having very heavy menstrual periods lasting up to a week , instead of the usual 2-3 days.  During the initial days of her period, she soaks one tampon every hour and has large clots and cramping.  She was evaluated in the [facility] and sent here for an endometrial biopsy and further evaluation. Of note, she had normal TSH and hemoglobin of 12.6 recently.  However, she was seen in the [facility] on 11/08/12 for abdominal pain and a pelvic ultrasound showed normal sized uterus with a 1 cm intramural fibroid. She also had GI symptoms which were attributed to her IBS; she was encouraged to use her Bentyl and follow up with her PCP or GI doctor.  The following portions of the patient's history were reviewed and updated as appropriate: allergies, current medications, past family history, past medical history, past social history, past surgical history and problem list. Normal pap smear in 08/28/11, normal mammogram in 06/25/11.  Review of Systems:  Pertinent items are noted in HPI.  Objective:  Physical Exam BP 118/81  Pulse 89  Temp(Src) 97.9 F (36.6 C) (Oral)  Ht 5\' 5"  (1.651 m)  Wt 203 lb 4.8 oz (92.216 kg)  BMI 33.83 kg/m2  LMP 11/16/2012 Gen: NAD Abd: Soft, nontender and nondistended Pelvic: Normal appearing external genitalia; normal appearing vaginal mucosa and cervix.  Normal discharge.  Small uterus, no other palpable masses, no uterine or adnexal tenderness  ENDOMETRIAL BIOPSY     The indications for endometrial biopsy were reviewed.   Risks of the biopsy including cramping, bleeding, infection, uterine perforation, inadequate specimen and need for additional procedures  were discussed. The patient states she understands and agrees to undergo procedure today. Consent was signed. Time out was performed. Urine HCG was negative. During the pelvic exam, the  cervix was prepped with Betadine. A single-toothed tenaculum was placed on the anterior lip of the cervix to stabilize it. The 3 mm pipelle was introduced into the endometrial cavity without difficulty to a depth of 10 cm, and a moderate amount of tissue was obtained and sent to pathology. The instruments were removed from the patient's vagina. Minimal bleeding from the cervix was noted. The patient tolerated the procedure well. Routine post-procedure instructions were given to the patient.    Labs and Imaging 11/08/2012 TRANSABDOMINAL AND TRANSVAGINAL ULTRASOUND OF PELVIS  CLINICAL DATA:  Left lower abdominal pain. Previous tubal ligation.  COMPARISON:  03/27/2012.  FINDINGS: Uterus  Measurements: 9.9 x 5.4 x 6.1 cm. There is a 7 x 10 mm hypoechoic intramural fibroid in the left posterolateral uterine body.  Endometrium  Thickness: 12 mm  No focal abnormality visualized.  Right ovary  Measurements: 15 x 26 x 20 mm Normal appearance/no adnexal mass.  Left ovary  Measurements: 12 x 17 x 28 mm Normal appearance/no adnexal mass.  Other findings  There is a small amount of free pelvic fluid, which may be physiologic.  IMPRESSION: Stable small uterine fibroid.  No acute abnormality.   Electronically Signed   By: Oley Balm M.D.   On: 11/08/2012 05:54   Assessment & Plan:  Abnormal uterine bleeding s/p endometrial biopsy; will follow up results and manage accordingly. Pending a benign result, we discussed management options for abnormal uterine bleeding including oral progesterone, Depo Provera, Mirena IUD, endometrial ablation (Novasure/Hydrothermal Ablation) or hysterectomy as definitive surgical management.  Discussed risks  and benefits of each method.  Printed patient education handouts were given to the patient to review at home.  Patient in unsure of what she wants but wants to avoid hormonal treatments; will decide after endometrial biopsy results are available.  Bleeding precautions reviewed.

## 2012-11-24 ENCOUNTER — Telehealth: Payer: Self-pay | Admitting: *Deleted

## 2012-11-24 NOTE — Telephone Encounter (Addendum)
Message copied by Jill Side on Tue Nov 24, 2012 11:30 AM ------      Message from: Jaynie Collins A      Created: Fri Nov 20, 2012  9:42 PM       Benign endometrial biopsy.  Patient should decide on what she wants to do, options discussed during last visit.  Please call to inform patient of results and recommendations.       ------ Called pt and informed her of benign  (normal) endometrial biopsy result.  I further stated that Dr. Macon Large would like her to decide what she wants to do based on the options which were discussed at her last visit. She may leave a detailed message on the nurse voice mail when she has made her decision. Pt agreed and voiced understanding.

## 2012-12-03 ENCOUNTER — Ambulatory Visit (HOSPITAL_COMMUNITY)
Admission: RE | Admit: 2012-12-03 | Discharge: 2012-12-03 | Disposition: A | Discharge status: Home / Self Care | Payer: Medicaid Other | Source: Ambulatory Visit | Attending: Obstetrics & Gynecology | Admitting: Obstetrics & Gynecology

## 2012-12-03 DIAGNOSIS — Z1239 Encounter for other screening for malignant neoplasm of breast: Secondary | ICD-10-CM

## 2012-12-03 DIAGNOSIS — Z1231 Encounter for screening mammogram for malignant neoplasm of breast: Secondary | ICD-10-CM | POA: Insufficient documentation

## 2012-12-09 ENCOUNTER — Encounter: Payer: Self-pay | Admitting: *Deleted

## 2013-01-15 ENCOUNTER — Encounter (HOSPITAL_COMMUNITY): Payer: Self-pay | Admitting: Emergency Medicine

## 2013-01-15 ENCOUNTER — Emergency Department (HOSPITAL_COMMUNITY)
Admission: EM | Admit: 2013-01-15 | Discharge: 2013-01-16 | Disposition: A | Discharge status: Home / Self Care | Payer: Medicaid Other | Attending: Emergency Medicine | Admitting: Emergency Medicine

## 2013-01-15 DIAGNOSIS — R112 Nausea with vomiting, unspecified: Secondary | ICD-10-CM | POA: Insufficient documentation

## 2013-01-15 DIAGNOSIS — Z8742 Personal history of other diseases of the female genital tract: Secondary | ICD-10-CM | POA: Insufficient documentation

## 2013-01-15 DIAGNOSIS — K8020 Calculus of gallbladder without cholecystitis without obstruction: Secondary | ICD-10-CM

## 2013-01-15 DIAGNOSIS — Z8719 Personal history of other diseases of the digestive system: Secondary | ICD-10-CM | POA: Insufficient documentation

## 2013-01-15 DIAGNOSIS — Z8669 Personal history of other diseases of the nervous system and sense organs: Secondary | ICD-10-CM | POA: Insufficient documentation

## 2013-01-15 DIAGNOSIS — Z3202 Encounter for pregnancy test, result negative: Secondary | ICD-10-CM | POA: Insufficient documentation

## 2013-01-15 HISTORY — DX: Leiomyoma of uterus, unspecified: D25.9

## 2013-01-15 LAB — COMPREHENSIVE METABOLIC PANEL
ALT: 19 U/L (ref 0–35)
AST: 16 U/L (ref 0–37)
Albumin: 4.2 g/dL (ref 3.5–5.2)
Alkaline Phosphatase: 50 U/L (ref 39–117)
Calcium: 9.1 mg/dL (ref 8.4–10.5)
Chloride: 104 mEq/L (ref 96–112)
Creatinine, Ser: 0.69 mg/dL (ref 0.50–1.10)
GFR calc Af Amer: >90 mL/min (ref >90)
Glucose, Bld: 97 mg/dL (ref 70–99)
Potassium: 3.4 mEq/L — ABNORMAL LOW (ref 3.5–5.1)
Sodium: 140 mEq/L (ref 135–145)
Total Protein: 7.9 g/dL (ref 6.0–8.3)

## 2013-01-15 LAB — CBC WITH DIFFERENTIAL/PLATELET
Basophils Absolute: 0.1 10*3/uL (ref 0.0–0.1)
Basophils Relative: 0 % (ref 0–1)
Eosinophils Absolute: 0.1 10*3/uL (ref 0.0–0.7)
Eosinophils Relative: 1 % (ref 0–5)
Lymphocytes Relative: 21 % (ref 12–46)
Lymphs Abs: 2.9 10*3/uL (ref 0.7–4.0)
MCH: 30 pg (ref 26.0–34.0)
MCHC: 34.5 g/dL (ref 30.0–36.0)
MCV: 87.2 fL (ref 78.0–100.0)
Neutrophils Relative %: 74 % (ref 43–77)
Platelets: 293 10*3/uL (ref 150–400)
RBC: 4.36 MIL/uL (ref 3.87–5.11)
RDW: 13.7 % (ref 11.5–15.5)

## 2013-01-15 MED ORDER — ONDANSETRON HCL 4 MG/2ML IJ SOLN
4.0000 mg | Freq: Once | INTRAMUSCULAR | Status: AC
  Administered 2013-01-15: 4 mg via INTRAVENOUS
  Filled 2013-01-15: qty 2

## 2013-01-15 MED ORDER — MORPHINE SULFATE 4 MG/ML IJ SOLN
4.0000 mg | Freq: Once | INTRAMUSCULAR | Status: AC
  Administered 2013-01-15: 4 mg via INTRAVENOUS
  Filled 2013-01-15: qty 1

## 2013-01-15 MED ORDER — SODIUM CHLORIDE 0.9 % IV BOLUS (SEPSIS)
1000.0000 mL | Freq: Once | INTRAVENOUS | Status: AC
  Administered 2013-01-15: 1000 mL via INTRAVENOUS

## 2013-01-15 NOTE — ED Notes (Signed)
Pt is c/o upper abd pain that started yesterday  Pt states she has had vomiting associated with the pain

## 2013-01-15 NOTE — ED Provider Notes (Signed)
CSN: [id number]     Arrival date & time 01/15/13  2206 History   First MD Initiated Contact with Patient 01/15/13 2239     Chief Complaint  Patient presents with  . Abdominal Pain   (Consider location/radiation/quality/duration/timing/severity/associated sxs/prior Treatment) HPI  42 year old female with history of IBS, pyelonephritis, STD, ovarian cyst, and urine fibroid presents complaining of abdominal pain.  Patient report after eating Thanksgiving in the last night she developed acute onset of mid abdominal discomfort which radiates to the right upper quadrant. Pain is described as a achy colicky sensation, intermittent, with associate nausea and vomiting. Pain is currently a 10 out of 10, vomitus is nonbloody nonbilious. Having normal bowel movement. Vomitus has been more than 5 times since. She has appetite but unable to keep anything down. She tried over-the-counter pain medication without relief. She has never had this pain before. Nothing seems to make it better or worse. She denies fever, chills, chest pain, shortness of breath, productive cough, hemoptysis, dysuria, hematuria, hematochezia, or melena. No prior abdominal surgery.  Past Medical History  Diagnosis Date  . IBS (irritable bowel syndrome)   . Carpal tunnel syndrome   . Allergy   . Neck pain     C5-C6 mild buldge  . STD (female)     History of GC/Chlamydia during preg  . Pyelonephritis 2000  . Ovarian cyst 2004  . Alopecia     f/u with Derm-- idiopathic scarring alopecia  . Hemorrhoid 2006    Colonscopy  . Uterine fibroid    Past Surgical History  Procedure Laterality Date  . Facial fracture surgery  1993  . Tubal ligation    . Eye surgery     Family History  Problem Relation Age of Onset  . Cancer Maternal Aunt   . Cancer Maternal Uncle    History  Substance Use Topics  . Smoking status: Never Smoker   . Smokeless tobacco: Never Used  . Alcohol Use: No   OB History   Grav Para Term Preterm  Abortions TAB SAB Ect Mult Living   5 3 3  0 2 1 1  0 0 3     Review of Systems  All other systems reviewed and are negative.    Allergies  Review of patient's allergies indicates no known allergies.  Home Medications   Current Outpatient Rx  Name  Route  Sig  Dispense  Refill  . dicyclomine (BENTYL) 10 MG capsule   Oral   Take 2 capsules (20 mg total) by mouth 4 (four) times daily -  before meals and at bedtime.   60 capsule   1   . norethindrone-ethinyl estradiol 1/35 (ORTHO-NOVUM 1/35, 28,) tablet   Oral   Take 1 tablet by mouth daily. Take as directed.   1 Package   6    BP 143/97  Pulse 82  Temp(Src) 98 F (36.7 C) (Oral)  Resp 18  SpO2 100%  LMP 01/04/2013 Physical Exam  Nursing note and vitals reviewed. Constitutional: She is oriented to person, place, and time. She appears well-developed and well-nourished. No distress.  Awake, alert, nontoxic appearance  HENT:  Head: Atraumatic.  Eyes: Conjunctivae are normal. Right eye exhibits no discharge. Left eye exhibits no discharge.  Neck: Neck supple.  Cardiovascular: Normal rate and regular rhythm.   Pulmonary/Chest: Effort normal. No respiratory distress. She exhibits no tenderness.  Abdominal: Soft. She exhibits no distension. There is tenderness (Tenderness to epigastric and right upper quadrant with guarding but without rebound tenderness.  Otherwise abdomen is soft and nondistended.). There is no rigidity, no rebound, no guarding, no CVA tenderness and no tenderness at McBurney's point.  Musculoskeletal: She exhibits no tenderness.  ROM appears intact, no obvious focal weakness  Neurological: She is alert and oriented to person, place, and time.  Mental status and motor strength appears intact  Skin: No rash noted.  Psychiatric: She has a normal mood and affect.    ED Course  Procedures (including critical care time)  10:56 PM Patient with right upper quadrant abdominal tenderness concerning for biliary  disease. She appears uncomfortable but nontoxic in appearance. Pain medication and antinausea medication given.  12:38 AM Pain improves with pain medication.  Pt has elevated WBC 14.2, however this is her baseline as compared to prior values.  No left shift.  Pregnancy test is negative, electrolytes are reassuring.  Currently awaits [place] of abdomen.  Pt currently afebrile with stable normal vital sign.  Care discussed with Dr. Patria Mane.    12:45 AM Dr. Patria Mane will continue with current management.  Awaits abd [place].    Labs Review Labs Reviewed  CBC WITH DIFFERENTIAL - Abnormal; Notable for the following:    WBC 14.2 (*)    Neutro Abs 10.6 (*)    All other components within normal limits  COMPREHENSIVE METABOLIC PANEL - Abnormal; Notable for the following:    Potassium 3.4 (*)    Total Bilirubin 0.2 (*)    All other components within normal limits  LIPASE, BLOOD  URINALYSIS, ROUTINE W REFLEX MICROSCOPIC  POCT PREGNANCY, URINE   Imaging Review No results found.  EKG Interpretation   None       MDM  No diagnosis found.  BP 143/97  Pulse 82  Temp(Src) 98 F (36.7 C) (Oral)  Resp 18  SpO2 100%  LMP 01/04/2013     Fayrene Helper, PA-C 01/16/13 [phone]

## 2013-01-16 ENCOUNTER — Emergency Department (HOSPITAL_COMMUNITY): Payer: Medicaid Other

## 2013-01-16 LAB — URINALYSIS, ROUTINE W REFLEX MICROSCOPIC
Bilirubin Urine: NEGATIVE
Hgb urine dipstick: NEGATIVE
Specific Gravity, Urine: 1.02 (ref 1.005–1.030)
Urobilinogen, UA: 0.2 mg/dL (ref 0.0–1.0)
pH: 8 (ref 5.0–8.0)

## 2013-01-16 LAB — URINE MICROSCOPIC-ADD ON

## 2013-01-16 LAB — POCT PREGNANCY, URINE: Preg Test, Ur: NEGATIVE

## 2013-01-16 MED ORDER — HYDROCODONE-ACETAMINOPHEN 5-325 MG PO TABS: 1.0000 | ORAL_TABLET | ORAL | Status: DC | PRN

## 2013-01-16 MED ORDER — ONDANSETRON 8 MG PO TBDP: 8.0000 mg | ORAL_TABLET | Freq: Three times a day (TID) | ORAL | Status: DC | PRN

## 2013-01-16 NOTE — ED Provider Notes (Signed)
Medical screening examination/treatment/procedure(s) were conducted as a shared visit with non-physician practitioner(s) and myself.  I personally evaluated the patient during the encounter.  EKG Interpretation   None       US Abdomen Complete  01/16/2013   CLINICAL DATA:  Abdominal pain and vomiting.  EXAM: ULTRASOUND ABDOMEN COMPLETE  COMPARISON:  None.  FINDINGS: Gallbladder:  Gallbladder contraction. The gallbladder is filled with multiple stones representing wall echo shadow complex. Gallbladder wall is not thickened and Murphy's sign is negative. Changes are nonspecific.  Common bile duct:  Diameter: 2.5 mm, normal  Liver:  No focal lesion identified. Within normal limits in parenchymal echogenicity.  IVC:  No abnormality visualized.  Pancreas:  Visualized portion unremarkable.  Spleen:  Size and appearance within normal limits.  Right Kidney:  Length: 11.5 cm. Echogenicity within normal limits. No mass or hydronephrosis visualized.  Left Kidney:  Length: 11.1 cm. Echogenicity within normal limits. No mass or hydronephrosis visualized.  Abdominal aorta:  No aneurysm visualized.  Other findings:  None.  IMPRESSION: The gallbladder is contracted and diffusely filled with stones creating wall echo shadow complex. No wall thickening and Murphy's sign is negative.   Electronically Signed   By: Burman Nieves M.D.   On: 01/16/2013 01:32  I personally reviewed the imaging tests through PACS system I reviewed available ER/hospitalization records through the EMR  Results for orders placed during the hospital encounter of 01/15/13  CBC WITH DIFFERENTIAL      Result Value Range   WBC 14.2 (*) 4.0 - 10.5 K/uL   RBC 4.36  3.87 - 5.11 MIL/uL   Hemoglobin 13.1  12.0 - 15.0 g/dL   HCT 30.8  65.7 - 84.6 %   MCV 87.2  78.0 - 100.0 fL   MCH 30.0  26.0 - 34.0 pg   MCHC 34.5  30.0 - 36.0 g/dL   RDW 96.2  95.2 - 84.1 %   Platelets 293  150 - 400 K/uL   Neutrophils Relative % 74  43 - 77 %   Neutro Abs  10.6 (*) 1.7 - 7.7 K/uL   Lymphocytes Relative 21  12 - 46 %   Lymphs Abs 2.9  0.7 - 4.0 K/uL   Monocytes Relative 4  3 - 12 %   Monocytes Absolute 0.6  0.1 - 1.0 K/uL   Eosinophils Relative 1  0 - 5 %   Eosinophils Absolute 0.1  0.0 - 0.7 K/uL   Basophils Relative 0  0 - 1 %   Basophils Absolute 0.1  0.0 - 0.1 K/uL  COMPREHENSIVE METABOLIC PANEL      Result Value Range   Sodium 140  135 - 145 mEq/L   Potassium 3.4 (*) 3.5 - 5.1 mEq/L   Chloride 104  96 - 112 mEq/L   CO2 23  19 - 32 mEq/L   Glucose, Bld 97  70 - 99 mg/dL   BUN 11  6 - 23 mg/dL   Creatinine, Ser 3.24  0.50 - 1.10 mg/dL   Calcium 9.1  8.4 - 40.1 mg/dL   Total Protein 7.9  6.0 - 8.3 g/dL   Albumin 4.2  3.5 - 5.2 g/dL   AST 16  0 - 37 U/L   ALT 19  0 - 35 U/L   Alkaline Phosphatase 50  39 - 117 U/L   Total Bilirubin 0.2 (*) 0.3 - 1.2 mg/dL   GFR calc non Af Amer >90  >90 mL/min   GFR calc Af Amer >  90  >90 mL/min  LIPASE, BLOOD      Result Value Range   Lipase 26  11 - 59 U/L  URINALYSIS, ROUTINE W REFLEX MICROSCOPIC      Result Value Range   Color, Urine YELLOW  YELLOW   APPearance CLOUDY (*) CLEAR   Specific Gravity, Urine 1.020  1.005 - 1.030   pH 8.0  5.0 - 8.0   Glucose, UA NEGATIVE  NEGATIVE mg/dL   Hgb urine dipstick NEGATIVE  NEGATIVE   Bilirubin Urine NEGATIVE  NEGATIVE   Ketones, ur NEGATIVE  NEGATIVE mg/dL   Protein, ur NEGATIVE  NEGATIVE mg/dL   Urobilinogen, UA 0.2  0.0 - 1.0 mg/dL   Nitrite NEGATIVE  NEGATIVE   Leukocytes, UA SMALL (*) NEGATIVE  URINE MICROSCOPIC-ADD ON      Result Value Range   Squamous Epithelial / LPF RARE  RARE   WBC, UA 0-2  <3 WBC/hpf   Urine-Other AMORPHOUS URATES/PHOSPHATES    POCT PREGNANCY, URINE      Result Value Range   Preg Test, Ur NEGATIVE  NEGATIVE   1:48 AM Feels much better at this time.  Discharge home.  This appears to be a presentation of biliary colic with a new diagnosis of cholelithiasis on ultrasound.  No right upper quadrant tenderness on  examination this time.  Doubt cholecystitis.  White count noted.  Patient instructed to return to the ER if she develops worsening pain, fever, severe nausea vomiting uncontrolled by medicine at home.  All questions answered  Lyanne Co, MD 01/16/13 [phone]

## 2013-01-19 ENCOUNTER — Encounter: Payer: Self-pay | Admitting: Sports Medicine

## 2013-01-19 ENCOUNTER — Ambulatory Visit (INDEPENDENT_AMBULATORY_CARE_PROVIDER_SITE_OTHER): Payer: Medicaid Other | Admitting: Sports Medicine

## 2013-01-19 VITALS — BP 116/76 | HR 87 | Temp 98.7°F | Ht 65.0 in | Wt 201.7 lb

## 2013-01-19 DIAGNOSIS — E669 Obesity, unspecified: Secondary | ICD-10-CM

## 2013-01-19 DIAGNOSIS — K8021 Calculus of gallbladder without cholecystitis with obstruction: Secondary | ICD-10-CM

## 2013-01-19 DIAGNOSIS — K8050 Calculus of bile duct without cholangitis or cholecystitis without obstruction: Secondary | ICD-10-CM | POA: Insufficient documentation

## 2013-01-19 DIAGNOSIS — K589 Irritable bowel syndrome without diarrhea: Secondary | ICD-10-CM

## 2013-01-19 DIAGNOSIS — K8020 Calculus of gallbladder without cholecystitis without obstruction: Secondary | ICD-10-CM

## 2013-01-19 MED ORDER — HYDROMORPHONE HCL 2 MG PO TABS: 2.0000 mg | ORAL_TABLET | Freq: Four times a day (QID) | ORAL | Status: DC | PRN

## 2013-01-19 NOTE — Progress Notes (Signed)
  Katelyn Brown - 42 y.o. female MRN [id number]  Date of birth: 07-13-1970  CC, HPI, INTERVAL HISTORY & ROS  Moxie is here today for hospital follow up for RUQ pain    She reports pain occurred over Thanksgiving.  RUQ cramping.    + Vomiting with Chills,   + reoccurrence of pain since ED visit  Taken Diluadid and hydrocodone.  Dilaudid helps  Seen in ED Dx with cholelithiasis  Nonbilious nonbloody vomiting.  No melena or hematochezia.  History of chronic recurrent abdominal pain with known IBS and uterine fibroids.  No cardiac or pulmonary history.  No problems with anesthesia in the past.  Total duration of symptoms >2 years.  No current fevers, chills.  Long standing hx of abdominal pain, cramping and pelvic discomfort.   Pain does not radiate to back  No chest pain or dyspnea  No weight loss  History  Past Medical, Surgical, Social, and Family History Reviewed per EMR Medications and Allergies reviewed and all updated if necessary. Objective Findings  VITALS: HR: 87 bpm  BP: 116/76 mmHg  TEMP: 98.7 F (37.1 C) (Oral)  RESP:    HT: 5\' 5"  (165.1 cm)  WT: 201 lb 11.2 oz (91.491 kg)  BMI: 33.6   BP Readings from Last 3 Encounters:  01/19/13 116/76  01/16/13 136/86  11/18/12 118/81   Wt Readings from Last 3 Encounters:  01/19/13 201 lb 11.2 oz (91.491 kg)  11/18/12 203 lb 4.8 oz (92.216 kg)  11/08/12 206 lb 9.6 oz (93.713 kg)     PHYSICAL EXAM: GENERAL: Adult African American  female. In no discomfort; no respiratory distress  PSYCH: alert and appropriate, good insight   HNEENT:   CARDIO: RRR, S1/S2 heard, no murmur  LUNGS: CTA B, no wheezes, no crackles  ABDOMEN: +BS, soft diffusely tender with generalized voluntary guarding, no rigidity, no rebound, negative rosvings, negative murphy's, negative mcburney's  EXTREM:  Warm, well perfused.  Moves all 4 extremities spontaneously; no lateralization.Distal pulses 2+.  no pretibial edema.  GU:   SKIN:  No rashes    Assessment & Plan   Problems addressed today: General Plan & Pt Instructions:  1. Biliary colic   2. Obesity, unspecified   3. Irritable bowel syndrome       Refer to Gen Surg, they will call you  Refilled Dilaudid      For further discussion of A/P and for follow up issues see problem based charting if applicable.

## 2013-01-19 NOTE — Patient Instructions (Addendum)
   Refer to Gen Surg, they will call you  Refilled Dilaudid    If you need anything prior to your next visit please call the clinic. Please Bring all medications or accurate medication list with you to each appointment; an accurate medication list is essential in providing you the best care possible.

## 2013-01-19 NOTE — Assessment & Plan Note (Signed)
Recurrent biliary colic.  Recently seen 11/28 for acute exacerbation.  Diagnosed with significant cholelithiasis.  No evidence of acute cholecystitis.  Consistent today on exam.  Continues to have intermittent pain. Refill for Dilaudid as this has been the only thing to help her. Low cardiac risk for surgery.   Followup with general surgery for elective cholecystectomy.

## 2013-02-01 ENCOUNTER — Ambulatory Visit: Payer: Medicaid Other | Admitting: Family Medicine

## 2013-02-07 ENCOUNTER — Emergency Department (HOSPITAL_COMMUNITY)
Admission: EM | Admit: 2013-02-07 | Discharge: 2013-02-07 | Disposition: A | Discharge status: Home / Self Care | Payer: Medicaid Other | Attending: Emergency Medicine | Admitting: Emergency Medicine

## 2013-02-07 ENCOUNTER — Emergency Department (HOSPITAL_COMMUNITY): Payer: Medicaid Other

## 2013-02-07 ENCOUNTER — Encounter (HOSPITAL_COMMUNITY): Payer: Self-pay | Admitting: Emergency Medicine

## 2013-02-07 DIAGNOSIS — R102 Pelvic and perineal pain: Secondary | ICD-10-CM

## 2013-02-07 DIAGNOSIS — Z87448 Personal history of other diseases of urinary system: Secondary | ICD-10-CM | POA: Insufficient documentation

## 2013-02-07 DIAGNOSIS — Z8679 Personal history of other diseases of the circulatory system: Secondary | ICD-10-CM | POA: Insufficient documentation

## 2013-02-07 DIAGNOSIS — N949 Unspecified condition associated with female genital organs and menstrual cycle: Secondary | ICD-10-CM | POA: Insufficient documentation

## 2013-02-07 DIAGNOSIS — Z8669 Personal history of other diseases of the nervous system and sense organs: Secondary | ICD-10-CM | POA: Insufficient documentation

## 2013-02-07 DIAGNOSIS — Z8619 Personal history of other infectious and parasitic diseases: Secondary | ICD-10-CM | POA: Insufficient documentation

## 2013-02-07 DIAGNOSIS — Z3202 Encounter for pregnancy test, result negative: Secondary | ICD-10-CM | POA: Insufficient documentation

## 2013-02-07 DIAGNOSIS — Z8719 Personal history of other diseases of the digestive system: Secondary | ICD-10-CM | POA: Insufficient documentation

## 2013-02-07 LAB — CBC WITH DIFFERENTIAL/PLATELET
Basophils Absolute: 0.1 10*3/uL (ref 0.0–0.1)
Eosinophils Absolute: 0.3 10*3/uL (ref 0.0–0.7)
Eosinophils Relative: 2 % (ref 0–5)
Lymphocytes Relative: 33 % (ref 12–46)
MCH: 29.9 pg (ref 26.0–34.0)
MCHC: 34.3 g/dL (ref 30.0–36.0)
MCV: 87.2 fL (ref 78.0–100.0)
Monocytes Absolute: 0.9 10*3/uL (ref 0.1–1.0)
Neutrophils Relative %: 58 % (ref 43–77)
Platelets: 276 10*3/uL (ref 150–400)
RBC: 4.45 MIL/uL (ref 3.87–5.11)
RDW: 13.7 % (ref 11.5–15.5)
WBC: 13.1 10*3/uL — ABNORMAL HIGH (ref 4.0–10.5)

## 2013-02-07 LAB — URINALYSIS, ROUTINE W REFLEX MICROSCOPIC
Glucose, UA: NEGATIVE mg/dL
Hgb urine dipstick: NEGATIVE
Protein, ur: NEGATIVE mg/dL
Specific Gravity, Urine: 1.023 (ref 1.005–1.030)
Urobilinogen, UA: 0.2 mg/dL (ref 0.0–1.0)
pH: 6.5 (ref 5.0–8.0)

## 2013-02-07 LAB — COMPREHENSIVE METABOLIC PANEL
ALT: 15 U/L (ref 0–35)
AST: 12 U/L (ref 0–37)
Albumin: 4 g/dL (ref 3.5–5.2)
Alkaline Phosphatase: 46 U/L (ref 39–117)
CO2: 24 mEq/L (ref 19–32)
Calcium: 9.3 mg/dL (ref 8.4–10.5)
GFR calc Af Amer: >90 mL/min (ref >90)
Potassium: 3.6 mEq/L (ref 3.5–5.1)
Sodium: 137 mEq/L (ref 135–145)
Total Protein: 7.7 g/dL (ref 6.0–8.3)

## 2013-02-07 LAB — POCT PREGNANCY, URINE: Preg Test, Ur: NEGATIVE

## 2013-02-07 MED ORDER — HYDROMORPHONE HCL PF 1 MG/ML IJ SOLN
1.0000 mg | Freq: Once | INTRAMUSCULAR | Status: AC
  Administered 2013-02-07: 1 mg via INTRAVENOUS
  Filled 2013-02-07: qty 1

## 2013-02-07 MED ORDER — SODIUM CHLORIDE 0.9 % IV SOLN
1000.0000 mL | INTRAVENOUS | Status: DC
  Administered 2013-02-07: 1000 mL via INTRAVENOUS

## 2013-02-07 MED ORDER — SODIUM CHLORIDE 0.9 % IV SOLN
1000.0000 mL | Freq: Once | INTRAVENOUS | Status: AC
  Administered 2013-02-07: 1000 mL via INTRAVENOUS

## 2013-02-07 MED ORDER — IOHEXOL 300 MG/ML  SOLN
100.0000 mL | Freq: Once | INTRAMUSCULAR | Status: AC | PRN
  Administered 2013-02-07: 100 mL via INTRAVENOUS

## 2013-02-07 MED ORDER — IOHEXOL 300 MG/ML  SOLN
50.0000 mL | Freq: Once | INTRAMUSCULAR | Status: AC | PRN
  Administered 2013-02-07: 50 mL via ORAL

## 2013-02-07 NOTE — ED Notes (Signed)
PT BACK FROM CT

## 2013-02-07 NOTE — ED Provider Notes (Signed)
Medical screening examination/treatment/procedure(s) were performed by non-physician practitioner and as supervising physician I was immediately available for consultation/collaboration.  EKG Interpretation   None       Devoria Albe, MD, Armando Gang   Ward Givens, MD 02/07/13 2025

## 2013-02-07 NOTE — ED Notes (Signed)
Pt ambulatory to the bathroom. Pt c/o of pain in back and side while walking

## 2013-02-07 NOTE — ED Notes (Addendum)
Pt reports acute pain for the last 20 days, referring to her last visit with same complaint. Pt reports increased pain and radiating more to the left flank and inability to carry on with her ADL's as her reason for seeking care today. No apparent signs of distress, reports SOB and dizzness with activity. Denies fever/chills.

## 2013-02-07 NOTE — ED Provider Notes (Signed)
CSN: [id number]     Arrival date & time 02/07/13  1652 History   First MD Initiated Contact with Patient 02/07/13 1725     Chief Complaint  Patient presents with  . Abdominal Pain   (Consider location/radiation/quality/duration/timing/severity/associated sxs/prior Treatment) HPI  This is a 42 year old female presents to the emergency department with chief complaint of abdominal pain.  Patient has a history of chronic abdominal pain.  [facility] as here in the emergency department.  She is followed at family medicine.  The patient was last seen approximately one month ago here in the emergency department with complaint of severe epigastric and right upper quadrant abdominal pain.  She was diagnosed with cholelithiasis without cholecystitis.  She at that time had profuse nausea and vomiting.  The patient has been seen since with family medicine and has a scheduled consultation with Dr. Dwain Sarna in mid January at 2015.  The patient has complained of severe pain in the left lower abdominal quadrant.  She states that it radiates around to her back.  She denies any urinary symptoms.  Patient states that her pain is constant, severe.  Patient has been taking 2 mg tablet every 6 hours.  She states that the pain interrupted her activities of daily living.  She is unable to get more than 2-3 hours of sleep a night.  She complains of constant nausea without vomiting.  She complains of green-colored stools.  She denies any melena or hematochezia.  The patient has a known 1 cm intramural uterine fibroids and dysfunctional uterine bleeding with menorrhagia.  And evaluated several times in a stable. The patient also has right upper quadrant pain but denies any vomiting at this time.  Past Medical History  Diagnosis Date  . IBS (irritable bowel syndrome)   . Carpal tunnel syndrome   . Allergy   . Neck pain     C5-C6 mild buldge  . STD  (female)     History of GC/Chlamydia during preg  . Pyelonephritis 2000  . Ovarian cyst 2004  . Alopecia     f/u with Derm-- idiopathic scarring alopecia  . Hemorrhoid 2006    Colonscopy  . Uterine fibroid    Past Surgical History  Procedure Laterality Date  . Facial fracture surgery  1993  . Tubal ligation    . Eye surgery     Family History  Problem Relation Age of Onset  . Cancer Maternal Aunt   . Cancer Maternal Uncle    History  Substance Use Topics  . Smoking status: Never Smoker   . Smokeless tobacco: Never Used  . Alcohol Use: 0.0 oz/week     Comment: Occasional   OB History   Grav Para Term Preterm Abortions TAB SAB Ect Mult Living   5 3 3  0 2 1 1  0 0 3     Review of Systems  Constitutional: Negative for fever, chills and fatigue.  HENT: Negative for trouble swallowing.   Respiratory: Negative for shortness of breath.   Cardiovascular: Negative for chest pain.  Gastrointestinal: Negative for nausea, vomiting, abdominal pain, diarrhea and constipation.  Genitourinary: Positive for flank pain, menstrual problem and pelvic pain. Negative for dysuria, frequency, hematuria, vaginal discharge and vaginal pain.  Musculoskeletal: Negative for arthralgias and myalgias.  Skin: Negative for rash.  Neurological: Negative for numbness.  Hematological: Does not bruise/bleed easily.  All other systems reviewed and are negative.  Allergies  Review of patient's allergies indicates no known allergies.  Home Medications   Current Outpatient Rx  Name  Route  Sig  Dispense  Refill  . acetaminophen (TYLENOL) 325 MG tablet   Oral   Take 650 mg by mouth every 6 (six) hours as needed (pain).         Marland Kitchen HYDROcodone-acetaminophen (NORCO/VICODIN) 5-325 MG per tablet   Oral   Take 1 tablet by mouth every 4 (four) hours as needed for moderate pain.   15 tablet   0   . HYDROmorphone (DILAUDID) 2 MG tablet   Oral   Take 1 tablet (2 mg total) by mouth every 6 (six) hours  as needed for severe pain.   30 tablet   0    BP 131/71  Pulse 85  Temp(Src) 98.5 F (36.9 C) (Oral)  Resp 18  SpO2 100%  LMP 01/04/2013 Physical Exam  Constitutional: She is oriented to person, place, and time. She appears well-developed and well-nourished. No distress.  tearful  HENT:  Head: Normocephalic and atraumatic.  Eyes: Conjunctivae are normal. No scleral icterus.  Neck: Normal range of motion.  Cardiovascular: Normal rate, regular rhythm and normal heart sounds.  Exam reveals no gallop and no friction rub.   No murmur heard. Pulmonary/Chest: Effort normal and breath sounds normal. No respiratory distress.  Abdominal: Soft. Bowel sounds are normal. She exhibits no distension and no mass. There is tenderness. There is no guarding.  CVA tenderness.  Tender left lower abdominal quadrant.  Mild tenderness of the right upper quadrant without guarding.  Neurological: She is alert and oriented to person, place, and time.  Skin: Skin is warm and dry. She is not diaphoretic.    ED Course  Procedures (including critical care time) Labs Review Labs Reviewed  CBC WITH DIFFERENTIAL - Abnormal; Notable for the following:    WBC 13.1 (*)    Lymphs Abs 4.3 (*)    All other components within normal limits  COMPREHENSIVE METABOLIC PANEL - Abnormal; Notable for the following:    Total Bilirubin 0.2 (*)    All other components within normal limits  LIPASE, BLOOD  URINALYSIS, ROUTINE W REFLEX MICROSCOPIC  POCT PREGNANCY, URINE   Imaging Review No results found.  EKG Interpretation   None       MDM  No diagnosis found. Patient with elevation in her white blood cell count that is to be at baseline for patient.  No other lab abnormalities found today. Patient's CT scan shows reflux being into the left ovarian vein.  This may be consistent with a diagnosis of pelvic congestion syndrome.  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  Her pain is worsened with long  periods of standing.  The patient also has a history of 6 pregnancies. Discussed this diagnosis with the patient.  And have advised her that she should follow up closely with the faculty practice at [facility] hospital.  Patient has pain medications at home she is to continue.  As the patient has had this pain for the past month and has had complaints similar to past I do not feel that she has ovarian torsion in.  She's had multiple lesions with ultrasound.  She has no vaginal or urinary complaints.  Patient does have follow up with Dr. Dwain Sarna at [facility] surgery.  At this point feel the patient is safe for discharge.      Arthor Captain, PA-C 02/07/13 2015

## 2013-02-08 ENCOUNTER — Inpatient Hospital Stay (HOSPITAL_COMMUNITY)
Admission: AD | Admit: 2013-02-08 | Discharge: 2013-02-08 | Disposition: A | Discharge status: Home / Self Care | Payer: Medicaid Other | Source: Ambulatory Visit | Attending: Obstetrics & Gynecology | Admitting: Obstetrics & Gynecology

## 2013-02-08 ENCOUNTER — Encounter (HOSPITAL_COMMUNITY): Payer: Self-pay | Admitting: *Deleted

## 2013-02-08 DIAGNOSIS — K589 Irritable bowel syndrome without diarrhea: Secondary | ICD-10-CM | POA: Insufficient documentation

## 2013-02-08 DIAGNOSIS — D259 Leiomyoma of uterus, unspecified: Secondary | ICD-10-CM | POA: Insufficient documentation

## 2013-02-08 DIAGNOSIS — K8020 Calculus of gallbladder without cholecystitis without obstruction: Secondary | ICD-10-CM | POA: Insufficient documentation

## 2013-02-08 DIAGNOSIS — N938 Other specified abnormal uterine and vaginal bleeding: Secondary | ICD-10-CM | POA: Insufficient documentation

## 2013-02-08 DIAGNOSIS — N949 Unspecified condition associated with female genital organs and menstrual cycle: Secondary | ICD-10-CM | POA: Insufficient documentation

## 2013-02-08 DIAGNOSIS — R109 Unspecified abdominal pain: Secondary | ICD-10-CM | POA: Insufficient documentation

## 2013-02-08 DIAGNOSIS — R102 Pelvic and perineal pain: Secondary | ICD-10-CM

## 2013-02-08 LAB — WET PREP, GENITAL
Clue Cells Wet Prep HPF POC: NONE SEEN
Yeast Wet Prep HPF POC: NONE SEEN

## 2013-02-08 MED ORDER — KETOROLAC TROMETHAMINE 60 MG/2ML IM SOLN
60.0000 mg | Freq: Once | INTRAMUSCULAR | Status: AC
  Administered 2013-02-08: 60 mg via INTRAMUSCULAR
  Filled 2013-02-08 (×2): qty 2

## 2013-02-08 NOTE — MAU Note (Signed)
Patient states she has a history of fibroids. Was seen at [facility] last night. States she has also been diagnosed with gall stones. Has been having left lower abdominal pain radiating around to the back for about one month. Pain medication is not helping at all. Denies bleeding or vaginal discharge.

## 2013-02-08 NOTE — MAU Provider Note (Signed)
History     CSN: [id number]  Arrival date and time: 02/08/13 1106   First Provider Initiated Contact with Patient 02/08/13 1228      Chief Complaint  Patient presents with  . Pelvic Pain   HPI Ms. Katelyn Brown is a 42 y.o. [id number] who presents to MAU today with complaint of diffuse abdominal pain that is worse in the LLQ. The patient has a long standing history of IBS that is managed at [facility]. She was seen by [facility] clinic in October and found to have one small fibroid, DUB and a normal endometrial biopsy. The patient was told that her results were normal and she should choose one of the options discussed at her visit for management of her DUB. The patient states that she was trying to decide what option to choose when she was diagnosed with gallstones. The patient was referred to [facility] for management and has an appointment on 03/04/12. The patient went to [facility] last night for the same pain and had a CT of the abdomen and pelvis which showed refluxing of the ovarian vein that could indicate pelvic congestion syndrome. The patient has been given Vicodin and Dilaudid Rx previously. She last took some on Thursday. She states her pain is severe now. She denies vaginal bleeding, discharge, fever, N/V/D or constipation.   OB History   Grav Para Term Preterm Abortions TAB SAB Ect Mult Living   5 3 3  0 2 1 1  0 0 3      Past Medical History  Diagnosis Date  . IBS (irritable bowel syndrome)   . Carpal tunnel syndrome   . Allergy   . Neck pain     C5-C6 mild buldge  . STD (female)     History of GC/Chlamydia during preg  . Pyelonephritis 2000  . Ovarian cyst 2004  . Alopecia     f/u with Derm-- idiopathic scarring alopecia  . Hemorrhoid 2006    Colonscopy  . Uterine fibroid     Past Surgical History  Procedure Laterality Date  . Facial fracture surgery  1993  . Tubal ligation    . Eye surgery      Family History  Problem Relation Age of Onset  . Cancer  Maternal Aunt   . Cancer Maternal Uncle     History  Substance Use Topics  . Smoking status: Never Smoker   . Smokeless tobacco: Never Used  . Alcohol Use: 0.0 oz/week     Comment: Occasional    Allergies: No Known Allergies  No prescriptions prior to admission    Review of Systems  Constitutional: Negative for fever.  Gastrointestinal: Positive for abdominal pain. Negative for nausea, vomiting, diarrhea and constipation.  Genitourinary: Negative for dysuria, urgency and frequency.       Neg - vaginal bleeding, discharge  Neurological: Positive for dizziness. Negative for loss of consciousness.   Physical Exam   Blood pressure 130/77, pulse 75, temperature 99 F (37.2 C), temperature source Oral, resp. rate 16, height 5\' 5"  (1.651 m), weight 202 lb 3.2 oz (91.717 kg), last menstrual period 01/04/2013, SpO2 100.00%.  Physical Exam  Constitutional: She is oriented to person, place, and time. She appears well-developed and well-nourished. No distress.  HENT:  Head: Normocephalic and atraumatic.  Cardiovascular: Normal rate.   Respiratory: Effort normal.  GI: Soft. Bowel sounds are normal. She exhibits no distension and no mass. There is tenderness (moderate diffuse tenderness to palpation). There is no rebound and no guarding.  Genitourinary: Uterus is not enlarged and not tender. Cervix exhibits no motion tenderness, no discharge and no friability. Right adnexum displays no mass and no tenderness. Left adnexum displays no mass and no tenderness. No bleeding around the vagina. Vaginal discharge (scant thin, white discharge noted) found.  Neurological: She is alert and oriented to person, place, and time.  Skin: Skin is warm and dry. No erythema.  Psychiatric: She has a normal mood and affect.   Results for orders placed during the hospital encounter of 02/08/13 (from the past 24 hour(s))  WET PREP, GENITAL     Status: Abnormal   Collection Time    02/08/13  3:22 PM       Result Value Range   Yeast Wet Prep HPF POC NONE SEEN  NONE SEEN   Trich, Wet Prep NONE SEEN  NONE SEEN   Clue Cells Wet Prep HPF POC NONE SEEN  NONE SEEN   WBC, Wet Prep HPF POC MANY (*) NONE SEEN    MAU Course  Procedures None  MDM Patient offered 60 mg Toradol for pain. She refused without discussion with me. I went to discuss the course of management with the patient and she requested a pen and paper to keep track of all of our names because she was very frustrated. I called Dr. Marice Potter to come and discuss the possible diagnosis of pelvic congestion syndrome with the patient. She came and spoke with the patient. The patient voiced her frustrations, which were mostly with previous visits to the ED and with a delay in the referral to [facility] Surgery. The patient has an appointment with [facility] clinic for follow-up on 03/01/13. The patient brought her discharge instructions from [facility] last night with her this morning. She states that the provider there told her that [facility] might be able to do a procedure to "fix" her pelvic congestion syndrome. The paperwork mentions venogram with possible embolization. Dr. Marice Potter would like patient to be referred to interventional radiology if this is something they are capable of performing. Dr. Marice Potter also told the patient that she would need to have laparoscopy scheduled in order for [place] to determine if she truly has pelvic congestion syndrome. The patient voiced understanding. I have called radiology and Dr. Earlene Plater gave to the contact information for IR (Dr. Georgeann Oppenheim). I spoke with Dr. Georgeann Oppenheim who reviewed the patient's imaging from yesterday and recommended a consult in the office at [facility] imaging. I called  imaging and was able to make an appointment for a consult on 02/24/13 @ 10:15 am. The patient was given the contact information for [facility] imaging and they will also send the patient information in the mail about her upcoming appointment. The patient  eventually agreed to try the IM Toradol which she states helped somewhat. The patient states that she has "plenty of pain medication" at home to last her until her follow-up appointment. I also advised her that Ibuprofen could be used for breakthrough pain or for pain control when she has to work. The patient voiced understanding. Nicki Reaper also spoke with the patient in order to hear her frustrations and create an appropriate plan for follow-up.   Assessment and Plan  A: Pelvic pain Gallstones IBS  P: Discharge home Patient advised to continue previously prescribed pain medication PRN pain Patient advised to keep all follow-up appointments as scheduled as well as schedule follow-up with her PCP Patient may return to MAU as needed or if her condition were to change or worsen  Freddi Starr, PA-C  02/08/2013, 5:49 PM

## 2013-02-18 HISTORY — PX: EMBOLIZATION: SHX1496

## 2013-02-24 ENCOUNTER — Other Ambulatory Visit: Payer: Self-pay | Admitting: Medical

## 2013-02-24 ENCOUNTER — Inpatient Hospital Stay: Admit: 2013-02-24 | Payer: Medicaid Other

## 2013-02-24 ENCOUNTER — Ambulatory Visit
Admission: RE | Admit: 2013-02-24 | Discharge: 2013-02-24 | Disposition: A | Discharge status: Home / Self Care | Payer: Medicaid Other | Source: Ambulatory Visit | Attending: Medical | Admitting: Medical

## 2013-02-24 ENCOUNTER — Other Ambulatory Visit: Payer: Medicaid Other

## 2013-02-24 DIAGNOSIS — R102 Pelvic and perineal pain: Secondary | ICD-10-CM

## 2013-03-01 ENCOUNTER — Encounter: Payer: Self-pay | Admitting: Obstetrics & Gynecology

## 2013-03-01 ENCOUNTER — Ambulatory Visit (INDEPENDENT_AMBULATORY_CARE_PROVIDER_SITE_OTHER): Payer: Medicaid Other | Admitting: Obstetrics & Gynecology

## 2013-03-01 ENCOUNTER — Other Ambulatory Visit (HOSPITAL_COMMUNITY): Payer: Self-pay | Admitting: Interventional Radiology

## 2013-03-01 VITALS — BP 130/81 | HR 83 | Temp 98.1°F | Ht 65.0 in | Wt 202.8 lb

## 2013-03-01 DIAGNOSIS — N9489 Other specified conditions associated with female genital organs and menstrual cycle: Secondary | ICD-10-CM

## 2013-03-01 DIAGNOSIS — N938 Other specified abnormal uterine and vaginal bleeding: Secondary | ICD-10-CM

## 2013-03-01 DIAGNOSIS — N949 Unspecified condition associated with female genital organs and menstrual cycle: Secondary | ICD-10-CM

## 2013-03-01 HISTORY — DX: Other specified conditions associated with female genital organs and menstrual cycle: N94.89

## 2013-03-01 MED ORDER — MELOXICAM 7.5 MG PO TABS: 7.5000 mg | ORAL_TABLET | Freq: Every day | ORAL | Status: DC

## 2013-03-01 NOTE — Patient Instructions (Addendum)
Return to clinic for any obstetric concerns or go to MAU for evaluation Intrauterine Device Insertion Most often, an intrauterine device (IUD) is inserted into the uterus to prevent pregnancy. There are 2 types of IUDs available:  Copper IUD This type of IUD creates an environment that is not favorable to sperm survival. The mechanism of action of the copper IUD is not known for certain. It can stay in place for 10 years.  Mirena/Hormone IUD This type of IUD contains the hormone progestin (synthetic progesterone). The progestin thickens the cervical mucus and prevents sperm from entering the uterus, and it also thins the uterine lining. There is no evidence that the hormone IUD prevents implantation. One hormone IUD can stay in place for up to 5 years, and a different hormone IUD can stay in place for up to 3 years. An IUD is the most cost-effective birth control if left in place for the full duration. It may be removed at any time. LET [facility] CARE PROVIDER KNOW ABOUT:  Any allergies you have.  All medicines you are taking, including vitamins, herbs, eye drops, creams, and over-the-counter medicines.  Previous problems you or members of your family have had with the use of anesthetics.  Any blood disorders you have.  Previous surgeries you have had.  Possibility of pregnancy.  Medical conditions you have. RISKS AND COMPLICATIONS  Generally, intrauterine device insertion is a safe procedure. However, as with any procedure, complications can occur. Possible complications include:  Accidental puncture (perforation) of the uterus.  Accidental placement of the IUD either in the muscle layer of the uterus (myometrium) or outside the uterus. If this happens, the IUD can be found essentially floating around the bowels and must be taken out surgically.  The IUD may fall out of the uterus (expulsion). This is more common in women who have recently had a child.   Pregnancy in the  fallopian tube (ectopic).  Pelvic inflammatory disease (PID), which is infection of the uterus and fallopian tubes. The risk of PID is slightly increased in the first 20 days after the IUD is placed, but the overall risk is still very low. BEFORE THE PROCEDURE  Schedule the IUD insertion for when you will have your menstrual period or right after, to make sure you are not pregnant. Placement of the IUD is better tolerated shortly after a menstrual cycle.  You may need to take tests or be examined to make sure you are not pregnant.  You may be required to take a pregnancy test.  You may be required to get checked for sexually transmitted infections (STIs) prior to placement. Placing an IUD in someone who has an infection can make the infection worse.  You may be given a pain reliever to take 1 or 2 hours before the procedure.  An exam will be performed to determine the size and position of your uterus.  Ask your health care provider about changing or stopping your regular medicines. PROCEDURE   A tool (speculum) is placed in the vagina. This allows your health care provider to see the lower part of the uterus (cervix).  The cervix is prepped with a medicine that lowers the risk of infection.  You may be given a medicine to numb each side of the cervix (intracervical or paracervical block). This is used to block and control any discomfort with insertion.  A tool (uterine sound) is inserted into the uterus to determine the length of the uterine cavity and the direction the  uterus may be tilted.  A slim instrument (IUD inserter) is inserted through the cervical canal and into your uterus.  The IUD is placed in the uterine cavity and the insertion device is removed.  The nylon string that is attached to the IUD and used for eventual IUD removal is trimmed. It is trimmed so that it lays high in the vagina, just outside the cervix. AFTER THE PROCEDURE  You may have bleeding after the  procedure. This is normal. It varies from light spotting for a few days to menstrual-like bleeding.  You may have mild cramping. Document Released: 10/03/2010 Document Revised: 11/25/2012 Document Reviewed: 07/26/2012 [facility] Patient Information 2014 [place].

## 2013-03-01 NOTE — Progress Notes (Signed)
CLINIC ENCOUNTER NOTE  History:  43 y.o. [id number] here today for follow up of pelvic pain and AUB. Has been seen multiple times in the ED, diagnosed with pelvic congestion, please see note below and previous MAU and ED notes.  Patient has been seen for AUB, desires Mirena IUD. Had benign endometrial biopsy on 11/18/2012.   The following portions of the patient's history were reviewed and updated as appropriate: allergies, current medications, past family history, past medical history, past social history, past surgical history and problem list. Normal pap smear in 08/28/11, normal mammogram in 06/25/11.    Review of Systems:  Pertinent items are noted in HPI.  Objective:  BP 130/81  Pulse 83  Temp(Src) 98.1 F (36.7 C) (Oral)  Ht 5' 5"  (1.651 m)  Wt 202 lb 12.8 oz (91.989 kg)  BMI 33.75 kg/m2  LMP 02/10/2013 Physical Exam deferred  Labs and Imaging 11/08/2012 TRANSABDOMINAL AND TRANSVAGINAL ULTRASOUND OF PELVIS CLINICAL DATA: Left lower abdominal pain. Previous tubal ligation. COMPARISON: 03/27/2012. FINDINGS: Uterus Measurements: 9.9 x 5.4 x 6.1 cm. There is a 7 x 10 mm hypoechoic intramural fibroid in the left posterolateral uterine body. Endometrium Thickness: 12 mm No focal abnormality visualized. Right ovary Measurements: 15 x 26 x 20 mm Normal appearance/no adnexal mass. Left ovary Measurements: 12 x 17 x 28 mm Normal appearance/no adnexal mass. Other findings There is a small amount of free pelvic fluid, which may be physiologic. IMPRESSION: Stable small uterine fibroid. No acute abnormality. Electronically Signed By: Arne Cleveland M.D. On: 11/08/2012 05:54   11/18/2012  Endometrium, biopsy - DEGENERATING SECRETORY-TYPE ENDOMETRIUM WITH TROMAL BREAKDOWN - NO EVIDENCE OF ATYPIA, DYSPLASIA OR MALIGNANCY  02/07/2013   CLINICAL DATA:  Left flank pain.  EXAM: CT ABDOMEN AND PELVIS WITH CONTRAST  TECHNIQUE: Multidetector CT imaging of the abdomen and pelvis was performed using the  standard protocol following bolus administration of intravenous contrast.  CONTRAST:  52m OMNIPAQUE IOHEXOL 300 MG/ML SOLN, 1080mOMNIPAQUE IOHEXOL 300 MG/ML SOLN  COMPARISON:  Ultrasound 01/16/2013  FINDINGS: Minimal dependent atelectasis in the visualized lung bases. Unremarkable liver, nondilated gallbladder, spleen, adrenal glands, kidneys, pancreas, aorta, portal vein. CONTRAST reflux down a dilated left gonadal vein. Stomach distended by ingested material. Small bowel and colon are nondilated. Normal appendix. Uterus is somewhat globular, mildly inhomogeneous enhancement. Adnexal regions unremarkable. Urinary bladder incompletely distended. Bilateral pelvic phleboliths. No free air. Trace pelvic ascites probably physiologic. No adenopathy. Normal bilateral renal excretion into decompressed proximal ureters. Regional bones unremarkable.  IMPRESSION: 1. No acute abdominal process. 2. Refluxing left ovarian vein. Correlate with any clinical evidence of pelvic congestion syndrome.   Electronically Signed   By: DaArne Cleveland.D.   On: 02/07/2013 19:37   Ir Radiologist Eval & Mgmt 02/25/2013   : February 24, 2013  JuRozelle LoganPAPend Oreille Surgery [facility] LLCoTric[facility]0AnnarHanover ParkNC  [id number]:  LaMeekah MathDOB: 1/16-Sep-1970 Dear JuAlmyra Free Thank you for referring Ms. LaNunzio Coryor evaluation of pelvic congestion syndrome and treatment options. The patient is a [age]ear old G5, P3P11frican American female with a primary complaint of bilateral lower pelvic pain which is worse on the left compared to the right. She complains of a constant aching and throbbing sensation in the pelvis which is worse with standing and has worsened significantly since approximately November. A CT of the abdomen and pelvis was performed on 02/07/13 [facility]  gonadal vein measuring up to 9 mm in diameter and enlarged into the  pelvis with some prominent veins present at the level of the left adnexa. The right gonadal vein is visualized but not significantly enlarged. The patient does have a history of a small uterine fibroid measuring 10 mm by previous ultrasound and heavy irregular periods. She has no history of anemia. She has had bilateral tubal ligation in 1996 but no other abdominal or pelvic surgery.  Past Medical History:  1. Cholelithiasis. The patient is scheduled to see Dr. Serita Grammes on 03/04/2013 for surgical opinion. She had an episode of right upper quadrant pain over Thanksgiving and also two months prior to that. Ultrasound has demonstrated multiple gallstones. She currently denies any right upper quadrant pain. 2. History of depression.  Exam: Vital Signs: Blood pressure 131/92, pulse 82, respirations 15, temperature 98.1, oxygen saturation 98 percent on room air. General: No acute distress. Chest: Clear to auscultation bilaterally. Heart: Regular rate and rhythm. Abdomen: Soft and nontender.  Labs: Sodium 137, potassium 3.6, BUN 11, creatinine 0.7, estimated GFR greater than 90 ml per minute, WBC 13, hemoglobin 13.3, hematocrit 38.8, platelet count 276. Endometrial biopsy on 11/18/12 showed no evidence of atypia, dysplasia or malignancy.  Imaging: I personally reviewed the prior CT of the abdomen and pelvis dated 02/07/2013 as well as prior pelvic ultrasound studies on 11/08/2012 and 03/27/2012.  Medications: Hydrocodone every 4 hours as needed, hydromorphone 2 mg every 6 hours as needed, ondansetron 8 mg every 8 hours as needed.  Allergies:  No known drug allergies.  Social History: The patient is single and lives in [place]. She is unemployed. She denies alcohol or tobacco use. She is on a high fiber diet for irritable bowel syndrome.  Family History: Aunt with diabetes. Multiple relatives with hypertension and renal disease.  Review of Systems: General: 5'5" in height. 200 pounds in weight. Chronic fatigue. Vision:  Some blurred vision. Gastrointestinal: Constipation, nausea, abdominal pain and occasional vomiting. History of irritable bowel syndrome. Genitourinary: No vaginal discharge or history of sexually transmitted diseases. No significant dyspareunia. No hematuria, dysuria or abnormal urinary frequency. Musculoskeletal: Chronic back pain, joint pain and muscle pain. Neurologic: Difficulty walking at times. Occasional headache. Respiratory: No shortness of breath or cough.  Assessment: I met with Ms Primo and reviewed imaging findings with her. 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 There do appear to be some mildly prominent pelvic veins present in the left pelvis by CT. The patient's symptoms are more left sided and there may be a contribution of venous insufficiency and pelvic venous congestion to the patient's pain. The uterus is not enlarged and there is no evidence of adnexal mass or cyst. Details of potential venography to confirm gonadal insufficiency and transcatheter embolization were discussed with the patient. If a venogram demonstrates a grossly insufficient left gonadal vein, transcatheter embolization may be of benefit in diminishing pain. The patient does feel that her pain has become worse over the last two months and is limiting her activity level. She has not been able to exercise. After discussion, the patient would like to pursue venography with possible embolization. We will begin the insurance approval process. In the meantime, she is also scheduled to see Dr. Donne Hazel soon for opinion regarding need for cholecystectomy. Thank you again for sending Ms. Postlewaite for consultation and allowing me to participate in her care. I will keep you updated with treatment plans.  Sincerely,  Aletta Edouard, M.D. Jonathon ResidesSerita Grammes, M.D. [facility] [place] Philippa Sicks, M.D.   Electronically Signed   By: Aletta Edouard M.D.   On:  02/24/2013 15:52    Assessment & Plan:  Re-discussed management options for abnormal uterine bleeding including oral progesterone, Depo Provera, Mirena IUD, endometrial ablation (Novasure/Hydrothermal Ablation) or hysterectomy as definitive surgical management.  Discussed risks and benefits of each method.  Patient still wants Mirena, she will return for placement. Meloxicam prescribed for pain Follow up with IR Routine preventative health maintenance measures emphasized.    Verita Schneiders, MD, [facility] [place], [place]

## 2013-03-04 ENCOUNTER — Encounter (INDEPENDENT_AMBULATORY_CARE_PROVIDER_SITE_OTHER): Payer: Self-pay | Admitting: General Surgery

## 2013-03-04 ENCOUNTER — Ambulatory Visit (INDEPENDENT_AMBULATORY_CARE_PROVIDER_SITE_OTHER): Payer: Medicaid Other | Admitting: General Surgery

## 2013-03-04 VITALS — BP 104/76 | HR 71 | Temp 98.0°F | Resp 16 | Ht 65.5 in | Wt 200.6 lb

## 2013-03-04 DIAGNOSIS — K8020 Calculus of gallbladder without cholecystitis without obstruction: Secondary | ICD-10-CM

## 2013-03-04 NOTE — Progress Notes (Signed)
Patient ID: Katelyn Brown, female   DOB: 1970-06-20, 43 y.o.   MRN: [id number]  Chief Complaint  Patient presents with  . Abdominal Pain    Evaluate gallbladder    HPI Katelyn Brown is a 43 y.o. female.  Referred by Dr Thomes Dinning HPI This is a 42 year old otherwise fairly healthy female who has a complaint of bilateral lower pelvic pain worse on the left than the right it goes down her leg. She also has had several episodes now of upper abdominal pain, right upper quadrant pain, radiating to the back for which she's been seen in the emergency room. She has undergone a CT scan which does show a concern of an enlarged left gonadal vein for which she's been evaluated at the [facility] clinic as well as by interventional radiology for treatment. She has also undergone an ultrasound that shows multiple gallstones and a wall echo shadow complex. She is referred to me for evaluation for gallbladder. She has had a number of episodes in the been associated with nausea and emesis as well as the pain. The pain occurs mostly on a daily basis at this point and is distinct from the lower abdominal pain that she has as well. Nothing is making this better at this point, eating does make it worse Past Medical History  Diagnosis Date  . IBS (irritable bowel syndrome)   . Carpal tunnel syndrome   . Allergy   . Neck pain     C5-C6 mild buldge  . STD (female)     History of GC/Chlamydia during preg  . Pyelonephritis 2000  . Ovarian cyst 2004  . Alopecia     f/u with Derm-- idiopathic scarring alopecia  . Hemorrhoid 2006    Colonscopy  . Uterine fibroid     Past Surgical History  Procedure Laterality Date  . Facial fracture surgery  1993  . Tubal ligation    . Eye surgery      Family History  Problem Relation Age of Onset  . Cancer Maternal Aunt   . Cancer Maternal Uncle     Social History History  Substance Use Topics  . Smoking status: Never Smoker   . Smokeless tobacco: Never Used  .  Alcohol Use: 0.0 oz/week     Comment: Occasional    No Known Allergies  Current Outpatient Prescriptions  Medication Sig Dispense Refill  . Acetaminophen (TYLENOL PO) Take 2 tablets by mouth 2 (two) times daily as needed (pain).      Marland Kitchen HYDROcodone-acetaminophen (NORCO/VICODIN) 5-325 MG per tablet Take 1 tablet by mouth every 4 (four) hours as needed for moderate pain.  15 tablet  0  . HYDROmorphone (DILAUDID) 2 MG tablet Take 1 tablet (2 mg total) by mouth every 6 (six) hours as needed for severe pain.  30 tablet  0  . meloxicam (MOBIC) 7.5 MG tablet Take 1 tablet (7.5 mg total) by mouth daily.  60 tablet  4   No current facility-administered medications for this visit.    Review of Systems Review of Systems  Constitutional: Negative for fever, chills and unexpected weight change.  HENT: Negative for congestion, hearing loss, sore throat, trouble swallowing and voice change.   Eyes: Negative for visual disturbance.  Respiratory: Negative for cough and wheezing.   Cardiovascular: Negative for chest pain, palpitations and leg swelling.  Gastrointestinal: Positive for abdominal pain. Negative for nausea, vomiting, diarrhea, constipation, blood in stool, abdominal distention and anal bleeding.  Genitourinary: Negative for hematuria, vaginal bleeding and difficulty  urinating.  Musculoskeletal: Negative for arthralgias.  Skin: Negative for rash and wound.  Neurological: Positive for weakness. Negative for seizures, syncope and headaches.  Hematological: Negative for adenopathy. Does not bruise/bleed easily.  Psychiatric/Behavioral: Negative for confusion.    Blood pressure 104/76, pulse 71, temperature 98 F (36.7 C), temperature source Temporal, resp. rate 16, height 5' 5.5" (1.664 m), weight 200 lb 9.6 oz (90.992 kg), last menstrual period 02/10/2013.  Physical Exam Physical Exam  Constitutional: She appears well-developed and well-nourished.  Eyes: No scleral icterus.  Neck: Neck  supple.  Cardiovascular: Normal rate, regular rhythm and normal heart sounds.   Pulmonary/Chest: Effort normal and breath sounds normal. She has no wheezes. She has no rales.  Abdominal: Soft. Bowel sounds are normal.  Lymphadenopathy:    She has no cervical adenopathy.    Data Reviewed ULTRASOUND ABDOMEN COMPLETE  COMPARISON: None.  FINDINGS:  Gallbladder:  Gallbladder contraction. The gallbladder is filled with multiple  stones representing wall echo shadow complex. Gallbladder wall is  not thickened and Murphy's sign is negative. Changes are  nonspecific.  Common bile duct:  Diameter: 2.5 mm, normal  Liver:  No focal lesion identified. Within normal limits in parenchymal  echogenicity.  IVC:  No abnormality visualized.  Pancreas:  Visualized portion unremarkable.  Spleen:  Size and appearance within normal limits.  Right Kidney:  Length: 11.5 cm. Echogenicity within normal limits. No mass or  hydronephrosis visualized.  Left Kidney:  Length: 11.1 cm. Echogenicity within normal limits. No mass or  hydronephrosis visualized.  Abdominal aorta:  No aneurysm visualized.  Other findings:  None.  IMPRESSION:  The gallbladder is contracted and diffusely filled with stones  creating wall echo shadow complex. No wall thickening and Murphy's  sign is negative.    Assessment    Symptomatic cholelithiasis    Plan    Laparoscopic cholecystectomy  I discussed the procedure in detail.  The patient was given educational material.  We discussed the risks and benefits of a laparoscopic cholecystectomy and possible cholangiogram including, but not limited to bleeding, infection, injury to surrounding structures such as the intestine or liver, bile leak, retained gallstones, need to convert to an open procedure, prolonged diarrhea, blood clots such as  DVT, common bile duct injury, anesthesia risks, and possible need for additional procedures.  The likelihood of improvement in  symptoms and return to the patient's normal status is good. We discussed the typical post-operative recovery course.         Katelyn Brown 03/04/2013, 3:53 PM    

## 2013-03-05 ENCOUNTER — Telehealth: Payer: Self-pay | Admitting: Emergency Medicine

## 2013-03-05 NOTE — Telephone Encounter (Signed)
CALLED PT TO HAVE HER CALL us AFTER SHE IS HEALED FROM HER LAP CHOLE.- WE WILL ALSO F/U ON HER TO CK STATUS OF HOW SHE IS DOING TO GET HER SCHEDULED AT [facility] W/ DR Kathlene Cote.

## 2013-03-08 ENCOUNTER — Encounter (HOSPITAL_COMMUNITY): Payer: Self-pay

## 2013-03-09 ENCOUNTER — Other Ambulatory Visit (INDEPENDENT_AMBULATORY_CARE_PROVIDER_SITE_OTHER): Payer: Self-pay | Admitting: General Surgery

## 2013-03-12 ENCOUNTER — Encounter (HOSPITAL_COMMUNITY): Payer: Self-pay

## 2013-03-12 ENCOUNTER — Encounter (HOSPITAL_COMMUNITY)
Admission: RE | Admit: 2013-03-12 | Discharge: 2013-03-12 | Disposition: A | Discharge status: Home / Self Care | Payer: Medicaid Other | Source: Ambulatory Visit | Attending: General Surgery | Admitting: General Surgery

## 2013-03-12 HISTORY — DX: Headache: R51

## 2013-03-12 HISTORY — DX: Anxiety disorder, unspecified: F41.9

## 2013-03-12 LAB — CBC WITH DIFFERENTIAL/PLATELET
Basophils Absolute: 0 10*3/uL (ref 0.0–0.1)
Basophils Relative: 0 % (ref 0–1)
EOS ABS: 0.1 10*3/uL (ref 0.0–0.7)
Eosinophils Relative: 1 % (ref 0–5)
HCT: 37.4 % (ref 36.0–46.0)
Hemoglobin: 12.9 g/dL (ref 12.0–15.0)
Lymphocytes Relative: 32 % (ref 12–46)
Lymphs Abs: 3.7 10*3/uL (ref 0.7–4.0)
MCH: 29.9 pg (ref 26.0–34.0)
MCHC: 34.5 g/dL (ref 30.0–36.0)
MCV: 86.8 fL (ref 78.0–100.0)
Monocytes Absolute: 0.9 10*3/uL (ref 0.1–1.0)
Monocytes Relative: 8 % (ref 3–12)
Neutro Abs: 6.9 10*3/uL (ref 1.7–7.7)
Neutrophils Relative %: 59 % (ref 43–77)
PLATELETS: 268 10*3/uL (ref 150–400)
RBC: 4.31 MIL/uL (ref 3.87–5.11)
RDW: 13.5 % (ref 11.5–15.5)
WBC: 11.7 10*3/uL — ABNORMAL HIGH (ref 4.0–10.5)

## 2013-03-12 LAB — COMPREHENSIVE METABOLIC PANEL
ALBUMIN: 3.8 g/dL (ref 3.5–5.2)
ALK PHOS: 46 U/L (ref 39–117)
ALT: 17 U/L (ref 0–35)
AST: 13 U/L (ref 0–37)
BUN: 10 mg/dL (ref 6–23)
CO2: 20 mEq/L (ref 19–32)
Calcium: 8.8 mg/dL (ref 8.4–10.5)
Chloride: 106 mEq/L (ref 96–112)
Creatinine, Ser: 0.62 mg/dL (ref 0.50–1.10)
GFR calc Af Amer: >90 mL/min (ref >90)
GFR calc non Af Amer: >90 mL/min (ref >90)
Glucose, Bld: 79 mg/dL (ref 70–99)
Potassium: 4.1 mEq/L (ref 3.7–5.3)
Sodium: 140 mEq/L (ref 137–147)
TOTAL PROTEIN: 7.4 g/dL (ref 6.0–8.3)
Total Bilirubin: 0.2 mg/dL — ABNORMAL LOW (ref 0.3–1.2)

## 2013-03-12 LAB — HCG, SERUM, QUALITATIVE: Preg, Serum: NEGATIVE

## 2013-03-12 NOTE — Pre-Procedure Instructions (Signed)
Luiza Carranco  03/12/2013   Your procedure is scheduled on:  Monday, Jan. 26th   Report to [facility]  2 * 3   ENTRANCE- A, go to ADMITTING at 5:30 AM.   Call this number if you have problems the morning of surgery: [phone]   Remember:   Do not eat food or drink liquids after midnight Sunday.   Take these medicines the morning of surgery with A SIP OF WATER: Norco or Dilaudid             STOP TAKING ANY ANTI-INFLAMMATORIES, HERBAL MEDS, BLOOD THINNERS 4-5 PRIOR TO SURGERY   Do not wear jewelry, make-up or nail polish.  Do not wear lotions, powders, or perfumes. You may wear deodorant.  Do not shave underarms & legs 48 hours prior to surgery.    Do not bring valuables to the hospital.  [facility] is not responsible for any belongings or valuables.               Contacts, dentures or bridgework may not be worn into surgery.  Leave suitcase in the car. After surgery it may be brought to your room.  For patients admitted to the hospital, discharge time is determined by your treatment team.               Patients discharged the day of surgery will not be allowed to drive home, and will need a responsible person to stay with you for 24hrs afterwards.   Name and phone number of your driver:    Special Instructions: Shower using CHG 2 nights before surgery and the night before surgery.  If you shower the day of surgery use CHG.  Use special wash - you have one bottle of CHG for all showers.  You should use approximately 1/3 of the bottle for each shower.   Please read over the following fact sheets that you were given: Pain Booklet, Coughing and Deep Breathing and Surgical Site Infection Prevention

## 2013-03-14 MED ORDER — CEFOXITIN SODIUM 2 G IV SOLR
2.0000 g | INTRAVENOUS | Status: AC
  Administered 2013-03-15: 2 g via INTRAVENOUS
  Filled 2013-03-14: qty 2

## 2013-03-15 ENCOUNTER — Encounter (HOSPITAL_COMMUNITY): Payer: Self-pay | Admitting: *Deleted

## 2013-03-15 ENCOUNTER — Ambulatory Visit (HOSPITAL_COMMUNITY): Payer: Medicaid Other | Admitting: Anesthesiology

## 2013-03-15 ENCOUNTER — Ambulatory Visit (HOSPITAL_COMMUNITY)
Admission: RE | Admit: 2013-03-15 | Discharge: 2013-03-15 | Disposition: A | Discharge status: Home / Self Care | Payer: Medicaid Other | Source: Ambulatory Visit | Attending: General Surgery | Admitting: General Surgery

## 2013-03-15 ENCOUNTER — Encounter (HOSPITAL_COMMUNITY)
Admission: RE | Disposition: A | Discharge status: Home / Self Care | Payer: Self-pay | Source: Ambulatory Visit | Attending: General Surgery

## 2013-03-15 ENCOUNTER — Encounter (HOSPITAL_COMMUNITY): Payer: Medicaid Other | Admitting: Anesthesiology

## 2013-03-15 DIAGNOSIS — K8010 Calculus of gallbladder with chronic cholecystitis without obstruction: Secondary | ICD-10-CM

## 2013-03-15 DIAGNOSIS — K589 Irritable bowel syndrome without diarrhea: Secondary | ICD-10-CM | POA: Insufficient documentation

## 2013-03-15 DIAGNOSIS — F3289 Other specified depressive episodes: Secondary | ICD-10-CM | POA: Insufficient documentation

## 2013-03-15 DIAGNOSIS — F329 Major depressive disorder, single episode, unspecified: Secondary | ICD-10-CM | POA: Insufficient documentation

## 2013-03-15 DIAGNOSIS — L658 Other specified nonscarring hair loss: Secondary | ICD-10-CM | POA: Insufficient documentation

## 2013-03-15 DIAGNOSIS — G5600 Carpal tunnel syndrome, unspecified upper limb: Secondary | ICD-10-CM | POA: Insufficient documentation

## 2013-03-15 DIAGNOSIS — G709 Myoneural disorder, unspecified: Secondary | ICD-10-CM | POA: Insufficient documentation

## 2013-03-15 DIAGNOSIS — F411 Generalized anxiety disorder: Secondary | ICD-10-CM | POA: Insufficient documentation

## 2013-03-15 HISTORY — PX: CHOLECYSTECTOMY: SHX55

## 2013-03-15 SURGERY — LAPAROSCOPIC CHOLECYSTECTOMY
Anesthesia: General | Site: Abdomen

## 2013-03-15 MED ORDER — HYDROMORPHONE HCL PF 1 MG/ML IJ SOLN
0.2500 mg | INTRAMUSCULAR | Status: DC | PRN
  Administered 2013-03-15 (×2): 0.5 mg via INTRAVENOUS

## 2013-03-15 MED ORDER — ONDANSETRON HCL 4 MG/2ML IJ SOLN
4.0000 mg | Freq: Once | INTRAMUSCULAR | Status: AC | PRN
  Administered 2013-03-15: 4 mg via INTRAVENOUS

## 2013-03-15 MED ORDER — SODIUM CHLORIDE 0.9 % IJ SOLN
INTRAMUSCULAR | Status: AC
  Filled 2013-03-15: qty 10

## 2013-03-15 MED ORDER — DEXAMETHASONE SODIUM PHOSPHATE 4 MG/ML IJ SOLN
INTRAMUSCULAR | Status: AC
  Filled 2013-03-15: qty 1

## 2013-03-15 MED ORDER — SUCCINYLCHOLINE CHLORIDE 20 MG/ML IJ SOLN
INTRAMUSCULAR | Status: DC | PRN
  Administered 2013-03-15: 100 mg via INTRAVENOUS

## 2013-03-15 MED ORDER — ROCURONIUM BROMIDE 100 MG/10ML IV SOLN
INTRAVENOUS | Status: DC | PRN
  Administered 2013-03-15: 30 mg via INTRAVENOUS
  Administered 2013-03-15: 10 mg via INTRAVENOUS

## 2013-03-15 MED ORDER — ROCURONIUM BROMIDE 50 MG/5ML IV SOLN
INTRAVENOUS | Status: AC
  Filled 2013-03-15: qty 1

## 2013-03-15 MED ORDER — HYDROCODONE-ACETAMINOPHEN 10-325 MG PO TABS: 1.0000 | ORAL_TABLET | Freq: Four times a day (QID) | ORAL | Status: DC | PRN

## 2013-03-15 MED ORDER — EPHEDRINE SULFATE 50 MG/ML IJ SOLN
INTRAMUSCULAR | Status: AC
  Filled 2013-03-15: qty 1

## 2013-03-15 MED ORDER — SODIUM CHLORIDE 0.9 % IJ SOLN: 3.0000 mL | Freq: Two times a day (BID) | INTRAMUSCULAR | Status: DC

## 2013-03-15 MED ORDER — NEOSTIGMINE METHYLSULFATE 1 MG/ML IJ SOLN
INTRAMUSCULAR | Status: DC | PRN
  Administered 2013-03-15: 3 mg via INTRAVENOUS

## 2013-03-15 MED ORDER — SODIUM CHLORIDE 0.9 % IR SOLN
Status: DC | PRN
  Administered 2013-03-15: 1000 mL

## 2013-03-15 MED ORDER — SUCCINYLCHOLINE CHLORIDE 20 MG/ML IJ SOLN
INTRAMUSCULAR | Status: AC
  Filled 2013-03-15: qty 1

## 2013-03-15 MED ORDER — NEOSTIGMINE METHYLSULFATE 1 MG/ML IJ SOLN
INTRAMUSCULAR | Status: AC
  Filled 2013-03-15: qty 10

## 2013-03-15 MED ORDER — ARTIFICIAL TEARS OP OINT
TOPICAL_OINTMENT | OPHTHALMIC | Status: AC
  Filled 2013-03-15: qty 3.5

## 2013-03-15 MED ORDER — HYDROMORPHONE HCL PF 1 MG/ML IJ SOLN
INTRAMUSCULAR | Status: AC
  Filled 2013-03-15: qty 1

## 2013-03-15 MED ORDER — ONDANSETRON HCL 4 MG/2ML IJ SOLN
INTRAMUSCULAR | Status: DC | PRN
  Administered 2013-03-15: 4 mg via INTRAVENOUS

## 2013-03-15 MED ORDER — ONDANSETRON HCL 4 MG/2ML IJ SOLN
INTRAMUSCULAR | Status: AC
  Filled 2013-03-15: qty 2

## 2013-03-15 MED ORDER — FENTANYL CITRATE 0.05 MG/ML IJ SOLN
INTRAMUSCULAR | Status: DC | PRN
  Administered 2013-03-15: 150 ug via INTRAVENOUS
  Administered 2013-03-15: 100 ug via INTRAVENOUS

## 2013-03-15 MED ORDER — PHENYLEPHRINE HCL 10 MG/ML IJ SOLN
INTRAMUSCULAR | Status: DC | PRN
  Administered 2013-03-15: 80 ug via INTRAVENOUS

## 2013-03-15 MED ORDER — DEXAMETHASONE SODIUM PHOSPHATE 4 MG/ML IJ SOLN
INTRAMUSCULAR | Status: DC | PRN
  Administered 2013-03-15: 4 mg via INTRAVENOUS

## 2013-03-15 MED ORDER — MORPHINE SULFATE 2 MG/ML IJ SOLN: 2.0000 mg | INTRAMUSCULAR | Status: DC | PRN

## 2013-03-15 MED ORDER — HYDROCODONE-ACETAMINOPHEN 5-325 MG PO TABS
1.0000 | ORAL_TABLET | ORAL | Status: DC | PRN
  Administered 2013-03-15: 2 via ORAL

## 2013-03-15 MED ORDER — LIDOCAINE HCL (CARDIAC) 20 MG/ML IV SOLN
INTRAVENOUS | Status: DC | PRN
  Administered 2013-03-15: 100 mg via INTRAVENOUS

## 2013-03-15 MED ORDER — BUPIVACAINE-EPINEPHRINE (PF) 0.25% -1:200000 IJ SOLN
INTRAMUSCULAR | Status: AC
  Filled 2013-03-15: qty 30

## 2013-03-15 MED ORDER — MIDAZOLAM HCL 2 MG/2ML IJ SOLN
INTRAMUSCULAR | Status: AC
  Filled 2013-03-15: qty 2

## 2013-03-15 MED ORDER — PHENYLEPHRINE 40 MCG/ML (10ML) SYRINGE FOR IV PUSH (FOR BLOOD PRESSURE SUPPORT)
PREFILLED_SYRINGE | INTRAVENOUS | Status: AC
  Filled 2013-03-15: qty 10

## 2013-03-15 MED ORDER — MIDAZOLAM HCL 5 MG/5ML IJ SOLN
INTRAMUSCULAR | Status: DC | PRN
  Administered 2013-03-15: 2 mg via INTRAVENOUS

## 2013-03-15 MED ORDER — SODIUM CHLORIDE 0.9 % IJ SOLN: 3.0000 mL | INTRAMUSCULAR | Status: DC | PRN

## 2013-03-15 MED ORDER — ONDANSETRON HCL 4 MG/2ML IJ SOLN: 4.0000 mg | Freq: Four times a day (QID) | INTRAMUSCULAR | Status: DC | PRN

## 2013-03-15 MED ORDER — GLYCOPYRROLATE 0.2 MG/ML IJ SOLN
INTRAMUSCULAR | Status: DC | PRN
  Administered 2013-03-15: .4 mg via INTRAVENOUS

## 2013-03-15 MED ORDER — SODIUM CHLORIDE 0.9 % IV SOLN: 250.0000 mL | INTRAVENOUS | Status: DC | PRN

## 2013-03-15 MED ORDER — HYDROCODONE-ACETAMINOPHEN 5-325 MG PO TABS
ORAL_TABLET | ORAL | Status: AC
  Filled 2013-03-15: qty 2

## 2013-03-15 MED ORDER — FENTANYL CITRATE 0.05 MG/ML IJ SOLN
INTRAMUSCULAR | Status: AC
  Filled 2013-03-15: qty 5

## 2013-03-15 MED ORDER — HEMOSTATIC AGENTS (NO CHARGE) OPTIME
TOPICAL | Status: DC | PRN
  Administered 2013-03-15: 1 via TOPICAL

## 2013-03-15 MED ORDER — ACETAMINOPHEN 325 MG PO TABS
650.0000 mg | ORAL_TABLET | ORAL | Status: DC | PRN
Start: 2013-03-15 — End: 2013-03-15

## 2013-03-15 MED ORDER — PROPOFOL 10 MG/ML IV BOLUS
INTRAVENOUS | Status: AC
  Filled 2013-03-15: qty 20

## 2013-03-15 MED ORDER — ACETAMINOPHEN 500 MG PO TABS: 1000.0000 mg | ORAL_TABLET | Freq: Four times a day (QID) | ORAL | Status: DC

## 2013-03-15 MED ORDER — PROPOFOL 10 MG/ML IV BOLUS
INTRAVENOUS | Status: DC | PRN
  Administered 2013-03-15: 200 mg via INTRAVENOUS

## 2013-03-15 MED ORDER — SODIUM CHLORIDE 0.9 % IV SOLN: INTRAVENOUS | Status: DC

## 2013-03-15 MED ORDER — GLYCOPYRROLATE 0.2 MG/ML IJ SOLN
INTRAMUSCULAR | Status: AC
  Filled 2013-03-15: qty 2

## 2013-03-15 MED ORDER — LACTATED RINGERS IV SOLN
INTRAVENOUS | Status: DC | PRN
  Administered 2013-03-15 (×2): via INTRAVENOUS

## 2013-03-15 MED ORDER — ACETAMINOPHEN 650 MG RE SUPP: 650.0000 mg | RECTAL | Status: DC | PRN

## 2013-03-15 MED ORDER — LIDOCAINE HCL (CARDIAC) 20 MG/ML IV SOLN
INTRAVENOUS | Status: AC
  Filled 2013-03-15: qty 5

## 2013-03-15 MED ORDER — BUPIVACAINE-EPINEPHRINE 0.25% -1:200000 IJ SOLN
INTRAMUSCULAR | Status: DC | PRN
  Administered 2013-03-15: 17 mL

## 2013-03-15 SURGICAL SUPPLY — 43 items
ADH SKN CLS APL DERMABOND .7 (GAUZE/BANDAGES/DRESSINGS) ×1
APPLIER CLIP 5 13 M/L LIGAMAX5 (MISCELLANEOUS) ×2
APR CLP MED LRG 5 ANG JAW (MISCELLANEOUS) ×1
BAG SPEC RTRVL 10 TROC 200 (ENDOMECHANICALS) ×1
BLADE SURG ROTATE 9660 (MISCELLANEOUS) IMPLANT
CANISTER SUCTION 2500CC (MISCELLANEOUS) ×2 IMPLANT
CHLORAPREP W/TINT 26ML (MISCELLANEOUS) ×2 IMPLANT
CLIP APPLIE 5 13 M/L LIGAMAX5 (MISCELLANEOUS) ×1 IMPLANT
COVER MAYO STAND STRL (DRAPES) IMPLANT
COVER SURGICAL LIGHT HANDLE (MISCELLANEOUS) ×2 IMPLANT
DECANTER SPIKE VIAL GLASS SM (MISCELLANEOUS) ×1 IMPLANT
DERMABOND ADVANCED (GAUZE/BANDAGES/DRESSINGS) ×1
DERMABOND ADVANCED .7 DNX12 (GAUZE/BANDAGES/DRESSINGS) ×1 IMPLANT
DRAPE C-ARM 42X72 X-RAY (DRAPES) IMPLANT
ELECT REM PT RETURN 9FT ADLT (ELECTROSURGICAL) ×2
ELECTRODE REM PT RTRN 9FT ADLT (ELECTROSURGICAL) ×1 IMPLANT
GLOVE BIO SURGEON STRL SZ7 (GLOVE) ×2 IMPLANT
GLOVE BIOGEL PI IND STRL 7.0 (GLOVE) IMPLANT
GLOVE BIOGEL PI IND STRL 7.5 (GLOVE) ×1 IMPLANT
GLOVE BIOGEL PI INDICATOR 7.0 (GLOVE) ×1
GLOVE BIOGEL PI INDICATOR 7.5 (GLOVE) ×1
GLOVE SURG SS PI 7.0 STRL IVOR (GLOVE) ×1 IMPLANT
GOWN PREVENTION PLUS XLARGE (GOWN DISPOSABLE) ×1 IMPLANT
GOWN STRL NON-REIN LRG LVL3 (GOWN DISPOSABLE) ×6 IMPLANT
HEMOSTAT SNOW SURGICEL 2X4 (HEMOSTASIS) ×1 IMPLANT
KIT BASIN OR (CUSTOM PROCEDURE TRAY) ×2 IMPLANT
KIT ROOM TURNOVER OR (KITS) ×2 IMPLANT
NS IRRIG 1000ML POUR BTL (IV SOLUTION) ×2 IMPLANT
PAD ARMBOARD 7.5X6 YLW CONV (MISCELLANEOUS) ×2 IMPLANT
POUCH RETRIEVAL ECOSAC 10 (ENDOMECHANICALS) ×1 IMPLANT
POUCH RETRIEVAL ECOSAC 10MM (ENDOMECHANICALS) ×1
SCISSORS LAP 5X35 DISP (ENDOMECHANICALS) ×2 IMPLANT
SET CHOLANGIOGRAPH 5 50 .035 (SET/KITS/TRAYS/PACK) IMPLANT
SET IRRIG TUBING LAPAROSCOPIC (IRRIGATION / IRRIGATOR) ×2 IMPLANT
SLEEVE ENDOPATH XCEL 5M (ENDOMECHANICALS) ×4 IMPLANT
SPECIMEN JAR SMALL (MISCELLANEOUS) ×2 IMPLANT
SUT MNCRL AB 4-0 PS2 18 (SUTURE) ×2 IMPLANT
SUT VICRYL 0 UR6 27IN ABS (SUTURE) ×2 IMPLANT
TOWEL OR 17X24 6PK STRL BLUE (TOWEL DISPOSABLE) ×2 IMPLANT
TOWEL OR 17X26 10 PK STRL BLUE (TOWEL DISPOSABLE) ×2 IMPLANT
TRAY LAPAROSCOPIC (CUSTOM PROCEDURE TRAY) ×2 IMPLANT
TROCAR XCEL BLUNT TIP 100MML (ENDOMECHANICALS) ×2 IMPLANT
TROCAR XCEL NON-BLD 5MMX100MML (ENDOMECHANICALS) ×2 IMPLANT

## 2013-03-15 NOTE — Transfer of Care (Signed)
Immediate Anesthesia Transfer of Care Note  Patient: Katelyn Brown  Procedure(s) Performed: Procedure(s): LAPAROSCOPIC CHOLECYSTECTOMY (N/A)  Patient Location: PACU  Anesthesia Type:General  Level of Consciousness: awake, alert  and oriented  Airway & Oxygen Therapy: Patient Spontanous Breathing and Patient connected to nasal cannula oxygen  Post-op Assessment: Report given to PACU RN and Post -op Vital signs reviewed and stable  Post vital signs: Reviewed and stable  Complications: No apparent anesthesia complications

## 2013-03-15 NOTE — Interval H&P Note (Signed)
History and Physical Interval Note:  03/15/2013 7:12 AM  Nunzio Cory  has presented today for surgery, with the diagnosis of cholelithiasis   The various methods of treatment have been discussed with the patient and family. After consideration of risks, benefits and other options for treatment, the patient has consented to  Procedure(s): LAPAROSCOPIC CHOLECYSTECTOMY (N/A) as a surgical intervention .  The patient's history has been reviewed, patient examined, no change in status, stable for surgery.  I have reviewed the patient's chart and labs.  Questions were answered to the patient's satisfaction.     Christine Morton

## 2013-03-15 NOTE — H&P (View-Only) (Signed)
Patient ID: Katelyn Brown, female   DOB: 1970-06-20, 43 y.o.   MRN: [id number]  Chief Complaint  Patient presents with  . Abdominal Pain    Evaluate gallbladder    HPI Katelyn Brown is a 43 y.o. female.  Referred by Dr Thomes Dinning HPI This is a 42 year old otherwise fairly healthy female who has a complaint of bilateral lower pelvic pain worse on the left than the right it goes down her leg. She also has had several episodes now of upper abdominal pain, right upper quadrant pain, radiating to the back for which she's been seen in the emergency room. She has undergone a CT scan which does show a concern of an enlarged left gonadal vein for which she's been evaluated at the [facility] clinic as well as by interventional radiology for treatment. She has also undergone an ultrasound that shows multiple gallstones and a wall echo shadow complex. She is referred to me for evaluation for gallbladder. She has had a number of episodes in the been associated with nausea and emesis as well as the pain. The pain occurs mostly on a daily basis at this point and is distinct from the lower abdominal pain that she has as well. Nothing is making this better at this point, eating does make it worse Past Medical History  Diagnosis Date  . IBS (irritable bowel syndrome)   . Carpal tunnel syndrome   . Allergy   . Neck pain     C5-C6 mild buldge  . STD (female)     History of GC/Chlamydia during preg  . Pyelonephritis 2000  . Ovarian cyst 2004  . Alopecia     f/u with Derm-- idiopathic scarring alopecia  . Hemorrhoid 2006    Colonscopy  . Uterine fibroid     Past Surgical History  Procedure Laterality Date  . Facial fracture surgery  1993  . Tubal ligation    . Eye surgery      Family History  Problem Relation Age of Onset  . Cancer Maternal Aunt   . Cancer Maternal Uncle     Social History History  Substance Use Topics  . Smoking status: Never Smoker   . Smokeless tobacco: Never Used  .  Alcohol Use: 0.0 oz/week     Comment: Occasional    No Known Allergies  Current Outpatient Prescriptions  Medication Sig Dispense Refill  . Acetaminophen (TYLENOL PO) Take 2 tablets by mouth 2 (two) times daily as needed (pain).      Marland Kitchen HYDROcodone-acetaminophen (NORCO/VICODIN) 5-325 MG per tablet Take 1 tablet by mouth every 4 (four) hours as needed for moderate pain.  15 tablet  0  . HYDROmorphone (DILAUDID) 2 MG tablet Take 1 tablet (2 mg total) by mouth every 6 (six) hours as needed for severe pain.  30 tablet  0  . meloxicam (MOBIC) 7.5 MG tablet Take 1 tablet (7.5 mg total) by mouth daily.  60 tablet  4   No current facility-administered medications for this visit.    Review of Systems Review of Systems  Constitutional: Negative for fever, chills and unexpected weight change.  HENT: Negative for congestion, hearing loss, sore throat, trouble swallowing and voice change.   Eyes: Negative for visual disturbance.  Respiratory: Negative for cough and wheezing.   Cardiovascular: Negative for chest pain, palpitations and leg swelling.  Gastrointestinal: Positive for abdominal pain. Negative for nausea, vomiting, diarrhea, constipation, blood in stool, abdominal distention and anal bleeding.  Genitourinary: Negative for hematuria, vaginal bleeding and difficulty  urinating.  Musculoskeletal: Negative for arthralgias.  Skin: Negative for rash and wound.  Neurological: Positive for weakness. Negative for seizures, syncope and headaches.  Hematological: Negative for adenopathy. Does not bruise/bleed easily.  Psychiatric/Behavioral: Negative for confusion.    Blood pressure 104/76, pulse 71, temperature 98 F (36.7 C), temperature source Temporal, resp. rate 16, height 5' 5.5" (1.664 m), weight 200 lb 9.6 oz (90.992 kg), last menstrual period 02/10/2013.  Physical Exam Physical Exam  Constitutional: She appears well-developed and well-nourished.  Eyes: No scleral icterus.  Neck: Neck  supple.  Cardiovascular: Normal rate, regular rhythm and normal heart sounds.   Pulmonary/Chest: Effort normal and breath sounds normal. She has no wheezes. She has no rales.  Abdominal: Soft. Bowel sounds are normal.  Lymphadenopathy:    She has no cervical adenopathy.    Data Reviewed ULTRASOUND ABDOMEN COMPLETE  COMPARISON: None.  FINDINGS:  Gallbladder:  Gallbladder contraction. The gallbladder is filled with multiple  stones representing wall echo shadow complex. Gallbladder wall is  not thickened and Murphy's sign is negative. Changes are  nonspecific.  Common bile duct:  Diameter: 2.5 mm, normal  Liver:  No focal lesion identified. Within normal limits in parenchymal  echogenicity.  IVC:  No abnormality visualized.  Pancreas:  Visualized portion unremarkable.  Spleen:  Size and appearance within normal limits.  Right Kidney:  Length: 11.5 cm. Echogenicity within normal limits. No mass or  hydronephrosis visualized.  Left Kidney:  Length: 11.1 cm. Echogenicity within normal limits. No mass or  hydronephrosis visualized.  Abdominal aorta:  No aneurysm visualized.  Other findings:  None.  IMPRESSION:  The gallbladder is contracted and diffusely filled with stones  creating wall echo shadow complex. No wall thickening and Murphy's  sign is negative.    Assessment    Symptomatic cholelithiasis    Plan    Laparoscopic cholecystectomy  I discussed the procedure in detail.  The patient was given Neurosurgeon.  We discussed the risks and benefits of a laparoscopic cholecystectomy and possible cholangiogram including, but not limited to bleeding, infection, injury to surrounding structures such as the intestine or liver, bile leak, retained gallstones, need to convert to an open procedure, prolonged diarrhea, blood clots such as  DVT, common bile duct injury, anesthesia risks, and possible need for additional procedures.  The likelihood of improvement in  symptoms and return to the patient's normal status is good. We discussed the typical post-operative recovery course.         Curtis Uriarte 03/04/2013, 3:53 PM

## 2013-03-15 NOTE — Anesthesia Preprocedure Evaluation (Addendum)
Anesthesia Evaluation  Patient identified by MRN, date of birth, ID band Patient awake    Reviewed: Allergy & Precautions, H&P , NPO status , Patient's Chart, lab work & pertinent test results  Airway Mallampati: II      Dental  (+) Teeth Intact   Pulmonary          Cardiovascular     Neuro/Psych  Headaches, Anxiety Depression  Neuromuscular disease    GI/Hepatic   Endo/Other    Renal/GU      Musculoskeletal   Abdominal   Peds  Hematology   Anesthesia Other Findings IBS  Reproductive/Obstetrics                          Anesthesia Physical Anesthesia Plan  ASA: II  Anesthesia Plan: General   Post-op Pain Management:    Induction: Intravenous  Airway Management Planned: Oral ETT  Additional Equipment:   Intra-op Plan:   Post-operative Plan: Extubation in OR  Informed Consent: I have reviewed the patients History and Physical, chart, labs and discussed the procedure including the risks, benefits and alternatives for the proposed anesthesia with the patient or authorized representative who has indicated his/her understanding and acceptance.     Plan Discussed with:   Anesthesia Plan Comments:        Anesthesia Quick Evaluation

## 2013-03-15 NOTE — Anesthesia Postprocedure Evaluation (Signed)
  Anesthesia Post-op Note  Patient: Katelyn Brown  Procedure(s) Performed: Procedure(s): LAPAROSCOPIC CHOLECYSTECTOMY (N/A)  Patient Location: PACU  Anesthesia Type:General  Level of Consciousness: awake, alert , oriented and patient cooperative  Airway and Oxygen Therapy: Patient Spontanous Breathing  Post-op Pain: mild  Post-op Assessment: Post-op Vital signs reviewed, Patient's Cardiovascular Status Stable, Respiratory Function Stable, Patent Airway, No signs of Nausea or vomiting and Pain level controlled  Post-op Vital Signs: stable  Complications: No apparent anesthesia complications

## 2013-03-15 NOTE — Discharge Instructions (Signed)
[facility] LAPAROSCOPIC SURGERY: POST OP INSTRUCTIONS  Always review your discharge instruction sheet given to you by the facility where your surgery was performed. IF YOU HAVE DISABILITY OR FAMILY LEAVE FORMS, YOU MUST BRING THEM TO THE OFFICE FOR PROCESSING.   DO NOT GIVE THEM TO YOUR DOCTOR.  1. A prescription for pain medication may be given to you upon discharge.  Take your pain medication as prescribed, if needed.  If narcotic pain medicine is not needed, then you may take acetaminophen (Tylenol), naprosyn (Alleve), or ibuprofen (Advil) as needed. 2. Take your usually prescribed medications unless otherwise directed. 3. If you need a refill on your pain medication, please contact your pharmacy.  They will contact our office to request authorization. Prescriptions will not be filled after 5pm or on week-ends. 4. You should follow a light diet the first few days after arrival home, such as soup and crackers, etc.  Be sure to include lots of fluids daily. 5. Most patients will experience some swelling and bruising in the area of the incisions.  Ice packs will help.  Swelling and bruising can take several days to resolve.  6. It is common to experience some constipation if taking pain medication after surgery.  Increasing fluid intake and taking a stool softener (such as Colace) will usually help or prevent this problem from occurring.  A mild laxative (Milk of Magnesia or Miralax) should be taken according to package instructions if there are no bowel movements after 48 hours. 7. Unless discharge instructions indicate otherwise, you may remove your bandages 48 hours after surgery, and you may shower at that time.  You may have steri-strips (small skin tapes) in place directly over the incision.  These strips should be left on the skin for 7-10 days.  If your surgeon used skin glue on the incision, you may shower in 24 hours.  The glue will flake  off over the next 2-3 weeks.  Any sutures or staples will be removed at the office during your follow-up visit. 8. ACTIVITIES:  You may resume regular (light) daily activities beginning the next day--such as daily self-care, walking, climbing stairs--gradually increasing activities as tolerated.  You may have sexual intercourse when it is comfortable.  Refrain from any heavy lifting or straining until approved by your doctor. a. You may drive when you are no longer taking prescription pain medication, you can comfortably wear a seatbelt, and you can safely maneuver your car and apply brakes. b. RETURN TO WORK:  __________________________________________________________ 9. You should see your doctor in the office for a follow-up appointment approximately 2-3 weeks after your surgery.  Make sure that you call for this appointment within a day or two after you arrive home to insure a convenient appointment time. 10. OTHER INSTRUCTIONS: __________________________________________________________________________________________________________________________ __________________________________________________________________________________________________________________________ WHEN TO CALL YOUR DOCTOR: 1. Fever over 101.0 2. Inability to urinate 3. Continued bleeding from incision. 4. Increased pain, redness, or drainage from the incision. 5. Increasing abdominal pain  The clinic staff is available to answer your questions during regular business hours.  Please don't hesitate to call and ask to speak to one of the nurses for clinical concerns.  If you have a medical emergency, go to the nearest emergency room or call 911.  A surgeon from [facility] is always on call at the hospital. [address], [address], [place], [place]  [address] ? [address] [address], [place], [place]   [address] [phone] ? [phone] ? [phone]   [phone] Web site: [email or web address]  

## 2013-03-15 NOTE — Op Note (Signed)
Preoperative diagnosis: Symptomatic cholelithiasis Postoperative diagnosis: Same as above Procedure: Laparoscopic cholecystectomy Surgeon: Dr. Serita Grammes Anesthesia: Gen. Estimated blood loss: Minimal Complications: None Drains: None Sponge and needle count was correct at completion Specimens gallbladder and contents to pathology Disposition to recovery room in stable condition  Indications: This is a 43 year old female who has upper abdominal pain that sounds like it is biliary in nature. She has an ultrasound that shows a wall echo shadow complex and her gallbladder appears to be full of stones. She and I discussed a laparoscopic cholecystectomy for symptomatic cholelithiasis.  Procedure: After informed consent was obtained the patient was taken to the operating room. She was given cefoxitin. Sequential compression devices were on her legs. She was then placed under general anesthesia without complication. Her abdomen was prepped and draped in the standard sterile surgical fashion. A surgical timeout was then performed.  I infiltrated Marcaine below the umbilicus. I made a vertical incision. I identified her fascia and incised it sharply. Her peritoneum was entered bluntly. There was no evidence of an entry injury. I then placed a 0 Vicryl pursestring suture through the fascia and inserted a Hassan trocar. The abdomen was insufflated to 15 mmHg pressure. She had some omentum that was adherent to her abdominal wall. I inserted a 5 mm trocar in the epigastrium as well as one in the right upper quadrant. I then viewed this area from above. There was no bowel and there was no evidence of an injury upon entry. I then lysed the adhesions from the abdominal wall. I did place a clip on one small vessel in the omentum while doing this. Once these adhesions were lysed I placed an additional right lateral trocar under direct vision. The gallbladder was then retracted cephalad and lateral. The gallbladder  was full of stones. I then obtained a critical view of safety. I then clipped the duct 3 times and divided leaving 3 clips in place. The duct was viable and the clips completely crossed the duct. I then treated the artery in a similar fashion leaving 2 clips in place. I then removed the gallbladder from the liver bed without difficulty. This was then placed in a bag and removed from the umbilicus. Hemostasis was then obtained. There was a little bit of oozing down in the triangle so I did place a piece of Surgicel snow. The irrigation was all evacuated. I then removed my umbilical trocar and tied the stitch down. I did place an additional 0 Vicryl figure-of-eight suture at this site. I then desufflated the abdomen and removed all my remaining trocars. These incisions were closed with 4-0 Monocryl. Dermabond was placed over them. She tolerated this well as extubated and transferred to recovery stable.

## 2013-03-15 NOTE — Preoperative (Signed)
Beta Blockers   Reason not to administer Beta Blockers:Not Applicable 

## 2013-03-16 ENCOUNTER — Encounter (HOSPITAL_COMMUNITY): Payer: Self-pay | Admitting: General Surgery

## 2013-03-17 ENCOUNTER — Telehealth (INDEPENDENT_AMBULATORY_CARE_PROVIDER_SITE_OTHER): Payer: Self-pay | Admitting: *Deleted

## 2013-03-17 NOTE — Telephone Encounter (Signed)
Patient called to report that she has been taking Colace but has not had a BM at this time.  Patient had surgery on 03/15/13, lap chole.  Advised patient per protocol to use Miralax.  Patient states she had already taken one dose yesterday but I advised her to go ahead and take another dose today.  Encouraged patient to give [place] a call back tomorrow at lunch time if she has still not had a BM.  Patient states understanding.  Patient also asked about a PO appt in 2-3 weeks however I don't see any available times in that time frame.  The first available is 2/24.  I explained that I would send a message to Dr. Donne Hazel and Lars Mage to find out if the 24th is ok or whether she needs to be seen earlier then we will give her a call back.  Patient states understanding and agreeable at this time.

## 2013-03-18 NOTE — Telephone Encounter (Signed)
Called pt to make her a f/u appt with Dr Donne Hazel for 2/11 arrive at 4:15/4:30.

## 2013-03-31 ENCOUNTER — Ambulatory Visit (INDEPENDENT_AMBULATORY_CARE_PROVIDER_SITE_OTHER): Payer: Medicaid Other | Admitting: General Surgery

## 2013-03-31 ENCOUNTER — Encounter (INDEPENDENT_AMBULATORY_CARE_PROVIDER_SITE_OTHER): Payer: Self-pay | Admitting: General Surgery

## 2013-03-31 VITALS — BP 112/70 | HR 68 | Resp 16 | Ht 65.5 in | Wt 200.0 lb

## 2013-03-31 DIAGNOSIS — Z09 Encounter for follow-up examination after completed treatment for conditions other than malignant neoplasm: Secondary | ICD-10-CM

## 2013-04-01 ENCOUNTER — Telehealth: Payer: Self-pay | Admitting: Emergency Medicine

## 2013-04-01 NOTE — Progress Notes (Signed)
Subjective:     Patient ID: Katelyn Brown, female   DOB: 1970/10/15, 43 y.o.   MRN: [id number]  HPI 43 year-old female status post laparoscopic cholecystectomy returns today doing well without any complications. Her pathology showed mild chronic cholecystitis and cholelithiasis. She is doing well, and she has no diarrhea.  Review of Systems     Objective:   Physical Exam Incisions are clean without any evidence of infection and healing well    Assessment:     Status post laparoscopic cholecystectomy     Plan:     She can return to normal activity. I will see her back as needed.

## 2013-04-01 NOTE — Telephone Encounter (Signed)
CALLED PT TO CK STATUS POST LAP CHOLE- SHE SAID SHE F/U W/ SURGEON YESTERDAY  AND EVERYTHING LOOKS GOOD. SHE IS INTERESTED IN SCHEDULING DR YAMAGATA'S PROCEDURE NEXT MONTH.   I WILL HAVE JENNIFER AT [facility] TO CALL HER WHEN SHE GETS THE SCHEDULE.

## 2013-04-01 NOTE — Telephone Encounter (Signed)
Message copied by Janith Lima on Thu Apr 01, 2013  3:57 PM ------      Message from: Janith Lima      Created: Tue Mar 23, 2013  4:11 PM      Regarding: FW: F/U POST SX- 03-15-13       F/U W/ DR ON 03-31-13- CK NOTE AND STATUS OF PT THEN.            ----- Message -----         From: Janith Lima, EMT         Sent: 03/05/2013  10:31 AM           To: Janith Lima, EMT, Henri Jamse Mead, RN      Subject: F/U POST SX- 03-15-13                                     CK STATUS OF PT POST LAP CHOLE- TO GET HER R/S AT [facility] W/ DR GY.       ------

## 2013-04-05 ENCOUNTER — Ambulatory Visit: Payer: Medicaid Other | Admitting: Obstetrics & Gynecology

## 2013-04-13 ENCOUNTER — Other Ambulatory Visit (HOSPITAL_COMMUNITY): Payer: Self-pay | Admitting: Interventional Radiology

## 2013-04-13 DIAGNOSIS — N9489 Other specified conditions associated with female genital organs and menstrual cycle: Secondary | ICD-10-CM

## 2013-04-16 ENCOUNTER — Encounter (HOSPITAL_COMMUNITY): Payer: Self-pay | Admitting: Pharmacy Technician

## 2013-04-21 ENCOUNTER — Other Ambulatory Visit: Payer: Self-pay | Admitting: Radiology

## 2013-04-23 ENCOUNTER — Encounter (HOSPITAL_COMMUNITY): Payer: Self-pay

## 2013-04-23 ENCOUNTER — Other Ambulatory Visit (HOSPITAL_COMMUNITY): Payer: Self-pay | Admitting: Interventional Radiology

## 2013-04-23 ENCOUNTER — Ambulatory Visit (HOSPITAL_COMMUNITY)
Admission: RE | Admit: 2013-04-23 | Discharge: 2013-04-23 | Disposition: A | Discharge status: Home / Self Care | Payer: Medicaid Other | Source: Ambulatory Visit | Attending: Interventional Radiology | Admitting: Interventional Radiology

## 2013-04-23 DIAGNOSIS — N9489 Other specified conditions associated with female genital organs and menstrual cycle: Secondary | ICD-10-CM

## 2013-04-23 DIAGNOSIS — I862 Pelvic varices: Secondary | ICD-10-CM | POA: Insufficient documentation

## 2013-04-23 DIAGNOSIS — G8929 Other chronic pain: Secondary | ICD-10-CM | POA: Insufficient documentation

## 2013-04-23 DIAGNOSIS — F411 Generalized anxiety disorder: Secondary | ICD-10-CM | POA: Insufficient documentation

## 2013-04-23 DIAGNOSIS — K589 Irritable bowel syndrome without diarrhea: Secondary | ICD-10-CM | POA: Insufficient documentation

## 2013-04-23 LAB — BASIC METABOLIC PANEL
BUN: 10 mg/dL (ref 6–23)
CHLORIDE: 106 meq/L (ref 96–112)
CO2: 23 meq/L (ref 19–32)
Calcium: 8.4 mg/dL (ref 8.4–10.5)
Creatinine, Ser: 0.68 mg/dL (ref 0.50–1.10)
GFR calc Af Amer: >90 mL/min (ref >90)
GLUCOSE: 87 mg/dL (ref 70–99)
POTASSIUM: 4.2 meq/L (ref 3.7–5.3)
Sodium: 143 mEq/L (ref 137–147)

## 2013-04-23 LAB — CBC WITH DIFFERENTIAL/PLATELET
BASOS PCT: 0 % (ref 0–1)
Basophils Absolute: 0.1 10*3/uL (ref 0.0–0.1)
EOS ABS: 0.1 10*3/uL (ref 0.0–0.7)
Eosinophils Relative: 1 % (ref 0–5)
HEMATOCRIT: 35.5 % — AB (ref 36.0–46.0)
HEMOGLOBIN: 12.3 g/dL (ref 12.0–15.0)
Lymphocytes Relative: 31 % (ref 12–46)
Lymphs Abs: 4.2 10*3/uL — ABNORMAL HIGH (ref 0.7–4.0)
MCH: 30.4 pg (ref 26.0–34.0)
MCHC: 34.6 g/dL (ref 30.0–36.0)
MCV: 87.9 fL (ref 78.0–100.0)
MONO ABS: 0.9 10*3/uL (ref 0.1–1.0)
MONOS PCT: 7 % (ref 3–12)
Neutro Abs: 8.4 10*3/uL — ABNORMAL HIGH (ref 1.7–7.7)
Neutrophils Relative %: 61 % (ref 43–77)
Platelets: 313 10*3/uL (ref 150–400)
RBC: 4.04 MIL/uL (ref 3.87–5.11)
RDW: 13.8 % (ref 11.5–15.5)
WBC: 13.7 10*3/uL — ABNORMAL HIGH (ref 4.0–10.5)

## 2013-04-23 LAB — APTT: aPTT: 29 seconds (ref 24–37)

## 2013-04-23 LAB — PROTIME-INR
INR: 1.09 (ref 0.00–1.49)
Prothrombin Time: 13.9 seconds (ref 11.6–15.2)

## 2013-04-23 MED ORDER — IOHEXOL 300 MG/ML  SOLN
100.0000 mL | Freq: Once | INTRAMUSCULAR | Status: AC | PRN
  Administered 2013-04-23: 60 mL via INTRA_ARTERIAL

## 2013-04-23 MED ORDER — MIDAZOLAM HCL 2 MG/2ML IJ SOLN
INTRAMUSCULAR | Status: AC | PRN
  Administered 2013-04-23 (×4): 1 mg via INTRAVENOUS
  Administered 2013-04-23: 2 mg via INTRAVENOUS
  Administered 2013-04-23: 1 mg via INTRAVENOUS

## 2013-04-23 MED ORDER — SODIUM CHLORIDE 0.9 % IV SOLN
INTRAVENOUS | Status: DC
  Administered 2013-04-23: 07:00:00 via INTRAVENOUS

## 2013-04-23 MED ORDER — CEFAZOLIN SODIUM-DEXTROSE 2-3 GM-% IV SOLR
2.0000 g | Freq: Once | INTRAVENOUS | Status: AC
  Administered 2013-04-23: 2 g via INTRAVENOUS
  Filled 2013-04-23: qty 50

## 2013-04-23 MED ORDER — ACETAMINOPHEN 325 MG PO TABS
650.0000 mg | ORAL_TABLET | Freq: Four times a day (QID) | ORAL | Status: DC | PRN
  Administered 2013-04-23: 650 mg via ORAL
  Filled 2013-04-23: qty 2

## 2013-04-23 MED ORDER — FENTANYL CITRATE 0.05 MG/ML IJ SOLN
INTRAMUSCULAR | Status: AC
  Filled 2013-04-23: qty 4

## 2013-04-23 MED ORDER — MIDAZOLAM HCL 2 MG/2ML IJ SOLN
INTRAMUSCULAR | Status: AC
Start: 2013-04-23 — End: 2013-04-23
  Filled 2013-04-23: qty 10

## 2013-04-23 MED ORDER — FENTANYL CITRATE 0.05 MG/ML IJ SOLN
INTRAMUSCULAR | Status: AC | PRN
  Administered 2013-04-23 (×2): 50 ug via INTRAVENOUS
  Administered 2013-04-23 (×2): 25 ug via INTRAVENOUS
  Administered 2013-04-23: 50 ug via INTRAVENOUS

## 2013-04-23 MED ORDER — NITROGLYCERIN 1 MG/10 ML FOR IR/CATH LAB
INTRA_ARTERIAL | Status: AC
  Administered 2013-04-23: 200 ug
  Filled 2013-04-23: qty 10

## 2013-04-23 NOTE — Progress Notes (Signed)
Pt was complaining of headache it was 5 out of 10.  MD was paged, orders were given for 650 mg of tylenol.  Tylenol was given.  PT is resting in her room.  Will continue to monitor closely
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
## 2013-04-23 NOTE — H&P (Signed)
Agree.  Will proceed with ovarian venography with possible embolization today.

## 2013-04-23 NOTE — Progress Notes (Signed)
pts states that her headache has been relieved some.  She is ready to go home.

## 2013-04-23 NOTE — Progress Notes (Signed)
PTs foley was removed at 1345.  1800 cc of yellow was emptied.  Pt tolerated procedure well.  Will continue to monitor closely

## 2013-04-23 NOTE — Progress Notes (Signed)
Foley catheter placed per MD order and per protocol.  Pt tolerated procedure well.  Yellow urine was in the line.  Will continue to monitor closely

## 2013-04-23 NOTE — Procedures (Signed)
Procedure:  Left renal and ovarian venography with coil embolization of left ovarian vein Access:  Right common femoral vein, 6Fr sheath Findings:  Incompetent left ovarian vein demonstrating prominent reflux and supplying network of pelvic varicosities. Embolization performed of left ovarian vein with total of 20 coils ranging from 3 mm to 10 mm in diameter.  Good occlusion achieved.

## 2013-04-23 NOTE — Discharge Instructions (Signed)
Angiography, Care After ° °Refer to this sheet in the next few weeks. These instructions provide you with information on caring for yourself after your procedure. Your health care provider may also give you more specific instructions. Your treatment has been planned according to current medical practices, but problems sometimes occur. Call your health care provider if you have any problems or questions after your procedure.  °WHAT TO EXPECT AFTER THE PROCEDURE °After your procedure, it is typical to have the following sensations: °· Minor discomfort or tenderness and a small bump at the catheter insertion site. The bump should usually decrease in size and tenderness within 1 to 2 weeks. °· Any bruising will usually fade within 2 to 4 weeks. °HOME CARE INSTRUCTIONS  °· You may need to keep taking blood thinners if they were prescribed for you. Only take over-the-counter or prescription medicines for pain, fever, or discomfort as directed by your health care provider. °· Do not apply powder or lotion to the site. °· Do not sit in a bathtub, swimming pool, or whirlpool for 5 to 7 days. °· You may shower 24 hours after the procedure. Remove the bandage (dressing) and gently wash the site with plain soap and water. Gently pat the site dry. °· Inspect the site at least twice daily. °· Limit your activity for the first 24 hours. Do not bend, squat, or lift anything over 10 lb (9 kg) or as directed by your health care provider. °· Do not drive home if you are discharged the day of the procedure. Have someone else drive you. Follow instructions about when you can drive or return to work. °SEEK MEDICAL CARE IF: °· You get lightheaded when standing up. °· You have drainage (other than a small amount of blood on the dressing). °· You have chills. °· You have a fever. °· You have redness, warmth, swelling, or pain at the insertion site. °SEEK IMMEDIATE MEDICAL CARE IF:  °· You develop chest pain or shortness of breath, feel  faint, or pass out. °· You have bleeding, swelling larger than a walnut, or drainage from the catheter insertion site. °· You develop pain, discoloration, coldness, or severe bruising in the leg or arm that held the catheter. °· You have heavy bleeding from the site. If this happens, hold pressure on the site. °MAKE SURE YOU: °· Understand these instructions. °· Will watch your condition. °· Will get help right away if you are not doing well or get worse. °Document Released: 08/23/2004 Document Revised: 10/07/2012 Document Reviewed: 06/29/2012 °Exit[employer]® Patient Information ©2014 [employer]. ° °

## 2013-04-27 ENCOUNTER — Other Ambulatory Visit (HOSPITAL_COMMUNITY): Payer: Self-pay | Admitting: Interventional Radiology

## 2013-04-27 ENCOUNTER — Telehealth: Payer: Self-pay | Admitting: Emergency Medicine

## 2013-04-27 ENCOUNTER — Other Ambulatory Visit: Payer: Self-pay | Admitting: Emergency Medicine

## 2013-04-27 DIAGNOSIS — R102 Pelvic and perineal pain: Secondary | ICD-10-CM

## 2013-04-27 DIAGNOSIS — N9489 Other specified conditions associated with female genital organs and menstrual cycle: Secondary | ICD-10-CM

## 2013-04-27 NOTE — Telephone Encounter (Signed)
CALLED PT ABOUT 1530PM TO SET UP F/U APPT W/ DR GY- PT HAD C/C OF BAD PELVIC PRESSURE AND PAIN W/ URINATION SINCE SHE HAS BEEN D/C.  ALSO RT LEG PAIN AT INCISION SITE.  SHE DID HAVE 101 FEVER OVER THE WEEKEND AND HAS DECREASED TO 99.  S/W DR Kathlene Cote- HAVE PT TO BEGIN 400-600MG  IBU Q6HRS W/ FOOD, TAKE TEMP, ALSO HAVE HER SEE IR IN CLINIC TOMORROW AND HAVE A URINE AND U/A CULTURE DONE PRIOR TO APPT.  CALLED PT BACK  WITH ABOVE INFO. SHE WILL GO TO [facility] FOR U/A BETWEEN 8-12, AND SEE DR WATTS AT 400/345PM ON 04-28-13.

## 2013-04-28 ENCOUNTER — Other Ambulatory Visit (HOSPITAL_COMMUNITY): Payer: Self-pay | Admitting: Interventional Radiology

## 2013-04-28 ENCOUNTER — Ambulatory Visit
Admission: RE | Admit: 2013-04-28 | Discharge: 2013-04-28 | Disposition: A | Discharge status: Home / Self Care | Payer: Medicaid Other | Source: Ambulatory Visit | Attending: Interventional Radiology | Admitting: Interventional Radiology

## 2013-04-28 DIAGNOSIS — R102 Pelvic and perineal pain: Secondary | ICD-10-CM

## 2013-04-28 DIAGNOSIS — N9489 Other specified conditions associated with female genital organs and menstrual cycle: Secondary | ICD-10-CM

## 2013-04-28 LAB — URINALYSIS
BILIRUBIN URINE: NEGATIVE
GLUCOSE, UA: NEGATIVE mg/dL
Hgb urine dipstick: NEGATIVE
Ketones, ur: NEGATIVE mg/dL
LEUKOCYTES UA: NEGATIVE
Nitrite: NEGATIVE
Protein, ur: NEGATIVE mg/dL
Specific Gravity, Urine: 1.025 (ref 1.005–1.030)
Urobilinogen, UA: 0.2 mg/dL (ref 0.0–1.0)
pH: 6.5 (ref 5.0–8.0)

## 2013-04-28 NOTE — Progress Notes (Signed)
PT HAS A NUMB/PAINFUL/ACHEY FEELING IN HER RT GROIN REGION.  SHE HAD A TEMP OF 101 OVER THE WEEKEND AND NOW HAS COME DOWN TO 98.  EXPERIENCING BAD PRESSURE AND CRAMPING W/ URINATION.  A LITTLE SORT WINDED FEELING.  SHE DID TAKE 600MG  IBU ONCE LAST NIGHT AND IT DID HELP.  SHE DOESN'T LIKE TAKING MEDS.

## 2013-04-30 LAB — URINE CULTURE
Colony Count: NO GROWTH
Organism ID, Bacteria: NO GROWTH

## 2013-05-25 ENCOUNTER — Ambulatory Visit
Admission: RE | Admit: 2013-05-25 | Discharge: 2013-05-25 | Disposition: A | Discharge status: Home / Self Care | Payer: Medicaid Other | Source: Ambulatory Visit | Attending: Interventional Radiology | Admitting: Interventional Radiology

## 2013-05-25 DIAGNOSIS — R102 Pelvic and perineal pain unspecified side: Secondary | ICD-10-CM

## 2013-05-25 DIAGNOSIS — N9489 Other specified conditions associated with female genital organs and menstrual cycle: Secondary | ICD-10-CM

## 2013-06-23 ENCOUNTER — Other Ambulatory Visit (HOSPITAL_COMMUNITY): Payer: Self-pay | Admitting: Interventional Radiology

## 2013-06-23 DIAGNOSIS — M7989 Other specified soft tissue disorders: Secondary | ICD-10-CM

## 2013-06-23 DIAGNOSIS — N9489 Other specified conditions associated with female genital organs and menstrual cycle: Secondary | ICD-10-CM

## 2013-06-24 ENCOUNTER — Encounter (INDEPENDENT_AMBULATORY_CARE_PROVIDER_SITE_OTHER): Payer: Self-pay

## 2013-06-24 ENCOUNTER — Ambulatory Visit
Admission: RE | Admit: 2013-06-24 | Discharge: 2013-06-24 | Disposition: A | Discharge status: Home / Self Care | Payer: Medicaid Other | Source: Ambulatory Visit | Attending: Interventional Radiology | Admitting: Interventional Radiology

## 2013-06-24 ENCOUNTER — Other Ambulatory Visit (HOSPITAL_COMMUNITY): Payer: Self-pay | Admitting: Interventional Radiology

## 2013-06-24 DIAGNOSIS — N9489 Other specified conditions associated with female genital organs and menstrual cycle: Secondary | ICD-10-CM

## 2013-06-24 DIAGNOSIS — M7989 Other specified soft tissue disorders: Secondary | ICD-10-CM

## 2013-07-01 ENCOUNTER — Ambulatory Visit
Admission: RE | Admit: 2013-07-01 | Discharge: 2013-07-01 | Disposition: A | Discharge status: Home / Self Care | Payer: Medicaid Other | Source: Ambulatory Visit | Attending: Interventional Radiology | Admitting: Interventional Radiology

## 2013-07-01 DIAGNOSIS — N9489 Other specified conditions associated with female genital organs and menstrual cycle: Secondary | ICD-10-CM

## 2013-07-01 DIAGNOSIS — M7989 Other specified soft tissue disorders: Secondary | ICD-10-CM

## 2013-08-09 ENCOUNTER — Ambulatory Visit (INDEPENDENT_AMBULATORY_CARE_PROVIDER_SITE_OTHER): Payer: Medicaid Other | Admitting: Family Medicine

## 2013-08-09 VITALS — BP 127/84 | HR 88 | Temp 98.2°F | Ht 65.5 in | Wt 194.0 lb

## 2013-08-09 DIAGNOSIS — F339 Major depressive disorder, recurrent, unspecified: Secondary | ICD-10-CM

## 2013-08-09 MED ORDER — CITALOPRAM HYDROBROMIDE 20 MG PO TABS: 20.0000 mg | ORAL_TABLET | Freq: Every day | ORAL | Status: DC

## 2013-08-09 NOTE — Patient Instructions (Signed)
It has been a pleasure to see you today. Please take the medications as prescribed. Make your next appointment in 2 weeks.

## 2013-08-11 NOTE — Progress Notes (Signed)
Family Medicine Office Visit Note   Subjective:   Patient ID: Katelyn Brown, female  DOB: May 03, 1970, 43 y.o.. MRN: [id number]   Pt that comes today to discuss about her "nervs" she has PMX of remote depression but has been feeling well and denies recent use of antidepressive medication. She got the news her dog has cancer and she is unable to provide for chemotherapy for it and she is very upset about this. She also has gone through several medical procedures this year (cholecystectomy and Embolization of left ovarian vein) she reports this has increased her level of stress and feels with lack of energy. Has difficulty sleeping and anhedonia. Denies suicidal ideation or plan.   Review of Systems:  Pt denies SOB, chest pain, palpitations, headaches, dizziness, numbness or weakness. No changes on urinary or BM habits.   Objective:   Physical Exam: Gen:  NAD HEENT: Moist mucous membranes  CV: Regular rate and rhythm, no murmurs rubs or gallops PULM: Clear to auscultation bilaterally. No wheezes/rales/rhonchi ABD: Soft, non tender, non distended, normal bowel sounds EXT: No edema Neuro: Alert and oriented x3. No focalization. Psych: Depressed mood, flat affect. Normal speech. Normal though process. No hallucinations/ delusions. No agitation.  Assessment & Plan:

## 2013-08-12 NOTE — Assessment & Plan Note (Signed)
Wellbutrin did not help last time and pt discontinue treatment. Discussed Celexa and she is agreeable to try. Close f/u in 2 weeks.

## 2013-09-08 ENCOUNTER — Other Ambulatory Visit (HOSPITAL_COMMUNITY)
Admission: RE | Admit: 2013-09-08 | Discharge: 2013-09-08 | Disposition: A | Discharge status: Home / Self Care | Payer: Medicaid Other | Source: Ambulatory Visit | Attending: Family Medicine | Admitting: Family Medicine

## 2013-09-08 ENCOUNTER — Ambulatory Visit (INDEPENDENT_AMBULATORY_CARE_PROVIDER_SITE_OTHER): Payer: Medicaid Other | Admitting: Family Medicine

## 2013-09-08 DIAGNOSIS — R350 Frequency of micturition: Secondary | ICD-10-CM

## 2013-09-08 DIAGNOSIS — N898 Other specified noninflammatory disorders of vagina: Secondary | ICD-10-CM

## 2013-09-08 DIAGNOSIS — Z113 Encounter for screening for infections with a predominantly sexual mode of transmission: Secondary | ICD-10-CM | POA: Insufficient documentation

## 2013-09-08 LAB — POCT WET PREP (WET MOUNT): Clue Cells Wet Prep Whiff POC: POSITIVE

## 2013-09-08 LAB — POCT URINALYSIS DIPSTICK
Bilirubin, UA: NEGATIVE
Blood, UA: NEGATIVE
GLUCOSE UA: NEGATIVE
KETONES UA: NEGATIVE
Leukocytes, UA: NEGATIVE
Nitrite, UA: NEGATIVE
Protein, UA: NEGATIVE
UROBILINOGEN UA: 0.2
pH, UA: 6

## 2013-09-08 MED ORDER — METRONIDAZOLE 500 MG PO TABS: 500.0000 mg | ORAL_TABLET | Freq: Two times a day (BID) | ORAL | Status: DC

## 2013-09-08 NOTE — Progress Notes (Signed)
   Subjective:    Patient ID: Katelyn Brown, female    DOB: 1971/02/12, 44 y.o.   MRN: [id number]  HPI 43 year old female presents for evaluation of vaginal discharge.  1) Vaginal discharge Onset: 2-3 days  Description:  White, thin  Odor: No odor Itching: Yes. Irritation as well.   Symptoms Dysuria: no; But urinary frequency. Bleeding: no  Pelvic pain: no  Fever: no  Genital sores: no   Red Flags: Missed period: no  Pregnancy: no  Recent antibiotics: no  Sexual activity: yes  Possible STD exposure: Yes. Unprotected sex ~5 days ago (unprotected). IUD: no   Review of Systems Per HPI    Objective:   Physical Exam General: well appearing female in NAD.  Pelvic Exam:        External: normal female genitalia without lesions or masses        Vagina: white discharge noted.        Cervix: normal without lesions or masses        Samples for Wet prep, GC/Chlamydia obtained   Assessment & Plan:  Vaginal discharge - Wet prep obtained today. Findings consistent with BV.  Will treat with Flagyl x 7 days. - Patient with recent unprotected intercourse.  She desired STI testing today which was performed (GC/Chlamydia).  Will call with results.   Urinary frequency - Unclear etiology. - Negative UA. Could be secondary to vaginal discharge and subsequent irritation. - Will continue to monitor.

## 2013-09-10 ENCOUNTER — Telehealth: Payer: Self-pay | Admitting: *Deleted

## 2013-09-10 NOTE — Telephone Encounter (Signed)
Message copied by Corinna Capra on Fri Sep 10, 2013  3:10 PM ------      Message from: Coral Spikes      Created: Fri Sep 10, 2013 12:00 PM       Please inform patient of negative GC/Chlamydia.            Thanks            Jayce ------

## 2013-09-10 NOTE — Telephone Encounter (Signed)
Relayed message,patient voiced understanding. [place] S  

## 2013-10-05 ENCOUNTER — Encounter: Payer: Self-pay | Admitting: Family Medicine

## 2013-10-05 ENCOUNTER — Ambulatory Visit (INDEPENDENT_AMBULATORY_CARE_PROVIDER_SITE_OTHER): Payer: Medicaid Other | Admitting: Family Medicine

## 2013-10-05 VITALS — BP 130/94 | HR 77 | Temp 98.9°F | Wt 202.0 lb

## 2013-10-05 DIAGNOSIS — J029 Acute pharyngitis, unspecified: Secondary | ICD-10-CM

## 2013-10-05 LAB — POCT RAPID STREP A (OFFICE): RAPID STREP A SCREEN: NEGATIVE

## 2013-10-05 MED ORDER — FLUTICASONE PROPIONATE 50 MCG/ACT NA SUSP: 2.0000 | Freq: Every day | NASAL | Status: DC

## 2013-10-05 MED ORDER — AMOXICILLIN 500 MG PO CAPS: 500.0000 mg | ORAL_CAPSULE | Freq: Two times a day (BID) | ORAL | Status: DC

## 2013-10-05 NOTE — Patient Instructions (Signed)
Thank you for coming in,   I have prescribed amoxicillin for your sore throat. Flonase will help with the inflammation in your nose.    Please feel free to call with any questions or concerns at any time, at [phone]. --Dr. Raeford Razor  Strep Throat Strep throat is an infection of the throat caused by a bacteria named Streptococcus pyogenes. Your health care provider may call the infection streptococcal "tonsillitis" or "pharyngitis" depending on whether there are signs of inflammation in the tonsils or back of the throat. Strep throat is most common in children aged 5-15 years during the cold months of the year, but it can occur in people of any age during any season. This infection is spread from person to person (contagious) through coughing, sneezing, or other close contact. SIGNS AND SYMPTOMS   Fever or chills.  Painful, swollen, red tonsils or throat.  Pain or difficulty when swallowing.  White or yellow spots on the tonsils or throat.  Swollen, tender lymph nodes or "glands" of the neck or under the jaw.  Red rash all over the body (rare). DIAGNOSIS  Many different infections can cause the same symptoms. A test must be done to confirm the diagnosis so the right treatment can be given. A "rapid strep test" can help your health care provider make the diagnosis in a few minutes. If this test is not available, a light swab of the infected area can be used for a throat culture test. If a throat culture test is done, results are usually available in a day or two. TREATMENT  Strep throat is treated with antibiotic medicine. HOME CARE INSTRUCTIONS   Gargle with 1 tsp of salt in 1 cup of warm water, 3-4 times per day or as needed for comfort.  Family members who also have a sore throat or fever should be tested for strep throat and treated with antibiotics if they have the strep infection.  Make sure everyone in your household washes their hands well.  Do not share food, drinking cups, or  personal items that could cause the infection to spread to others.  You may need to eat a soft food diet until your sore throat gets better.  Drink enough water and fluids to keep your urine clear or pale yellow. This will help prevent dehydration.  Get plenty of rest.  Stay home from school, day care, or work until you have been on antibiotics for 24 hours.  Take medicines only as directed by your health care provider.  Take your antibiotic medicine as directed by your health care provider. Finish it even if you start to feel better. SEEK MEDICAL CARE IF:   The glands in your neck continue to enlarge.  You develop a rash, cough, or earache.  You cough up green, yellow-brown, or bloody sputum.  You have pain or discomfort not controlled by medicines.  Your problems seem to be getting worse rather than better.  You have a fever. SEEK IMMEDIATE MEDICAL CARE IF:   You develop any new symptoms such as vomiting, severe headache, stiff or painful neck, chest pain, shortness of breath, or trouble swallowing.  You develop severe throat pain, drooling, or changes in your voice.  You develop swelling of the neck, or the skin on the neck becomes red and tender.  You develop signs of dehydration, such as fatigue, dry mouth, and decreased urination.  You become increasingly sleepy, or you cannot wake up completely. MAKE SURE YOU:  Understand these instructions.  Will  watch your condition.  Will get help right away if you are not doing well or get worse. Document Released: 02/02/2000 Document Revised: 06/21/2013 Document Reviewed: 04/05/2010 [facility] Patient Information 2015 [place], [place]. This information is not intended to replace advice given to you by your health care provider. Make sure you discuss any questions you have with your health care provider.

## 2013-10-06 DIAGNOSIS — J029 Acute pharyngitis, unspecified: Secondary | ICD-10-CM | POA: Insufficient documentation

## 2013-10-06 NOTE — Assessment & Plan Note (Signed)
Most likely pharyngitis. Tonsillar exudates present.  - amoxicillin 500 mg BID for 10 days.

## 2013-10-06 NOTE — Progress Notes (Signed)
   Subjective:    Patient ID: Katelyn Brown, female    DOB: 1970-04-09, 43 y.o.   MRN: [id number]  HPI Katelyn Brown is here in same day clinic for sore throat.   Symptoms started about a week ago. She has a history of tonsillitis. She denies any fevers or chills. Pain in throat is burning in nature. She has been eating cool things to help with the pain.  There is no dysphagia. She denies any sick contacts. She's had itchy/watery eyes and sneezing. She denies rhinorrhea. She denies any vomiting but has had nausea. She has been taking tylenol and aleve for pain.   Current Outpatient Prescriptions on File Prior to Visit  Medication Sig Dispense Refill  . acetaminophen (TYLENOL) 325 MG tablet Take 325 mg by mouth every 6 (six) hours as needed for mild pain, moderate pain or headache.      . Multiple Vitamins-Minerals (WOMENS HAIR, SKIN & NAILS PO) Take 2 tablets by mouth daily. chewable      . Naphazoline-Pheniramine (OPCON-A) 0.027-0.315 % SOLN Place 1 drop into both eyes daily.      . citalopram (CELEXA) 20 MG tablet Take 1 tablet (20 mg total) by mouth daily.  30 tablet  3   No current facility-administered medications on file prior to visit.   Review of Systems See HPI     Objective:   Physical Exam BP 130/94  Pulse 77  Temp(Src) 98.9 F (37.2 C) (Oral)  Wt 202 lb (91.627 kg) Gen: NAD, alert, cooperative with exam, well-appearing HEENT: NCAT, EOMI, tonsillar exudates, TM's intact and clear, no anterior or cervical LAD, clear conjunctiva,          Assessment & Plan:

## 2013-11-18 ENCOUNTER — Telehealth: Payer: Self-pay | Admitting: *Deleted

## 2013-11-18 DIAGNOSIS — R42 Dizziness and giddiness: Secondary | ICD-10-CM

## 2013-11-18 MED ORDER — MECLIZINE HCL 32 MG PO TABS: 32.0000 mg | ORAL_TABLET | Freq: Three times a day (TID) | ORAL | Status: DC | PRN

## 2013-11-18 NOTE — Telephone Encounter (Signed)
Spoke with patient and informed her of below 

## 2013-11-18 NOTE — Telephone Encounter (Signed)
Returned pt's call regarding vertigo.  Pt requested medication to be called into her pharmacy.  Pt stated she is unable to make an appt because she is can't up or stand.  Pt is unsure of the medication she has taken in the past.  Will forward to PCP for further advise.  Derl Barrow, RN

## 2013-11-18 NOTE — Telephone Encounter (Signed)
Sent in prescription for meclizine to her pharmacy. Please ask her to follow up within the next few days. Thanks!

## 2013-11-18 NOTE — Telephone Encounter (Signed)
Pt called asking for refill on medication for vertigo. Says she has been to the hospital several times for it, wasn't sure if one of our doctors have seen her for this problem but stated if they had, it has been a long time. Advised pt we would need to see her. She says she is not able to get up to even go to the ED. Asking to speak with RN.

## 2013-11-19 MED ORDER — MECLIZINE HCL 25 MG PO TABS: 25.0000 mg | ORAL_TABLET | Freq: Three times a day (TID) | ORAL | Status: DC | PRN

## 2013-11-19 NOTE — Telephone Encounter (Signed)
Received fax from [facility] stating that meclizine does not come in 32 mg tablet only 12.5 mg or 25 mg tablet.  Please send in new Rx to [facility].  Derl Barrow, RN

## 2013-11-19 NOTE — Telephone Encounter (Signed)
Prescription for Meclizine 25mg  TID sent in.

## 2013-11-19 NOTE — Addendum Note (Signed)
Addended by: Lorna Few on: 11/19/2013 11:53 AM   Modules accepted: Orders

## 2013-12-20 ENCOUNTER — Encounter: Payer: Self-pay | Admitting: Family Medicine

## 2013-12-21 ENCOUNTER — Other Ambulatory Visit: Payer: Self-pay | Admitting: Family Medicine

## 2013-12-21 DIAGNOSIS — Z1231 Encounter for screening mammogram for malignant neoplasm of breast: Secondary | ICD-10-CM

## 2013-12-27 ENCOUNTER — Ambulatory Visit (HOSPITAL_COMMUNITY)
Admission: RE | Admit: 2013-12-27 | Discharge: 2013-12-27 | Disposition: A | Discharge status: Home / Self Care | Payer: Medicaid Other | Source: Ambulatory Visit | Attending: Internal Medicine | Admitting: Internal Medicine

## 2013-12-27 DIAGNOSIS — Z1231 Encounter for screening mammogram for malignant neoplasm of breast: Secondary | ICD-10-CM | POA: Diagnosis present

## 2014-02-01 ENCOUNTER — Encounter: Payer: Self-pay | Admitting: *Deleted

## 2014-03-04 ENCOUNTER — Ambulatory Visit (INDEPENDENT_AMBULATORY_CARE_PROVIDER_SITE_OTHER): Payer: Medicaid Other | Admitting: Family Medicine

## 2014-03-04 ENCOUNTER — Other Ambulatory Visit (HOSPITAL_COMMUNITY)
Admission: RE | Admit: 2014-03-04 | Discharge: 2014-03-04 | Disposition: A | Discharge status: Home / Self Care | Payer: Medicaid Other | Source: Ambulatory Visit | Attending: Family Medicine | Admitting: Family Medicine

## 2014-03-04 ENCOUNTER — Telehealth: Payer: Self-pay | Admitting: Family Medicine

## 2014-03-04 VITALS — BP 128/99 | HR 93 | Temp 98.0°F | Wt 208.0 lb

## 2014-03-04 DIAGNOSIS — Z20828 Contact with and (suspected) exposure to other viral communicable diseases: Secondary | ICD-10-CM

## 2014-03-04 DIAGNOSIS — Z202 Contact with and (suspected) exposure to infections with a predominantly sexual mode of transmission: Secondary | ICD-10-CM

## 2014-03-04 DIAGNOSIS — R300 Dysuria: Secondary | ICD-10-CM

## 2014-03-04 DIAGNOSIS — Z113 Encounter for screening for infections with a predominantly sexual mode of transmission: Secondary | ICD-10-CM | POA: Insufficient documentation

## 2014-03-04 DIAGNOSIS — N760 Acute vaginitis: Secondary | ICD-10-CM

## 2014-03-04 DIAGNOSIS — R399 Unspecified symptoms and signs involving the genitourinary system: Secondary | ICD-10-CM

## 2014-03-04 LAB — POCT URINALYSIS DIPSTICK
Bilirubin, UA: NEGATIVE
GLUCOSE UA: NEGATIVE
KETONES UA: NEGATIVE
Nitrite, UA: NEGATIVE
Protein, UA: NEGATIVE
Spec Grav, UA: 1.025
UROBILINOGEN UA: 0.2
pH, UA: 6.5

## 2014-03-04 LAB — POCT WET PREP (WET MOUNT): Clue Cells Wet Prep Whiff POC: POSITIVE

## 2014-03-04 LAB — POCT UA - MICROSCOPIC ONLY

## 2014-03-04 MED ORDER — NITROFURANTOIN MONOHYD MACRO 100 MG PO CAPS: 100.0000 mg | ORAL_CAPSULE | Freq: Two times a day (BID) | ORAL | Status: DC

## 2014-03-04 MED ORDER — FLUCONAZOLE 150 MG PO TABS: 150.0000 mg | ORAL_TABLET | Freq: Once | ORAL | Status: DC

## 2014-03-04 NOTE — Progress Notes (Signed)
Patient ID: Katelyn Brown, female   DOB: [birth year], 44 y.o.   MRN: [id number] Subjective:   CC: Dysuria  HPI:   DYSURIA  Pain urinating started 2 days ago. Pain is: suprapubic/lower abdomen Medications tried: none Any antibiotics in the last 30 days: none More than 3 UTIs in the last 12 months: no STD exposure: yes Possibly pregnant: h/o BTL  Symptoms Urgency: yes Frequency: yes Blood in urine: yes Pain in back: chronic LBP, some increased Fever: no Vaginal discharge: mild Mouth Ulcers: no  H/o STD with chlamydia  Review of Symptoms - see HPI PMH - Smoking status noted.    Review of Systems - Per HPI.   PMH - Reviewed SH: Sexually active with female partner    Objective:  Physical Exam BP 128/99 mmHg  Pulse 93  Temp(Src) 98 F (36.7 C) (Oral)  Wt 208 lb (94.348 kg)  LMP 02/18/2014 (Approximate) GEN: NAD GU: Thin white discharge; normal appearing vagina Cervix very posterior Cervical irregularities, ?nabothian cysts with 1/2 cm tissue palpated on posterior surface and anterior irregularities as well No CMT Abd s/nt/nd, uterus and adnexae with no abnormal palpations    Assessment:     Katelyn Brown is a 44 y.o. female here for dysuria.    Plan:     # See problem list and after visit summary for problem-specific plans.   # Health Maintenance: Return for well woman visit with Dr Gerlean Ren July or sooner if preferred.  Follow-up: Follow up in 1-2 weeks if no better.   Hilton Sinclair, MD [place]

## 2014-03-04 NOTE — Patient Instructions (Signed)
Good to see you.  I will call if any labs are NOT normal. I will go ahead and send in macrobid for you to take for your urinary symptoms. If you do not feel better, come back in 1-2 weeks. If you feel significantly worse, have fevers, or other concerns, seek immediate care.  For your prior surgeries, you have seen Dr Donne Hazel (general surgery) and Dr Hulan Fray and Dr Harolyn Rutherford (Ob-Gyn).  Hilton Sinclair, MD

## 2014-03-04 NOTE — Telephone Encounter (Addendum)
Was unable to reach Katelyn Brown and has asked her to call back 1/15. She has not called yet. Please call her to let her know wet prep showed bacterial vaginosis (not an STD. A common infection) that is easily treatable and is the cause for her discharge. If it is not bothering her, we can hold off on treating as the body usually clears this infection; but if it IS bothering her, I can send in another antibiotic. Wet prep did NOT show yeast or trichomonas. GC/chlamydia, HIV, and RPR were negative.  I have also sent in diflucan to the pharmacy IN CASE antibiotics cause her a yeast infection. She does not need to pick this up unless she needs it.  Hilton Sinclair, MD

## 2014-03-04 NOTE — Assessment & Plan Note (Signed)
Dysuria with some pelvic pain/ dyspareunia and mild discharge. Pt amenable to STD testing. No systemic symptoms / fever. - UA>>10-15 epithelial cells so not as useful; symptoms very consistent with UTI. Will treat with macrobid x 7 days. - Wet prep, GC/Chlamydia, HIV/RPR. Pt wants labs mailed to her if negative. Call if positive. - Pelvic with irregularity of cervix. Possibly nabothian cyst. Pap smears 2012 and 2013 normal. Would be due for repeat this July.   - ADDENDUM - Wet prep with BV. Called and left VM for pt to determine treatment. Also sent in fluconazole in case of yeast infection. - F/u 1-2 weeks if no better or sooner if worse.

## 2014-03-05 LAB — HIV ANTIBODY (ROUTINE TESTING W REFLEX): HIV: NONREACTIVE

## 2014-03-05 LAB — RPR

## 2014-03-07 LAB — GC/CHLAMYDIA PROBE AMP (~~LOC~~) NOT AT ARMC
CHLAMYDIA, DNA PROBE: NEGATIVE
NEISSERIA GONORRHEA: NEGATIVE

## 2014-03-09 NOTE — Telephone Encounter (Signed)
Pt is aware of results and has already picked up medication. Fielding Mault,CMA

## 2014-03-16 ENCOUNTER — Telehealth: Payer: Self-pay | Admitting: Family Medicine

## 2014-03-16 NOTE — Telephone Encounter (Signed)
Called and left message. When patient calls back, please let her know that I discussed the cervical irregularity we found when she came in 1/15 and Dr Nori Riis agreed that it is a good idea for her to come back into clinic to try and visualize her cervix (this was difficult last visit as it was very posterior). It will involve another speculum exam. If there is any irregularity on exam, we can discuss the next step. There is nothing to worry about at this time - just would be helpful to do this to be on the safe side and not miss any cervical issue. Since her pap smears have been normal (2012 and 2013), risk for cervical cancer is low.  Thanks.  Hilton Sinclair, MD

## 2014-03-17 NOTE — Telephone Encounter (Signed)
Would you rather the patient be seen on your schedule or in colpo clinic?  Jazmin Hartsell,CMA

## 2014-03-17 NOTE — Telephone Encounter (Signed)
Have her see me, her PCP, or another available provider (whatever is most convenient) and if anything abnormal is found she will need follow up with Colpo clinic.  Hilton Sinclair, MD

## 2014-03-18 NOTE — Telephone Encounter (Signed)
LMOVM for pt to return call .Krystale Rinkenberger Dawn  

## 2014-03-23 NOTE — Telephone Encounter (Signed)
Pt is aware of this.  appt made for 04/06/14. Hakim Minniefield,CMA

## 2014-04-06 ENCOUNTER — Telehealth: Payer: Self-pay | Admitting: Family Medicine

## 2014-04-06 ENCOUNTER — Other Ambulatory Visit (HOSPITAL_COMMUNITY)
Admission: RE | Admit: 2014-04-06 | Discharge: 2014-04-06 | Disposition: A | Discharge status: Home / Self Care | Payer: Medicaid Other | Source: Ambulatory Visit | Attending: Family Medicine | Admitting: Family Medicine

## 2014-04-06 ENCOUNTER — Ambulatory Visit (INDEPENDENT_AMBULATORY_CARE_PROVIDER_SITE_OTHER): Payer: Medicaid Other | Admitting: Family Medicine

## 2014-04-06 ENCOUNTER — Encounter: Payer: Self-pay | Admitting: Family Medicine

## 2014-04-06 VITALS — BP 129/91 | HR 89 | Temp 98.8°F | Ht 66.0 in | Wt 208.0 lb

## 2014-04-06 DIAGNOSIS — Z124 Encounter for screening for malignant neoplasm of cervix: Secondary | ICD-10-CM

## 2014-04-06 DIAGNOSIS — R102 Pelvic and perineal pain unspecified side: Secondary | ICD-10-CM

## 2014-04-06 DIAGNOSIS — R5383 Other fatigue: Secondary | ICD-10-CM

## 2014-04-06 DIAGNOSIS — N889 Noninflammatory disorder of cervix uteri, unspecified: Secondary | ICD-10-CM

## 2014-04-06 DIAGNOSIS — Z01419 Encounter for gynecological examination (general) (routine) without abnormal findings: Secondary | ICD-10-CM | POA: Insufficient documentation

## 2014-04-06 DIAGNOSIS — N898 Other specified noninflammatory disorders of vagina: Secondary | ICD-10-CM

## 2014-04-06 DIAGNOSIS — Z791 Long term (current) use of non-steroidal anti-inflammatories (NSAID): Secondary | ICD-10-CM

## 2014-04-06 DIAGNOSIS — Z1151 Encounter for screening for human papillomavirus (HPV): Secondary | ICD-10-CM | POA: Diagnosis present

## 2014-04-06 DIAGNOSIS — R5382 Chronic fatigue, unspecified: Secondary | ICD-10-CM

## 2014-04-06 LAB — COMPREHENSIVE METABOLIC PANEL
ALBUMIN: 4.1 g/dL (ref 3.5–5.2)
ALK PHOS: 40 U/L (ref 39–117)
ALT: 16 U/L (ref 0–35)
AST: 13 U/L (ref 0–37)
BUN: 10 mg/dL (ref 6–23)
CALCIUM: 8.6 mg/dL (ref 8.4–10.5)
CHLORIDE: 107 meq/L (ref 96–112)
CO2: 23 mEq/L (ref 19–32)
Creat: 0.63 mg/dL (ref 0.50–1.10)
Glucose, Bld: 96 mg/dL (ref 70–99)
POTASSIUM: 3.8 meq/L (ref 3.5–5.3)
SODIUM: 139 meq/L (ref 135–145)
TOTAL PROTEIN: 6.7 g/dL (ref 6.0–8.3)
Total Bilirubin: 0.4 mg/dL (ref 0.2–1.2)

## 2014-04-06 LAB — POCT WET PREP (WET MOUNT): CLUE CELLS WET PREP WHIFF POC: POSITIVE

## 2014-04-06 LAB — CBC
HCT: 37.5 % (ref 36.0–46.0)
Hemoglobin: 12.4 g/dL (ref 12.0–15.0)
MCH: 29 pg (ref 26.0–34.0)
MCHC: 33.1 g/dL (ref 30.0–36.0)
MCV: 87.6 fL (ref 78.0–100.0)
MPV: 9.8 fL (ref 8.6–12.4)
Platelets: 301 10*3/uL (ref 150–400)
RBC: 4.28 MIL/uL (ref 3.87–5.11)
RDW: 14.7 % (ref 11.5–15.5)
WBC: 10.3 10*3/uL (ref 4.0–10.5)

## 2014-04-06 LAB — TSH: TSH: 1.211 u[IU]/mL (ref 0.350–4.500)

## 2014-04-06 NOTE — Patient Instructions (Addendum)
Good to see you today.  We examined your cervix again today. It still feels like it has scar tissue though it looks normal on exam. - Make an appointment with colposcopy clinic for further evaluation. - We are ordering a transvaginal ultrasound given your pelvic pain and tenderness on exam. - F/u with [place] (me or Dr Gerlean Ren) for your pelvic pain if it does not improve in 2-4 weeks. At that time we can consider having you seen again at [facility] by the gynecologists. - We are getting labwork today and I will call if NOT normal. - you can continue ibuprofen or aleve (do not take both) every 1-2 days with plenty of water. We just need to monitor your kidneys.  Best,  Hilton Sinclair, MD

## 2014-04-06 NOTE — Progress Notes (Signed)
Patient ID: Katelyn Brown, female   DOB: 03/01/1970, 44 y.o.   MRN: [id number] Subjective:   CC: Follow up irregular cervix  HPI:   F/u irregular cervix Last visit 1/15, cervix very posterior and irregular with 1/2 cm tissue palpated posterior and anterior surfaces Paps all normal but none with cotesting. Pt continues to have some pelvic and vaginal discomfort (see below).  Pelvic pain Mild. Continued urinary frequency with no dysuria. Mild discharge that has improved since taking flagyl last visit in Jan. Vagina feels irritated. Takes aleve/ibuprofen q other day for pelvic discomfort which helps. Some pelvic fullness but no feeling of bulge in vagina. No fevers or chills. No report of nausea, vomiting, or diarrhea. She has had 3 children vaginally. States symptoms present since pelvic congestion surgery Mar 2015.  Fatigue Did not have much time to discuss, except that patient stated she has had fever ever since her pelvic congestion surgery Mar 2015. She was diagnosed with anemia due to heavy vaginal bleeding which she still has, and takes iron whenever on her period but it constipates her.  DUB with endometrial bx 11/2012 (benign). Pt states she declined tx including IUD at that time or endometrial ablation or hysterectomy.  Review of Systems - Per HPI.   PMH: Endometrial bx 11/2013 for DUB - benign Pap smear 07/2002, 11/2005 (absent transformation zone), 11/2007, 12/2008, 05/2010 and 08/2011 - all negative, none with cotesting.    Objective:  Physical Exam BP 129/91 mmHg  Pulse 89  Temp(Src) 98.8 F (37.1 C) (Oral)  Ht 5\' 6"  (1.676 m)  Wt 208 lb (94.348 kg)  BMI 33.59 kg/m2  LMP 03/14/2014 (Approximate) GEN: NAD ABD: S/mildly tender suprapubically/ND GU: Cervix normal on appearance with mild friability at center; Still irregular to palpation Tenderness posterior to cervix against uterine wall with mild firmness Thin white discharge  Assessment:     Katelyn Brown is a  44 y.o. female here for f/u of irregular cervix    Plan:     # See problem list and after visit summary for problem-specific plans.   Follow-up: Follow up in 2-4 weeks if pelvic pain not improving.   Hilton Sinclair, MD [place]

## 2014-04-06 NOTE — Telephone Encounter (Signed)
Called from [facility] hospital for authorization for PT's ultrasound / sr

## 2014-04-07 ENCOUNTER — Other Ambulatory Visit: Payer: Self-pay | Admitting: Family Medicine

## 2014-04-07 ENCOUNTER — Ambulatory Visit (HOSPITAL_COMMUNITY)
Admission: RE | Admit: 2014-04-07 | Discharge: 2014-04-07 | Disposition: A | Discharge status: Home / Self Care | Payer: Medicaid Other | Source: Ambulatory Visit | Attending: Family Medicine | Admitting: Family Medicine

## 2014-04-07 DIAGNOSIS — R102 Pelvic and perineal pain: Secondary | ICD-10-CM

## 2014-04-07 DIAGNOSIS — N9489 Other specified conditions associated with female genital organs and menstrual cycle: Secondary | ICD-10-CM | POA: Diagnosis not present

## 2014-04-07 DIAGNOSIS — D259 Leiomyoma of uterus, unspecified: Secondary | ICD-10-CM | POA: Insufficient documentation

## 2014-04-07 DIAGNOSIS — N898 Other specified noninflammatory disorders of vagina: Secondary | ICD-10-CM | POA: Diagnosis present

## 2014-04-07 DIAGNOSIS — Z9049 Acquired absence of other specified parts of digestive tract: Secondary | ICD-10-CM | POA: Diagnosis not present

## 2014-04-07 DIAGNOSIS — N949 Unspecified condition associated with female genital organs and menstrual cycle: Secondary | ICD-10-CM | POA: Diagnosis not present

## 2014-04-07 DIAGNOSIS — N8320 Unspecified ovarian cysts: Secondary | ICD-10-CM | POA: Insufficient documentation

## 2014-04-07 NOTE — Telephone Encounter (Signed)
[id number]

## 2014-04-08 ENCOUNTER — Telehealth: Payer: Self-pay | Admitting: Family Medicine

## 2014-04-08 DIAGNOSIS — N889 Noninflammatory disorder of cervix uteri, unspecified: Secondary | ICD-10-CM | POA: Insufficient documentation

## 2014-04-08 DIAGNOSIS — N898 Other specified noninflammatory disorders of vagina: Secondary | ICD-10-CM

## 2014-04-08 DIAGNOSIS — R5383 Other fatigue: Secondary | ICD-10-CM | POA: Insufficient documentation

## 2014-04-08 DIAGNOSIS — R102 Pelvic and perineal pain: Secondary | ICD-10-CM | POA: Insufficient documentation

## 2014-04-08 HISTORY — DX: Noninflammatory disorder of cervix uteri, unspecified: N88.9

## 2014-04-08 LAB — CYTOLOGY - PAP

## 2014-04-08 MED ORDER — METRONIDAZOLE 500 MG PO TABS: 500.0000 mg | ORAL_TABLET | Freq: Two times a day (BID) | ORAL | Status: AC

## 2014-04-08 NOTE — Assessment & Plan Note (Signed)
Did not fully evaluate fatigue but with DUB hx, concern for anemia. Pt infrequently takes iron and CBC not checked recently.  - CBC, TSH, CMET. - Encouraged to take iron daily with stool softener/miralax. - F/u to fully evaluate with PCP.

## 2014-04-08 NOTE — Telephone Encounter (Signed)
Called to let patient know labwork (CBC, CMET, TSH, pap smear with HPV testing) were normal and TVUS showed probable small L uterine fibroid (0.9cm), endometrial stripe normal thickness, nearly anechoic right ovarian cyst (3.7cm) that was "almost certainly benign" and for which "no specific imaging follow up is recommended" per [place] read, and normal-appearing L ovary. On review, 10/2012 TVUS showed this fibroid but no ovarian cyst; 03/2012 US showed the fibroid, left ~3cm ovarian cyst, but no right ovarian cyst. Also discussed that wet prep was positive for BV. Pt states she frequently wears no underwear, just spanx, and douches once monthly, washing vagina daily with just water. - Discussed that pelvic discomfort could certainly be from small fibroid or 3.7cm R ovary cyst (per [place], did not require f/u imaging) and that if she continued having discomfort, I would recommend f/u with [facility]. - Send in flagyl for BV. Discussed wearing cotton underwear, stopping douching, and using unscented sensitive soap to clean outside of vagina daily. Discussed avoidance of alcohol while taking flagyl. - Given negative pap smear, will discuss with Dr Nori Riis if colposcopy for irregular feeling cervix is necessary and call patient back.  Hilton Sinclair, MD

## 2014-04-08 NOTE — Assessment & Plan Note (Addendum)
Mild. H/o Pelvic congestion surgery and ?colpo - possible pain from scar tissue vs bladder prolapse. No obvious prolapse on exam.GC/Chlamydia neg last visit. Possibly related to pt's IBS.  - Wet prep given some discharge. - TVUS - f/u [place] if no resolution - NSAID q 1-2 d prn. Hold for gastritis. Check cr today.

## 2014-04-08 NOTE — Assessment & Plan Note (Signed)
Cervical irregularity feels like possible scar tissue from h/o ?colpo though it looks like she had endometrial bx, not colposcopy.  - Colposcopy clinic evaluation. - Pap smear with cotesting today.

## 2014-04-27 ENCOUNTER — Ambulatory Visit (INDEPENDENT_AMBULATORY_CARE_PROVIDER_SITE_OTHER): Payer: Medicaid Other | Admitting: Family Medicine

## 2014-04-27 ENCOUNTER — Encounter: Payer: Self-pay | Admitting: Family Medicine

## 2014-04-27 VITALS — BP 127/88 | HR 94 | Temp 98.2°F | Ht 65.5 in | Wt 206.2 lb

## 2014-04-27 DIAGNOSIS — J029 Acute pharyngitis, unspecified: Secondary | ICD-10-CM

## 2014-04-27 LAB — POCT RAPID STREP A (OFFICE): Rapid Strep A Screen: NEGATIVE

## 2014-04-27 NOTE — Patient Instructions (Addendum)
Good to see you today!  Thanks for coming in.  Your swab does not show any bacterial infection.  I think you have a virus.  It will get better in a few days  In the meantime use Tylenol and ibuprofen for pain Chloraseptic spray for pain Salt water gargle   If you are not better in one week or back to normal in 2 weeks or if you have high fever > 103 come back to see [place]

## 2014-04-27 NOTE — Assessment & Plan Note (Signed)
Acute consistent with viral cause. Rapid strep negative.  Symptomatic treatment and return in not resolving

## 2014-04-27 NOTE — Progress Notes (Signed)
   Subjective:    Patient ID: Katelyn Brown, female    DOB: February 26, 1970, 44 y.o.   MRN: [id number]  HPI  SORE THROAT  Sore throat began 3-4 days ago. Pain is: achy scratchy Severity: moderate Medications tried: aleve tylenol Strep throat exposure: no STD exposure: no  Symptoms Fever: mild Cough: no Runny nose: mild Muscle aches: no Swollen Glands: yes for 2 days in neck Trouble breathing: no Drooling: no Weight loss: no  Patient believes could be caused by: strep?   Had similar episode months ago.  Not sure her throat has been normal sincee  Review of Symptoms - see HPI PMH - Smoking status noted.     Review of Systems     Objective:   Physical Exam Alert no acute distress talkative Neck:  No deformities, thyromegaly, masses,  Mild anter cervial adenopathy and tenderness noted.   Supple with full range of motion without pain. Throat: normal mucosa, no exudate, uvula midline, no redness Lungs:  Normal respiratory effort, chest expands symmetrically. Lungs are clear to auscultation, no crackles or wheezes. Heart - Regular rate and rhythm.  No murmurs, gallops or rubs.    Skin:  Intact without suspicious lesions or rashes        Assessment & Plan:

## 2014-05-17 ENCOUNTER — Encounter: Payer: Self-pay | Admitting: Family Medicine

## 2014-05-17 NOTE — Telephone Encounter (Signed)
Discussed with Dr Nori Riis who does not think colposcopy needs to be performed and patient can continue regular cervical cancer screening. Will send letter.  Hilton Sinclair, MD

## 2014-05-24 ENCOUNTER — Ambulatory Visit (INDEPENDENT_AMBULATORY_CARE_PROVIDER_SITE_OTHER): Payer: Medicaid Other | Admitting: Family Medicine

## 2014-05-24 VITALS — BP 133/88 | HR 74 | Temp 98.5°F | Ht 65.5 in | Wt 207.0 lb

## 2014-05-24 DIAGNOSIS — R42 Dizziness and giddiness: Secondary | ICD-10-CM | POA: Insufficient documentation

## 2014-05-24 NOTE — Patient Instructions (Signed)
Great to meet you!  I would recommend coming back to see Dr. Gerlean Ren in 4 weeks  Try nasal saline gel for your nose dryness Consider a daily zyrtec for your sneezing and to see if it will help your ear symptoms.   Dizziness Dizziness is a common problem. It is a feeling of unsteadiness or light-headedness. You may feel like you are about to faint. Dizziness can lead to injury if you stumble or fall. A person of any age group can suffer from dizziness, but dizziness is more common in older adults. CAUSES  Dizziness can be caused by many different things, including:  Middle ear problems.  Standing for too long.  Infections.  An allergic reaction.  Aging.  An emotional response to something, such as the sight of blood.  Side effects of medicines.  Tiredness.  Problems with circulation or blood pressure.  Excessive use of alcohol or medicines, or illegal drug use.  Breathing too fast (hyperventilation).  An irregular heart rhythm (arrhythmia).  A low red blood cell count (anemia).  Pregnancy.  Vomiting, diarrhea, fever, or other illnesses that cause body fluid loss (dehydration).  Diseases or conditions such as Parkinson's disease, high blood pressure (hypertension), diabetes, and thyroid problems.  Exposure to extreme heat. DIAGNOSIS  Your health care provider will ask about your symptoms, perform a physical exam, and perform an electrocardiogram (ECG) to record the electrical activity of your heart. Your health care provider may also perform other heart or blood tests to determine the cause of your dizziness. These may include:  Transthoracic echocardiogram (TTE). During echocardiography, sound waves are used to evaluate how blood flows through your heart.  Transesophageal echocardiogram (TEE).  Cardiac monitoring. This allows your health care provider to monitor your heart rate and rhythm in real time.  Holter monitor. This is a portable device that records your  heartbeat and can help diagnose heart arrhythmias. It allows your health care provider to track your heart activity for several days if needed.  Stress tests by exercise or by giving medicine that makes the heart beat faster. TREATMENT  Treatment of dizziness depends on the cause of your symptoms and can vary greatly. HOME CARE INSTRUCTIONS   Drink enough fluids to keep your urine clear or pale yellow. This is especially important in very hot weather. In older adults, it is also important in cold weather.  Take your medicine exactly as directed if your dizziness is caused by medicines. When taking blood pressure medicines, it is especially important to get up slowly.  Rise slowly from chairs and steady yourself until you feel okay.  In the morning, first sit up on the side of the bed. When you feel okay, stand slowly while holding onto something until you know your balance is fine.  Move your legs often if you need to stand in one place for a long time. Tighten and relax your muscles in your legs while standing.  Have someone stay with you for 1-2 days if dizziness continues to be a problem. Do this until you feel you are well enough to stay alone. Have the person call your health care provider if he or she notices changes in you that are concerning.  Do not drive or use heavy machinery if you feel dizzy.  Do not drink alcohol. SEEK IMMEDIATE MEDICAL CARE IF:   Your dizziness or light-headedness gets worse.  You feel nauseous or vomit.  You have problems talking, walking, or using your arms, hands, or legs.  You feel weak.  You are not thinking clearly or you have trouble forming sentences. It may take a friend or family member to notice this.  You have chest pain, abdominal pain, shortness of breath, or sweating.  Your vision changes.  You notice any bleeding.  You have side effects from medicine that seems to be getting worse rather than better. MAKE SURE YOU:    Understand these instructions.  Will watch your condition.  Will get help right away if you are not doing well or get worse. Document Released: 07/31/2000 Document Revised: 02/09/2013 Document Reviewed: 08/24/2010 [facility] Patient Information 2015 [place], [place]. This information is not intended to replace advice given to you by your health care provider. Make sure you discuss any questions you have with your health care provider.

## 2014-05-24 NOTE — Assessment & Plan Note (Addendum)
Very vague symptoms without unifying theme, also with fatigue 4-6 weeks of dizziness with unclear etiology she has some symptoms of vertigo enzymes are not consistent with vertigo they described as unsteadiness Mild improvements with Antivert Dix-Hallpike negative today Neuro and ENT exam benign I feel BPPV, orthostasis,and migraine are unlikely etiology Labs collected in February or unrevealing for any kind of etiology Provided reassureance, red flags reviewed, f/u 4 weeks, with ear pain with noises would consider ENT referral if continued on f/u

## 2014-05-24 NOTE — Progress Notes (Signed)
Patient ID: Katelyn Brown, female   DOB: Feb 08, 1971, 44 y.o.   MRN: [id number]   HPI  Patient presents today for  Dizziness and fatigue  Patient explains that she's had dizziness and fatigue for the last 4-6 weeks. She describes her fatigue is general without any seeming cause or inciting event to start it.  She states that she's not taking any medications at this time except for pain medications for intermittent neck and thigh pain.  Dizziness  Described as alternating between feeling of unsteadiness which is predominantly during the day to  A room spinning type sensation that happens with severe episodes at night. She has no increased symptoms with head movement and no symptoms with standing.. There are no aggravating or alleviating  Factors. She's been taking Antivert which doesn't seem to help much. No fevers, chills, sweats, dyspnea, chest pain She is having intermittent right ear pain with noises. She denies  Tinnitus  She also describes intermittent sneezing and single episodes of vomiting at night which has worsened since our allergy season has started  Smoking status noted ROS: Per HPI  Objective: BP 133/88 mmHg  Pulse 74  Temp(Src) 98.5 F (36.9 C) (Oral)  Ht 5' 5.5" (1.664 m)  Wt 207 lb (93.895 kg)  BMI 33.91 kg/m2  LMP 05/16/2014 Gen: NAD, alert, cooperative with exam HEENT: NCAT, TMs WNL BL, [employer] negative, nares clear BL, Oropharynx clear CV: RRR, good S1/S2, no murmur Resp: CTABL, no wheezes, non-labored Ext: No edema, warm Neuro: Alert and oriented, CN 2-12 intact, Strength 5/5 and sensation intact in all 4 extremities  Assessment and plan:  Dizziness Very vague symptoms without unifying theme, also with fatigue 4-6 weeks of dizziness with unclear etiology she has some symptoms of vertigo enzymes are not consistent with vertigo they described as unsteadiness Mild improvements with Antivert Dix-Hallpike negative today Neuro and ENT exam benign I  feel BPPV, orthostasis,and migraine are unlikely etiology Labs collected in February or unrevealing for any kind of etiology Provided reassureance, red flags reviewed, f/u 4 weeks, with ear pain with noises would consider ENT referral if continued on f/u

## 2014-06-01 NOTE — Progress Notes (Signed)
I was the preceptor for this visit. 

## 2014-06-21 ENCOUNTER — Encounter: Payer: Self-pay | Admitting: Family Medicine

## 2014-06-21 ENCOUNTER — Ambulatory Visit (INDEPENDENT_AMBULATORY_CARE_PROVIDER_SITE_OTHER): Payer: Self-pay | Admitting: Family Medicine

## 2014-06-21 VITALS — BP 122/75 | HR 86 | Temp 98.3°F | Ht 65.5 in | Wt 204.0 lb

## 2014-06-21 DIAGNOSIS — R5383 Other fatigue: Secondary | ICD-10-CM

## 2014-06-21 DIAGNOSIS — L658 Other specified nonscarring hair loss: Secondary | ICD-10-CM

## 2014-06-21 NOTE — Patient Instructions (Addendum)
Please schedule an appointment with Katelyn Brown so she can help you obtain an [employer]. We will then try to schedule an appointment with you to check for Obstructive Sleep Apnea. I have included information about Sleep Apnea below. Your TSH and Blood Count were both normal in February and do not need to be checked at this time. Your hair loss is most consistent with Traction Alopecia.   Sleep Apnea  Sleep apnea is a sleep disorder characterized by abnormal pauses in breathing while you sleep. When your breathing pauses, the level of oxygen in your blood decreases. This causes you to move out of deep sleep and into light sleep. As a result, your quality of sleep is poor, and the system that carries your blood throughout your body (cardiovascular system) experiences stress. If sleep apnea remains untreated, the following conditions can develop:  High blood pressure (hypertension).  Coronary artery disease.  Inability to achieve or maintain an erection (impotence).  Impairment of your thought process (cognitive dysfunction). There are three types of sleep apnea: 1. Obstructive sleep apnea--Pauses in breathing during sleep because of a blocked airway. 2. Central sleep apnea--Pauses in breathing during sleep because the area of the brain that controls your breathing does not send the correct signals to the muscles that control breathing. 3. Mixed sleep apnea--A combination of both obstructive and central sleep apnea. RISK FACTORS The following risk factors can increase your risk of developing sleep apnea:  Being overweight.  Smoking.  Having narrow passages in your nose and throat.  Being of older age.  Being female.  Alcohol use.  Sedative and tranquilizer use.  Ethnicity. Among individuals younger than 35 years, African Americans are at increased risk of sleep apnea. SYMPTOMS   Difficulty staying asleep.  Daytime sleepiness and fatigue.  Loss of  energy.  Irritability.  Loud, heavy snoring.  Morning headaches.  Trouble concentrating.  Forgetfulness.  Decreased interest in sex. DIAGNOSIS  In order to diagnose sleep apnea, your caregiver will perform a physical examination. Your caregiver may suggest that you take a home sleep test. Your caregiver may also recommend that you spend the night in a sleep lab. In the sleep lab, several monitors record information about your heart, lungs, and brain while you sleep. Your leg and arm movements and blood oxygen level are also recorded. TREATMENT The following actions may help to resolve mild sleep apnea:  Sleeping on your side.   Using a decongestant if you have nasal congestion.   Avoiding the use of depressants, including alcohol, sedatives, and narcotics.   Losing weight and modifying your diet if you are overweight. There also are devices and treatments to help open your airway:  Oral appliances. These are custom-made mouthpieces that shift your lower jaw forward and slightly open your bite. This opens your airway.  Devices that create positive airway pressure. This positive pressure "splints" your airway open to help you breathe better during sleep. The following devices create positive airway pressure:  Continuous positive airway pressure (CPAP) device. The CPAP device creates a continuous level of air pressure with an air pump. The air is delivered to your airway through a mask while you sleep. This continuous pressure keeps your airway open.  Nasal expiratory positive airway pressure (EPAP) device. The EPAP device creates positive air pressure as you exhale. The device consists of single-use valves, which are inserted into each nostril and held in place by adhesive. The valves create very little resistance when you inhale but create much  more resistance when you exhale. That increased resistance creates the positive airway pressure. This positive pressure while you exhale  keeps your airway open, making it easier to breath when you inhale again.  Bilevel positive airway pressure (BPAP) device. The BPAP device is used mainly in patients with central sleep apnea. This device is similar to the CPAP device because it also uses an air pump to deliver continuous air pressure through a mask. However, with the BPAP machine, the pressure is set at two different levels. The pressure when you exhale is lower than the pressure when you inhale.  Surgery. Typically, surgery is only done if you cannot comply with less invasive treatments or if the less invasive treatments do not improve your condition. Surgery involves removing excess tissue in your airway to create a wider passage way. Document Released: 01/25/2002 Document Revised: 06/01/2012 Document Reviewed: 06/13/2011 [facility] Patient Information 2015 [place], [place]. This information is not intended to replace advice given to you by your health care provider. Make sure you discuss any questions you have with your health care provider.

## 2014-06-21 NOTE — Progress Notes (Signed)
Subjective:     Patient ID: Katelyn Brown, female   DOB: [birth year], 44 y.o.   MRN: [id number]  HPI Katelyn Brown is a 44yo female presenting today for fatigue, dizziness, and hair loss. - Recently seen for similar complaints on 05/24/14. Complained of 4-6 weeks of dizziness and fatigue. Reassurance given and instructed to follow up in 4 weeks - Reports hair falling out one month ago. Has always had thin hair. Previously followed by dermatology and reports being given medicine to prevent it from falling out. - Currently has weave in place. 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 Uses something to prevent traction on hair - Describes dizziness as feeling like she is going to pass out. Always happens when she is standing up for a while. Never happens when she is seated or lying down or when she is changing positions.  - Reports symptoms of dizziness and fatigue have been present since March - Reports history of snoring at night - TSH normal in 03/2014 - CBC with hemoglobin 12.4 in 03/2014. History of heavy menstrual bleeding due to fibroids - Found out today that her Medicaid is no longer valid  Review of Systems  Constitutional: Positive for fatigue.  Neurological: Positive for dizziness.       Objective:   Physical Exam  Constitutional: She is oriented to person, place, and time. She appears well-nourished. No distress.  HENT:  Head: Normocephalic and atraumatic.  Weave in place. Obtained picture below from patient's cell phone.  Cardiovascular: Normal rate and regular rhythm.  Exam reveals no gallop and no friction rub.   No murmur heard. Pulmonary/Chest: Effort normal. No respiratory distress. She has no wheezes. She has no rales.  Abdominal: Soft. She exhibits no distension. There is no tenderness.  Musculoskeletal: Normal range of motion.  Neurological: She is alert and oriented to person, place, and time. No cranial nerve deficit. Coordination normal.  Skin: Skin is warm. No rash  noted. No erythema.       Assessment:     Please refer to Problem List for Assessment.    Plan:     Please refer to Problem List for Plan.

## 2014-06-22 DIAGNOSIS — L658 Other specified nonscarring hair loss: Secondary | ICD-10-CM | POA: Insufficient documentation

## 2014-06-22 NOTE — Assessment & Plan Note (Addendum)
-   TSH normal in 03/2014 - CBC with hemoglobin 12.4 in 03/2014 - Unfortunately, necessary referrals require insurance or orange card. Will meet with Katelyn Brown for financial assistance. Instructed to call office once she has meeting. - Plan to refer for sleep study when possible. BP normal, but reports fatigue throughout the day and falling asleep when sitting down. Reports history of snoring. - Obtain PHQ-9 at next visit. Is not taking Celexa as prescribed and may be contributing to symptoms

## 2014-06-22 NOTE — Assessment & Plan Note (Signed)
-   Could not be examined today due to weave in place. Photo obtained and placed in note - Considered most likely cause of alopecia given distrubution. Katelyn Brown denies, stating this cannot occur with the type of weave she uses.  - Consider referral to Dermatology, however will not except Medicaid or Orange Card - Consider Minoxidil if no improvement

## 2014-10-26 ENCOUNTER — Ambulatory Visit (INDEPENDENT_AMBULATORY_CARE_PROVIDER_SITE_OTHER): Payer: 59 | Admitting: Certified Nurse Midwife

## 2014-10-26 ENCOUNTER — Encounter: Payer: Self-pay | Admitting: Certified Nurse Midwife

## 2014-10-26 VITALS — BP 131/85 | HR 85 | Temp 97.6°F | Ht 65.5 in | Wt 211.0 lb

## 2014-10-26 DIAGNOSIS — B3731 Acute candidiasis of vulva and vagina: Secondary | ICD-10-CM

## 2014-10-26 DIAGNOSIS — B373 Candidiasis of vulva and vagina: Secondary | ICD-10-CM

## 2014-10-26 MED ORDER — TERCONAZOLE 0.4 % VA CREA: 1.0000 | TOPICAL_CREAM | Freq: Every day | VAGINAL | Status: DC

## 2014-10-26 MED ORDER — FLUCONAZOLE 100 MG PO TABS: 100.0000 mg | ORAL_TABLET | Freq: Once | ORAL | Status: DC
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
## 2014-10-26 NOTE — Addendum Note (Signed)
Addended by: Lewie Loron D on: 10/26/2014 02:19 PM   Modules accepted: Orders

## 2014-11-02 ENCOUNTER — Other Ambulatory Visit: Payer: Self-pay | Admitting: Obstetrics

## 2014-11-02 LAB — SURESWAB, VAGINOSIS/VAGINITIS PLUS
ATOPOBIUM VAGINAE: NOT DETECTED Log (cells/mL)
C. ALBICANS, DNA: DETECTED — AB
C. PARAPSILOSIS, DNA: DETECTED — AB
C. TRACHOMATIS RNA, TMA: NOT DETECTED
C. TROPICALIS, DNA: NOT DETECTED
C. glabrata, DNA: NOT DETECTED
Gardnerella vaginalis: NOT DETECTED Log (cells/mL)
LACTOBACILLUS SPECIES: NOT DETECTED Log (cells/mL)
MEGASPHAERA SPECIES: NOT DETECTED Log (cells/mL)
N. gonorrhoeae RNA, TMA: NOT DETECTED
T. VAGINALIS RNA, QL TMA: NOT DETECTED

## 2014-12-04 ENCOUNTER — Encounter (HOSPITAL_COMMUNITY): Payer: Self-pay | Admitting: Emergency Medicine

## 2014-12-04 ENCOUNTER — Emergency Department (HOSPITAL_COMMUNITY)
Admission: EM | Admit: 2014-12-04 | Discharge: 2014-12-04 | Disposition: A | Discharge status: Home / Self Care | Payer: 59 | Attending: Emergency Medicine | Admitting: Emergency Medicine

## 2014-12-04 DIAGNOSIS — M2550 Pain in unspecified joint: Secondary | ICD-10-CM | POA: Insufficient documentation

## 2014-12-04 DIAGNOSIS — Z8742 Personal history of other diseases of the female genital tract: Secondary | ICD-10-CM | POA: Diagnosis not present

## 2014-12-04 DIAGNOSIS — Z79899 Other long term (current) drug therapy: Secondary | ICD-10-CM | POA: Insufficient documentation

## 2014-12-04 DIAGNOSIS — W57XXXA Bitten or stung by nonvenomous insect and other nonvenomous arthropods, initial encounter: Secondary | ICD-10-CM | POA: Insufficient documentation

## 2014-12-04 DIAGNOSIS — L03113 Cellulitis of right upper limb: Secondary | ICD-10-CM | POA: Diagnosis not present

## 2014-12-04 DIAGNOSIS — Z8659 Personal history of other mental and behavioral disorders: Secondary | ICD-10-CM | POA: Insufficient documentation

## 2014-12-04 DIAGNOSIS — Z8719 Personal history of other diseases of the digestive system: Secondary | ICD-10-CM | POA: Insufficient documentation

## 2014-12-04 DIAGNOSIS — Y998 Other external cause status: Secondary | ICD-10-CM | POA: Diagnosis not present

## 2014-12-04 DIAGNOSIS — Z86018 Personal history of other benign neoplasm: Secondary | ICD-10-CM | POA: Diagnosis not present

## 2014-12-04 DIAGNOSIS — Y9389 Activity, other specified: Secondary | ICD-10-CM | POA: Diagnosis not present

## 2014-12-04 DIAGNOSIS — Y92008 Other place in unspecified non-institutional (private) residence as the place of occurrence of the external cause: Secondary | ICD-10-CM | POA: Insufficient documentation

## 2014-12-04 DIAGNOSIS — Z8669 Personal history of other diseases of the nervous system and sense organs: Secondary | ICD-10-CM | POA: Insufficient documentation

## 2014-12-04 DIAGNOSIS — S50861A Insect bite (nonvenomous) of right forearm, initial encounter: Secondary | ICD-10-CM | POA: Diagnosis present

## 2014-12-04 DIAGNOSIS — Z8619 Personal history of other infectious and parasitic diseases: Secondary | ICD-10-CM | POA: Diagnosis not present

## 2014-12-04 MED ORDER — TRAMADOL HCL 50 MG PO TABS: 50.0000 mg | ORAL_TABLET | Freq: Four times a day (QID) | ORAL | Status: DC | PRN

## 2014-12-04 MED ORDER — CEPHALEXIN 500 MG PO CAPS: 500.0000 mg | ORAL_CAPSULE | Freq: Three times a day (TID) | ORAL | Status: DC

## 2014-12-04 MED ORDER — CEPHALEXIN 500 MG PO CAPS
500.0000 mg | ORAL_CAPSULE | Freq: Once | ORAL | Status: AC
  Administered 2014-12-04: 500 mg via ORAL
  Filled 2014-12-04: qty 1

## 2014-12-04 MED ORDER — FAMOTIDINE 20 MG PO TABS: 20.0000 mg | ORAL_TABLET | Freq: Once | ORAL | Status: DC

## 2014-12-04 MED ORDER — FAMOTIDINE 20 MG PO TABS
20.0000 mg | ORAL_TABLET | Freq: Once | ORAL | Status: AC
  Administered 2014-12-04: 20 mg via ORAL
  Filled 2014-12-04: qty 1

## 2014-12-04 NOTE — Discharge Instructions (Signed)
Cellulitis Cellulitis is an infection of the skin and the tissue beneath it. The infected area is usually red and tender. Cellulitis occurs most often in the arms and lower legs.  CAUSES  Cellulitis is caused by bacteria that enter the skin through cracks or cuts in the skin. The most common types of bacteria that cause cellulitis are staphylococci and streptococci. SIGNS AND SYMPTOMS   Redness and warmth.  Swelling.  Tenderness or pain.  Fever. DIAGNOSIS  Your health care provider can usually determine what is wrong based on a physical exam. Blood tests may also be done. TREATMENT  Treatment usually involves taking an antibiotic medicine. HOME CARE INSTRUCTIONS   Take your antibiotic medicine as directed by your health care provider. Finish the antibiotic even if you start to feel better.  Keep the infected arm or leg elevated to reduce swelling.  Apply a warm cloth to the affected area up to 4 times per day to relieve pain.  Take medicines only as directed by your health care provider.  Keep all follow-up visits as directed by your health care provider. SEEK MEDICAL CARE IF:   You notice red streaks coming from the infected area.  Your red area gets larger or turns dark in color.  Your bone or joint underneath the infected area becomes painful after the skin has healed.  Your infection returns in the same area or another area.  You notice a swollen bump in the infected area.  You develop new symptoms.  You have a fever. SEEK IMMEDIATE MEDICAL CARE IF:   You feel very sleepy.  You develop vomiting or diarrhea.  You have a general ill feeling (malaise) with muscle aches and pains.   This information is not intended to replace advice given to you by your health care provider. Make sure you discuss any questions you have with your health care provider.   Document Released: 11/14/2004 Document Revised: 10/26/2014 Document Reviewed: 04/22/2011 Elsevier Interactive  Patient Education [employer]. The area of swelling and redness has been outlined.  Please watch this carefully over the next 48 hours.  If it does go outside of this outlined area.  Please return for further evaluation or if he develops fever, nausea, vomiting, headache, dizziness.  Please take the antibiotic as directed, as well as the Pepcid which is an antihistamine which will help with the swelling.  You also have been given pain medication

## 2014-12-04 NOTE — ED Notes (Signed)
60 yof presents to ED with c/o insect bite. Patient states she has been getting bit multiple times in the past 3 weeks. Patient states she thinks it is a spider that is biting her. 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 Patient states the first time she was bite it was 5 bites all in a line. Patient states she thought that, that time it was bed bugs. Patient has used benadryl and ice of previous bites with relief. Now patient has large circular bite to right elbow that is not getting any relief from benadryl or ice. Right elbow is swollen and red. Patient c/o pain and itching.

## 2014-12-04 NOTE — ED Notes (Signed)
Patient D/C'd instructions, and medications reviewed.
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
## 2014-12-05 ENCOUNTER — Encounter (HOSPITAL_COMMUNITY): Payer: Self-pay

## 2014-12-05 ENCOUNTER — Emergency Department (HOSPITAL_COMMUNITY)
Admission: EM | Admit: 2014-12-05 | Discharge: 2014-12-05 | Disposition: A | Discharge status: Home / Self Care | Payer: 59 | Attending: Emergency Medicine | Admitting: Emergency Medicine

## 2014-12-05 DIAGNOSIS — F419 Anxiety disorder, unspecified: Secondary | ICD-10-CM | POA: Diagnosis not present

## 2014-12-05 DIAGNOSIS — Z86018 Personal history of other benign neoplasm: Secondary | ICD-10-CM | POA: Diagnosis not present

## 2014-12-05 DIAGNOSIS — Z8742 Personal history of other diseases of the female genital tract: Secondary | ICD-10-CM | POA: Insufficient documentation

## 2014-12-05 DIAGNOSIS — L03113 Cellulitis of right upper limb: Secondary | ICD-10-CM | POA: Insufficient documentation

## 2014-12-05 DIAGNOSIS — Z8669 Personal history of other diseases of the nervous system and sense organs: Secondary | ICD-10-CM | POA: Insufficient documentation

## 2014-12-05 DIAGNOSIS — Z79899 Other long term (current) drug therapy: Secondary | ICD-10-CM | POA: Diagnosis not present

## 2014-12-05 MED ORDER — VANCOMYCIN HCL IN DEXTROSE 1-5 GM/200ML-% IV SOLN
1000.0000 mg | Freq: Once | INTRAVENOUS | Status: AC
  Administered 2014-12-05: 1000 mg via INTRAVENOUS
  Filled 2014-12-05: qty 200

## 2014-12-05 MED ORDER — DOXYCYCLINE HYCLATE 100 MG PO CAPS: 100.0000 mg | ORAL_CAPSULE | Freq: Two times a day (BID) | ORAL | Status: DC

## 2014-12-05 NOTE — Discharge Instructions (Signed)
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
## 2014-12-05 NOTE — ED Notes (Signed)
Pt here for insect bite and cellulitis to right elbow, seen at [facility] tonight but swelling has increased beyond borders marked at [facility].

## 2014-12-29 ENCOUNTER — Ambulatory Visit (INDEPENDENT_AMBULATORY_CARE_PROVIDER_SITE_OTHER): Payer: 59 | Admitting: Certified Nurse Midwife

## 2014-12-29 ENCOUNTER — Telehealth: Payer: Self-pay

## 2014-12-29 ENCOUNTER — Encounter: Payer: Self-pay | Admitting: Certified Nurse Midwife

## 2014-12-29 VITALS — BP 122/83 | HR 76 | Temp 98.6°F | Ht 65.5 in | Wt 213.0 lb

## 2014-12-29 DIAGNOSIS — R1032 Left lower quadrant pain: Secondary | ICD-10-CM | POA: Diagnosis not present

## 2014-12-29 DIAGNOSIS — R1031 Right lower quadrant pain: Secondary | ICD-10-CM | POA: Diagnosis not present
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
## 2014-12-29 NOTE — Telephone Encounter (Signed)
called patient to let her know of appt with Dr. Maryland Pink was sch on 01/25/15 at 1pm, she was fine with that appt. date and time  LB Gertie Fey will call her directly for that appt.

## 2015-01-03 ENCOUNTER — Encounter: Payer: Self-pay | Admitting: Physician Assistant

## 2015-01-09 ENCOUNTER — Encounter: Payer: Self-pay | Admitting: Physician Assistant

## 2015-01-09 ENCOUNTER — Ambulatory Visit (INDEPENDENT_AMBULATORY_CARE_PROVIDER_SITE_OTHER): Payer: 59 | Admitting: Physician Assistant

## 2015-01-09 VITALS — BP 100/80 | HR 64 | Ht 65.0 in | Wt 169.0 lb

## 2015-01-09 DIAGNOSIS — R0789 Other chest pain: Secondary | ICD-10-CM

## 2015-01-09 DIAGNOSIS — R1030 Lower abdominal pain, unspecified: Secondary | ICD-10-CM

## 2015-01-09 DIAGNOSIS — R1013 Epigastric pain: Secondary | ICD-10-CM

## 2015-01-09 DIAGNOSIS — R079 Chest pain, unspecified: Secondary | ICD-10-CM

## 2015-01-09 MED ORDER — GLYCOPYRROLATE 2 MG PO TABS: 2.0000 mg | ORAL_TABLET | Freq: Two times a day (BID) | ORAL | Status: DC

## 2015-01-09 NOTE — Progress Notes (Signed)
Patient ID: Katelyn Brown, female   DOB: [birth year], 44 y.o.   MRN: [id number]   Subjective:    Patient ID: Katelyn Brown, female    DOB: [birth year], 44 y.o.   MRN: [id number]  HPI Katelyn Brown  Is a pleasant 44 year old African-American female , new to GI referred by Eliseo Squires CNM , for evaluation of epigastric/subxiphoid pain that radiates to the back. Patient has history of fairly chronic pelvic pain which is worsened around the time of her menses. She is being considered for hysterectomy. She is status post laparoscopic cholecystectomy in February 2015 and has history of depression. She also states that she had a colonoscopy about 9 years ago in [place] but does not recall who did the procedure.  She states that her current pain in the subxiphoid area has been present for about a year and is fairly constant. She says that standing for long periods of time seems to worsen the discomfort and causes pressure to radiate through her back. She points to her lower sternum as the origin of her pain. She does not have any clear exertional symptoms. She does not feel that her discomfort is related to eating and has no complaints of heartburn or indigestion. No dysphagia or odynophagia. He says if she eats very late at night she may have some heartburn. Up she has been told that she has IBS and says she has had more urgency for bowel movement since having gallbladder surgery and sometimes her stools are loose. She's not noted any melena or hematochezia.   She  does not do any heavy lifting or weight lifting but has been employed as a Theme park manager and says that standing for her work again aggravates the subxiphoid pain. She also had been wearing a "waist  training " garment which is a type of corset device and wonders if this may have been aggravating her ribs/sternal discomfort as well.  Review of Systems Pertinent positive and negative review of systems were noted in the above HPI section.  All other  review of systems was otherwise negative.  Outpatient Encounter Prescriptions as of 01/09/2015  Medication Sig  . B Complex-Biotin-FA (B COMPLETE PO) Take 1 tablet by mouth daily.  . Cholecalciferol (VITAMIN D3) 3000 UNITS TABS Take 1 tablet by mouth daily.  . vitamin B-12 (CYANOCOBALAMIN) 1000 MCG tablet Take 1,000 mcg by mouth daily.  . vitamin E 200 UNIT capsule Take 200 Units by mouth daily.  Marland Kitchen glycopyrrolate (ROBINUL-FORTE) 2 MG tablet Take 1 tablet (2 mg total) by mouth 2 (two) times daily.   No facility-administered encounter medications on file as of 01/09/2015.   No Known Allergies Patient Active Problem List   Diagnosis Date Noted  . Traction alopecia 06/22/2014  . Dizziness 05/24/2014  . Pelvic pain in female 04/08/2014  . Abnormality of cervix 04/08/2014  . Fatigue 04/08/2014  . Dysuria 03/04/2014  . Sore throat 10/06/2013  . 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 Dysfunctional uterine bleeding 03/24/2012  . Annual physical exam 03/13/2012  . Euthyroid goiter 08/31/2011  . Seasonal allergies 07/26/2011  . HEADACHE, TENSION 10/31/2009  . OBESITY, NOS 04/17/2006  . DEPRESSION, MAJOR, RECURRENT 04/17/2006  . Irritable bowel syndrome 04/17/2006   Social History   Social History  . Marital Status: Single    Spouse Name: N/A  . Number of Children: N/A  . Years of Education: N/A   Occupational History  . Not on file.   Social History Main Topics  . Smoking status: Never Smoker   .  Smokeless tobacco: Never Used  . Alcohol Use: 0.0 oz/week    0 Standard drinks or equivalent per week     Comment: Occasional- socially   . Drug Use: No  . Sexual Activity:    Partners: Male    Birth Control/ Protection: Surgical   Other Topics Concern  . Not on file   Social History Narrative    Ms. Hiraldo's family history includes Cancer in her maternal aunt and maternal uncle.      Objective:    Filed Vitals:   01/09/15 0910  BP: 100/80  Pulse: 64     Physical Exam   Well-developed African American female in no acute distress, pleasant blood pressure 100/80 pulse 64 height 5 foot 5 weight 169. HEENT nontraumatic normocephalic EOMI PERRLA sclera anicteric, cardiovascular regular rate and rhythm with S1-S2 no murmur or gallop, Pulmonary ;clear bilaterally , she is quite tender with palpation of the sternum and anterior ribs lower , bilaterally , left greater than right. Abdomen; soft some mild bilateral lower abdominal tenderness no guarding or rebound no palpable mass or hepatosplenomegaly bowel sounds are present, Rectal; exam not done, Ext; no clubbing cyanosis or edema skin warm dry, Neuropsych; mood and affect appropriate       Assessment & Plan:   #1 44 yo female with one year hx of constant subxyphoid pain, intermittently radiating to her back- worsened by standing. She is quire tender to palpation of lower sternum and lower anterior ribs- suspect musculoskeletal -likely costochondritis #2 s/p lap cholecystectomy #3 chronic pelvic pain- fibroids, hx of pelvic congestion syndrome- being considered for hysterectomy #4 IBS-urgency post prandially  Plan; CXR, and rib details Trial of tylenol BID , local heat  Ct abd/pelvis to r/o any intraabdominal inflammatory process Trial of Robinul forte 2 mg qam Further plans pending results of above  Pt will be established with Dr Fuller Plan   Alfredia Ferguson PA-C 01/09/2015   Cc: Morene Crocker, CNM

## 2015-01-09 NOTE — Patient Instructions (Addendum)
Please go to the basement level to have your labs drawn.  We did send a prescription for Robinul forte to [facility], [place]  Take Tylenol 500 mg by mouth twice daily for rib pain.  You can use a heating pad for the pain.  Call[place] if you want to proceed with the xrays and CT scan.

## 2015-01-09 NOTE — Progress Notes (Signed)
Reviewed and agree with management plan.  Willisha Sligar T. Honestii Marton, MD FACG 

## 2015-01-20 ENCOUNTER — Ambulatory Visit (INDEPENDENT_AMBULATORY_CARE_PROVIDER_SITE_OTHER): Payer: 59 | Admitting: Family Medicine

## 2015-01-20 VITALS — BP 110/68 | HR 87 | Temp 98.3°F | Wt 212.0 lb

## 2015-01-20 DIAGNOSIS — R1013 Epigastric pain: Secondary | ICD-10-CM | POA: Diagnosis not present

## 2015-01-20 DIAGNOSIS — R300 Dysuria: Secondary | ICD-10-CM | POA: Diagnosis not present

## 2015-01-20 DIAGNOSIS — R1084 Generalized abdominal pain: Secondary | ICD-10-CM | POA: Diagnosis not present

## 2015-01-20 DIAGNOSIS — M546 Pain in thoracic spine: Secondary | ICD-10-CM

## 2015-01-20 LAB — POCT UA - MICROSCOPIC ONLY

## 2015-01-20 LAB — POCT URINALYSIS DIPSTICK
Bilirubin, UA: NEGATIVE
Glucose, UA: NEGATIVE
Ketones, UA: NEGATIVE
Leukocytes, UA: NEGATIVE
Nitrite, UA: NEGATIVE
PH UA: 6
Protein, UA: NEGATIVE
Spec Grav, UA: 1.02
UROBILINOGEN UA: 0.2

## 2015-01-20 MED ORDER — METHOCARBAMOL 500 MG PO TABS: 500.0000 mg | ORAL_TABLET | Freq: Three times a day (TID) | ORAL | Status: DC | PRN

## 2015-01-20 NOTE — Patient Instructions (Addendum)
Thank you so much for coming to visit me today. Please call [facility] [phone]) concerning your CT scan scheduling. If you have problems with this please let me know. I have placed a referral to Gastroenterology. I have also placed a referral to physical therapy for your back pain and sent in a prescription for a muscle relaxer. You may also use over the counter pain ointments and patches, such as Salonpas.   Thanks again! Dr. Gerlean Ren

## 2015-01-21 DIAGNOSIS — M549 Dorsalgia, unspecified: Secondary | ICD-10-CM | POA: Insufficient documentation

## 2015-01-21 DIAGNOSIS — R109 Unspecified abdominal pain: Secondary | ICD-10-CM | POA: Insufficient documentation

## 2015-01-21 DIAGNOSIS — M5441 Lumbago with sciatica, right side: Secondary | ICD-10-CM | POA: Insufficient documentation

## 2015-01-21 NOTE — Assessment & Plan Note (Signed)
-   Suspect unrelated to abdominal pain. Tight paraspinal muscles right worse than left, no midline tenderness. - Prescription for Robaxin given - Given chronic nature >1 year, will refer to PT

## 2015-01-21 NOTE — Assessment & Plan Note (Signed)
-   Referral to GI to continue workup already initiated. - Contact GI concerning scheduling of CTscan - Recommend food diary in relation to pain

## 2015-01-21 NOTE — Progress Notes (Signed)
Subjective:     Patient ID: Katelyn Brown, female   DOB: 1970-10-08, 44 y.o.   MRN: [id number]  HPI Mrs. Chiappa is a 44yo female presenting today for abdominal pain. - Recently seen by Gastroenterology for same complaint. States visit was cut short due to insurance problems. Requires updated insurance card and referral from PCP to complete visit. - GI placed order for CT scan abdomen/pelvis - History of cholecystectomy in 2015. States she has had abdominal discomfort since that time - States pain is intermittent, worse with standing - Pain located in epigastric region and in thoracic spine - Denies acid reflux - Chart Review:  Gastroenterology visit 01/09/2015. Pain epigastric in location with radiation to back. Present for one year. No relation to eating. Pain aggravated by standing for a long time. Has also been wearing "waist training" device that she is worried might be contributing to pain. History of chronic pelvic pain worsened with menses. Last colonoscopy 9 years ago. Robinul prescribed and CT abdomen/pelvic ordered.  OB visit on 12/29/14. Reported pain in lower quadrants and right upper quadrant. History of fibroids noted. Periods unbearable. Reported worsening epigastric pain as well. Considering hysterectomy, but not scheduled.  Review of Systems Per HPI    Objective:   Physical Exam  Constitutional: She appears well-developed and well-nourished. No distress.  Cardiovascular: Normal rate and regular rhythm.  Exam reveals no gallop and no friction rub.   No murmur heard. Pulmonary/Chest: Effort normal. No respiratory distress. She has no wheezes.  Abdominal: Soft. Bowel sounds are normal.  Diffuse tenderness, worse in epigastric area. Negative Murphy's. Negative Rebound. Negative Rovsings. Positive Cornettes with significant musculoskeletal pain noted.   Musculoskeletal:  Tight paraspinal muscles with increased tenderness, right worse than left. No midline tenderness.   Psychiatric: She has a normal mood and affect. Her behavior is normal.      Assessment and Plan:     Abdominal pain - Referral to GI to continue workup already initiated. - Contact GI concerning scheduling of CTscan - Recommend food diary in relation to pain  Back pain - Suspect unrelated to abdominal pain. Tight paraspinal muscles right worse than left, no midline tenderness. - Prescription for Robaxin given - Given chronic nature >1 year, will refer to PT

## 2015-01-26 ENCOUNTER — Ambulatory Visit: Payer: 59 | Attending: Family Medicine | Admitting: Physical Therapy

## 2015-01-26 DIAGNOSIS — M546 Pain in thoracic spine: Secondary | ICD-10-CM

## 2015-01-26 DIAGNOSIS — M6281 Muscle weakness (generalized): Secondary | ICD-10-CM | POA: Diagnosis present

## 2015-01-26 DIAGNOSIS — M2560 Stiffness of unspecified joint, not elsewhere classified: Secondary | ICD-10-CM | POA: Diagnosis present

## 2015-01-26 DIAGNOSIS — R29898 Other symptoms and signs involving the musculoskeletal system: Secondary | ICD-10-CM
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
## 2015-02-02 ENCOUNTER — Ambulatory Visit: Payer: 59 | Admitting: Physical Therapy

## 2015-02-07 ENCOUNTER — Ambulatory Visit: Payer: 59 | Admitting: Physical Therapy

## 2015-05-11 ENCOUNTER — Ambulatory Visit (INDEPENDENT_AMBULATORY_CARE_PROVIDER_SITE_OTHER): Payer: BLUE CROSS/BLUE SHIELD | Admitting: Family Medicine

## 2015-05-11 ENCOUNTER — Encounter: Payer: Self-pay | Admitting: Family Medicine

## 2015-05-11 VITALS — BP 129/92 | HR 88 | Temp 98.2°F | Wt 207.0 lb

## 2015-05-11 DIAGNOSIS — R1013 Epigastric pain: Secondary | ICD-10-CM | POA: Diagnosis not present

## 2015-05-11 DIAGNOSIS — M546 Pain in thoracic spine: Secondary | ICD-10-CM | POA: Diagnosis not present

## 2015-05-11 MED ORDER — PANTOPRAZOLE SODIUM 40 MG PO TBEC: 40.0000 mg | DELAYED_RELEASE_TABLET | Freq: Every day | ORAL | Status: DC

## 2015-05-11 NOTE — Patient Instructions (Signed)
Thank you so much for coming to visit today! - Continue current pain medication. Avoid Ibuprofen. - You may use Salonpas patches, massage for back pain. - Continue exercises shown by PT - I have sent in a medication for you to take once a day for 8 weeks for your abdominal pain. - Please follow up in 8 weeks.  Thanks again! Dr. Gerlean Ren  Gastroesophageal Reflux Disease, Adult Normally, food travels down the esophagus and stays in the stomach to be digested. However, when a person has gastroesophageal reflux disease (GERD), food and stomach acid move back up into the esophagus. When this happens, the esophagus becomes sore and inflamed. Over time, GERD can create small holes (ulcers) in the lining of the esophagus.  CAUSES This condition is caused by a problem with the muscle between the esophagus and the stomach (lower esophageal sphincter, or LES). Normally, the LES muscle closes after food passes through the esophagus to the stomach. When the LES is weakened or abnormal, it does not close properly, and that allows food and stomach acid to go back up into the esophagus. The LES can be weakened by certain dietary substances, medicines, and medical conditions, including:  Tobacco use.  Pregnancy.  Having a hiatal hernia.  Heavy alcohol use.  Certain foods and beverages, such as coffee, chocolate, onions, and peppermint. RISK FACTORS This condition is more likely to develop in:  People who have an increased body weight.  People who have connective tissue disorders.  People who use NSAID medicines. SYMPTOMS Symptoms of this condition include:  Heartburn.  Difficult or painful swallowing.  The feeling of having a lump in the throat.  Abitter taste in the mouth.  Bad breath.  Having a large amount of saliva.  Having an upset or bloated stomach.  Belching.  Chest pain.  Shortness of breath or wheezing.  Ongoing (chronic) cough or a night-time cough.  Wearing away of  tooth enamel.  Weight loss. Different conditions can cause chest pain. Make sure to see your health care provider if you experience chest pain. DIAGNOSIS Your health care provider will take a medical history and perform a physical exam. To determine if you have mild or severe GERD, your health care provider may also monitor how you respond to treatment. You may also have other tests, including:  An endoscopy toexamine your stomach and esophagus with a small camera.  A test thatmeasures the acidity level in your esophagus.  A test thatmeasures how much pressure is on your esophagus.  A barium swallow or modified barium swallow to show the shape, size, and functioning of your esophagus. TREATMENT The goal of treatment is to help relieve your symptoms and to prevent complications. Treatment for this condition may vary depending on how severe your symptoms are. Your health care provider may recommend:  Changes to your diet.  Medicine.  Surgery. HOME CARE INSTRUCTIONS Diet  Follow a diet as recommended by your health care provider. This may involve avoiding foods and drinks such as:  Coffee and tea (with or without caffeine).  Drinks that containalcohol.  Energy drinks and sports drinks.  Carbonated drinks or sodas.  Chocolate and cocoa.  Peppermint and mint flavorings.  Garlic and onions.  Horseradish.  Spicy and acidic foods, including peppers, chili powder, curry powder, vinegar, hot sauces, and barbecue sauce.  Citrus fruit juices and citrus fruits, such as oranges, lemons, and limes.  Tomato-based foods, such as red sauce, chili, salsa, and pizza with red sauce.  Maceo Pro and  fatty foods, such as donuts, french fries, potato chips, and high-fat dressings.  High-fat meats, such as hot dogs and fatty cuts of red and white meats, such as rib eye steak, sausage, ham, and bacon.  High-fat dairy items, such as whole milk, butter, and cream cheese.  Eat small,  frequent meals instead of large meals.  Avoid drinking large amounts of liquid with your meals.  Avoid eating meals during the 2-3 hours before bedtime.  Avoid lying down right after you eat.  Do not exercise right after you eat. General Instructions  Pay attention to any changes in your symptoms.  Take over-the-counter and prescription medicines only as told by your health care provider. Do not take aspirin, ibuprofen, or other NSAIDs unless your health care provider told you to do so.  Do not use any tobacco products, including cigarettes, chewing tobacco, and e-cigarettes. If you need help quitting, ask your health care provider.  Wear loose-fitting clothing. Do not wear anything tight around your waist that causes pressure on your abdomen.  Raise (elevate) the head of your bed 6 inches (15cm).  Try to reduce your stress, such as with yoga or meditation. If you need help reducing stress, ask your health care provider.  If you are overweight, reduce your weight to an amount that is healthy for you. Ask your health care provider for guidance about a safe weight loss goal.  Keep all follow-up visits as told by your health care provider. This is important. SEEK MEDICAL CARE IF:  You have new symptoms.  You have unexplained weight loss.  You have difficulty swallowing, or it hurts to swallow.  You have wheezing or a persistent cough.  Your symptoms do not improve with treatment.  You have a hoarse voice. SEEK IMMEDIATE MEDICAL CARE IF:  You have pain in your arms, neck, jaw, teeth, or back.  You feel sweaty, dizzy, or light-headed.  You have chest pain or shortness of breath.  You vomit and your vomit looks like blood or coffee grounds.  You faint.  Your stool is bloody or black.  You cannot swallow, drink, or eat.   This information is not intended to replace advice given to you by your health care provider. Make sure you discuss any questions you have with  your health care provider.   Document Released: 11/14/2004 Document Revised: 10/26/2014 Document Reviewed: 06/01/2014 Elsevier Interactive Patient Education [employer].

## 2015-05-12 NOTE — Progress Notes (Signed)
Subjective:     Patient ID: Katelyn Brown, female   DOB: 1971-01-08, 45 y.o.   MRN: [id number]  HPI Katelyn Brown is a 45yo female presenting with back pain and epigastric pain. - Pain located over left back and epigastric area - Has tried GasX, which improved symptoms - Has used Hydromorphone, which she had from previous surgery. Helped with symptoms - Notes abdominal pain is better when laying on her side - Abdominal pain is worse when standing - Has noticed a sour taste in her back for the past several weeks - Abdominal pain present for the last several weeks. - Abdominal pain not correlated with food - Back pain has gradually been getting better - Denies saddle anesthesia, fecal or urinary incontinence - Denies diaphoresis, shortness of breath - Back pain always present - Has used Robaxin and Flexeril in the past without relief - Went to PT once. Did not like it so she didn't go back. Not interested in further PT. - Never Smoker  Review of Systems Per HPI. Other systems negative.    Objective:   Physical Exam  Constitutional: She appears well-developed and well-nourished. No distress.  Cardiovascular: Normal rate and regular rhythm.  Exam reveals no gallop and no friction rub.   No murmur heard. Pulmonary/Chest: Effort normal. No respiratory distress. She has no wheezes. She has no rales.  Abdominal: Soft. She exhibits no distension.  Epigastric tenderness. Negative Murphy's. No rebound. Negative Rovsing's.   Musculoskeletal:  Tenderness over left thoracic paraspinal muscles  Neurological: No cranial nerve deficit.  Muscle strength 5/5 in upper and lower extremities, no decreased sensation noted  Psychiatric: She has a normal mood and affect. Her behavior is normal.      Assessment and Plan:     1. Abdominal pain, epigastric - Protonix x8 weeks - Avoid Ibuprofen - Handout given - Follow up in 8 weeks  2. Left-sided thoracic back pain - Has tried Flexeril and  Robaxin without relief. Not interested in further muscle relaxers. - Continue current pain regimen. Recommend avoiding Ibuprofen given above diagnosis. - Salonpas patches - Continue exercises shown at PT - Follow up in 8 weeks. May offer Steroid/Toradol injection at that time if improvement in abdominal pain noted.

## 2015-06-02 IMAGING — US US TRANSVAGINAL NON-OB
1 series · 13 of 25 positions shown · non-contrast
Comparison: CT of the abdomen and pelvis 02/07/2013

CLINICAL DATA: Tenderness behind the cervix. Vaginal discharge.
Pelvic pain for 6-8 weeks. Previous cholecystectomy. Pelvic
congestion syndrome surgery, tubal ligation. Gravida 5 para 3 Ab 2.

EXAM:
TRANSABDOMINAL AND TRANSVAGINAL ULTRASOUND OF PELVIS
TECHNIQUE: Both transabdominal and transvaginal ultrasound examinations of the
pelvis were performed. Transabdominal technique was performed for
global imaging of the pelvis including uterus, ovaries, adnexal
regions, and pelvic cul-de-sac. It was necessary to proceed with
endovaginal exam following the transabdominal exam to visualize the
endometrium and ovaries.

[Series 1: us transvaginal non-ob · 0.24mm/px · 13 of 110 slices shown]
[im 1/110]
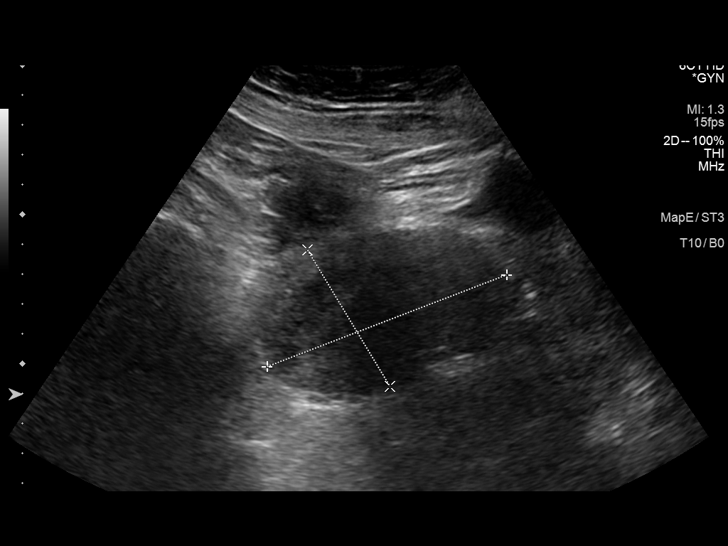
[im 10/110]
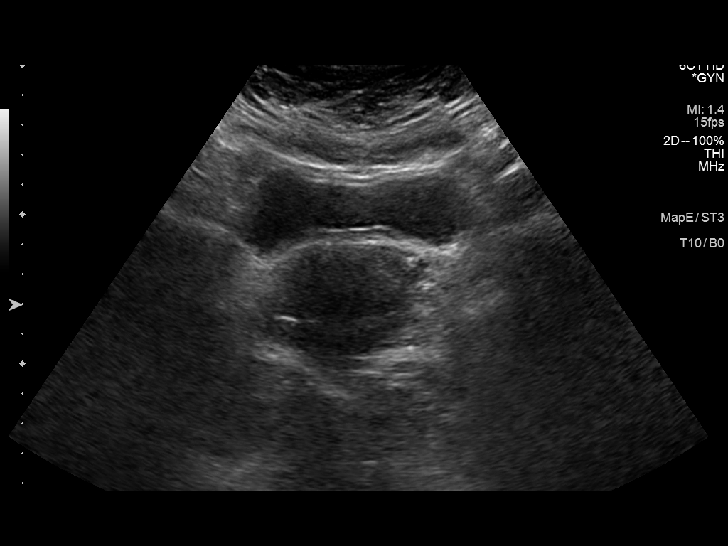
[im 19/110]
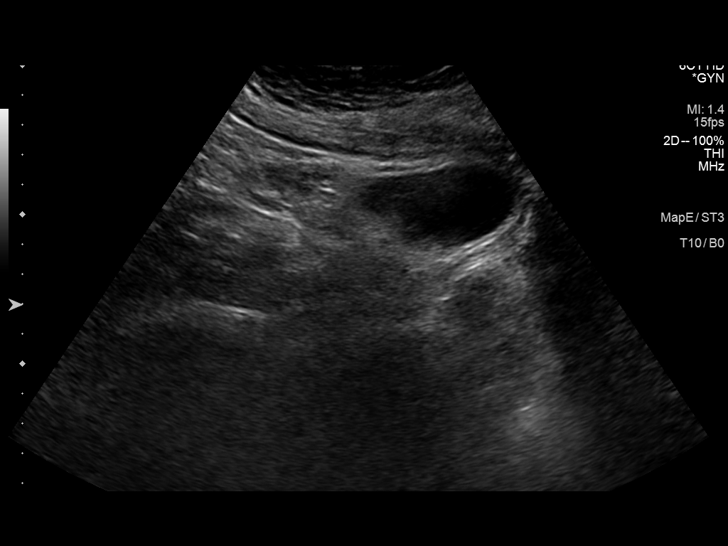
[im 28/110]
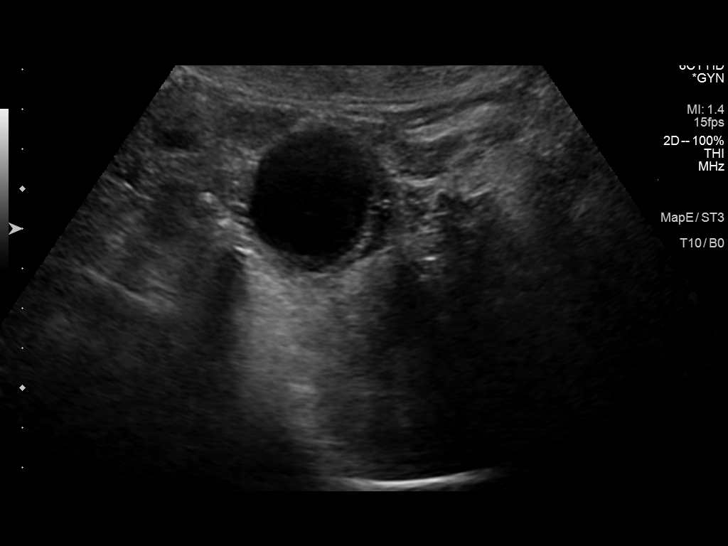
[im 37/110]
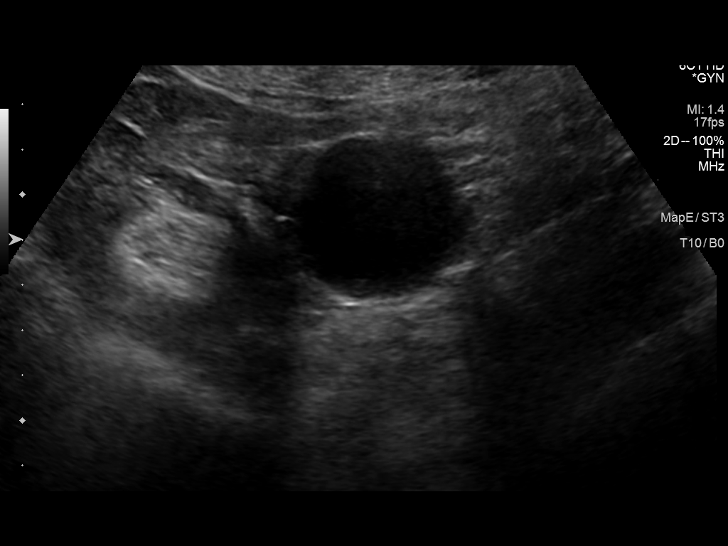
[im 46/110]
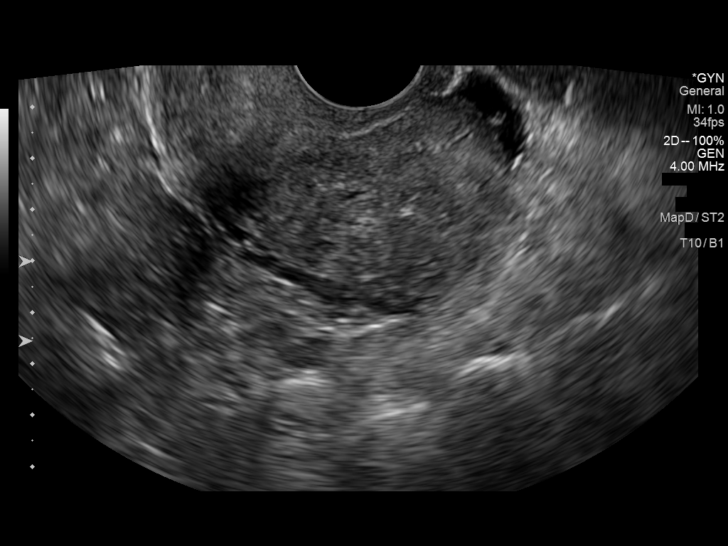
[im 55/110]
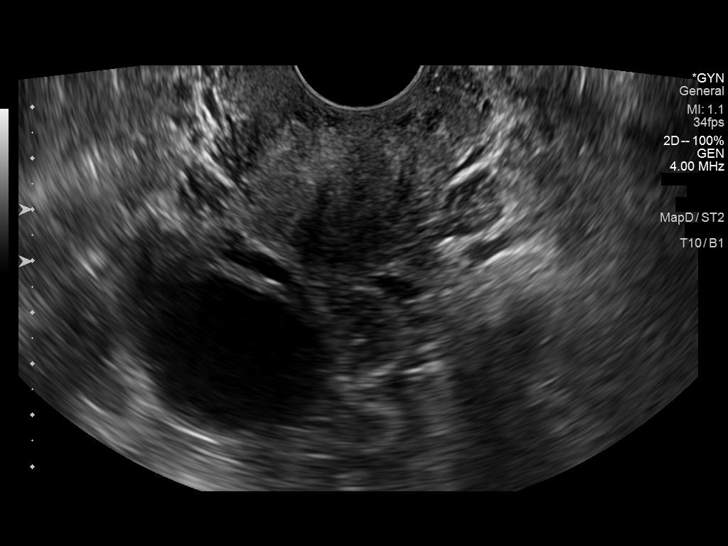
[im 64/110]
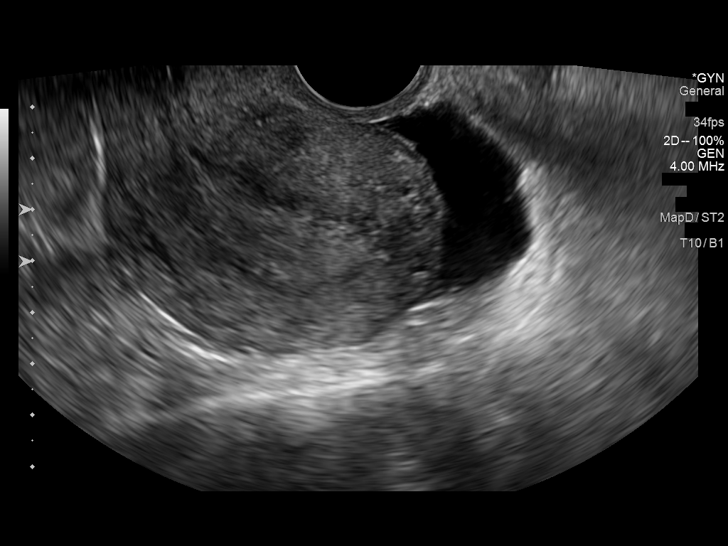
[im 73/110]
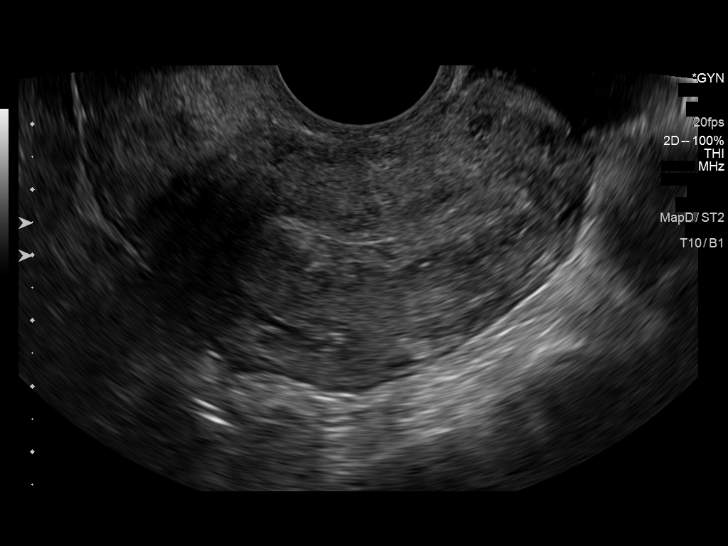
[im 82/110]
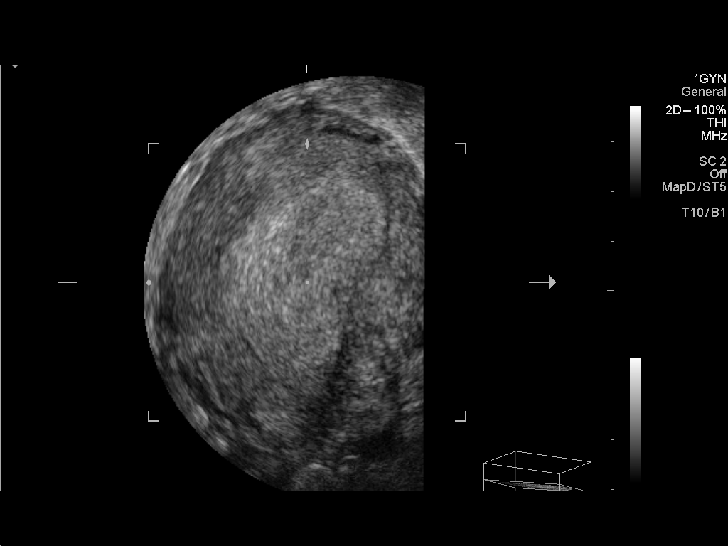
[im 91/110]
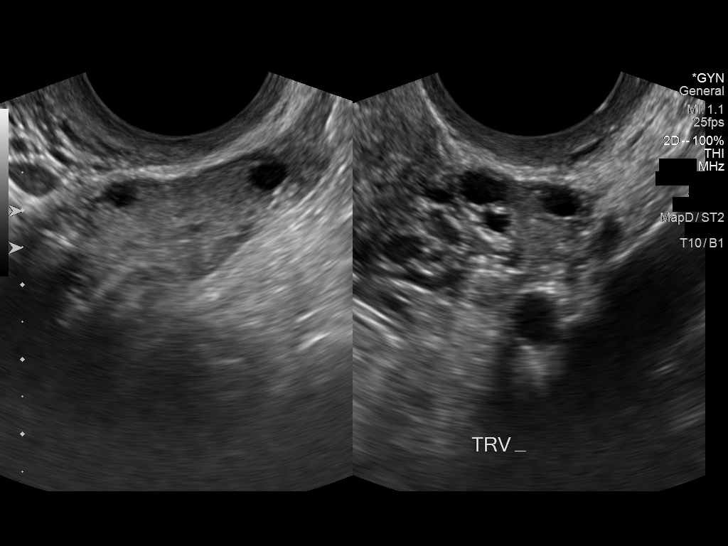
[im 100/110]
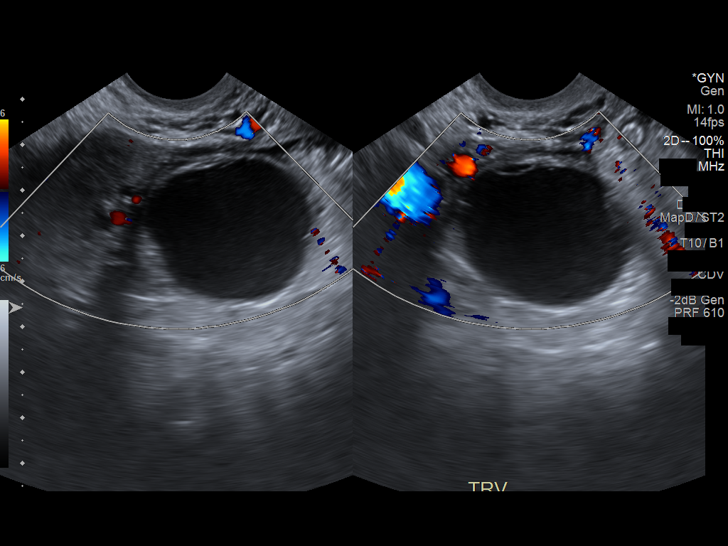
[im 110/110]
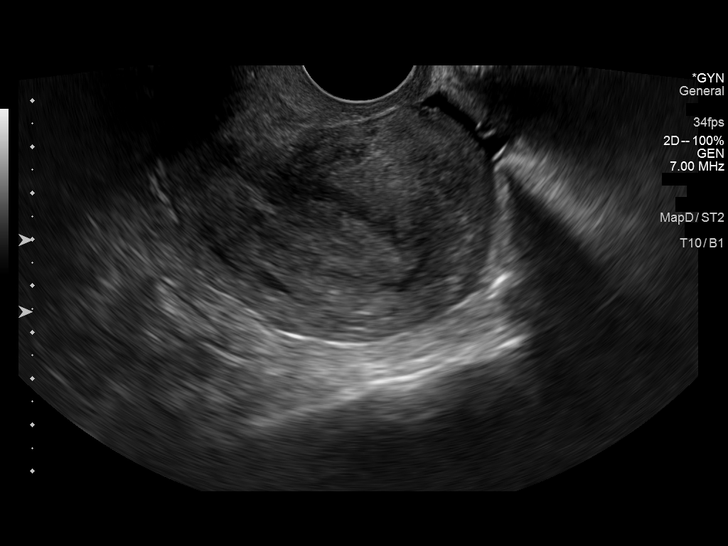

[13 of 25 positions shown; findings below may reference images not displayed]

FINDINGS: Uterus

Measurements: 10.8 x 5.2 x 5.7 cm. Left posterior fibroid is 0.9 x
0.8 x 0.8 cm

Endometrium

Thickness: 10.3 mm.  No focal abnormality visualized.

Right ovary

Measurements: 5.5 x 3.5 x 5.0 cm. Nearly anechoic cyst measures
x 3.1 x 3.7 cm. No septations or mural nodules. Blood flow is
identified within the ovary on color Doppler evaluation.

Left ovary

Measurements: 3.3 x 1.4 x 2.0 cm. Normal appearance/no adnexal mass.

Other findings

Small amount of free pelvic fluid noted.
IMPRESSION: 1. Probable small left uterine fibroid 0.9 cm.
2. Endometrial stripe normal in thickness.
3. Nearly anechoic right ovarian cyst is 3.7 cm. This is almost
certainly benign, and no specific imaging follow up is recommended
according to the Society of Radiologists in Ultrasound 2171
Consensus Conference Statement (Yoel Tiger et al. Management of
Asymptomatic Ovarian and Other Adnexal Cysts Imaged at US: Society
of Radiologists in Ultrasound Consensus Conference Statement 2171.
Radiology [DATE]): [id number].).
4. Normal appearance of the left ovary.

## 2015-07-26 ENCOUNTER — Other Ambulatory Visit: Payer: Self-pay

## 2015-07-26 ENCOUNTER — Ambulatory Visit (INDEPENDENT_AMBULATORY_CARE_PROVIDER_SITE_OTHER): Payer: BLUE CROSS/BLUE SHIELD | Admitting: Family Medicine

## 2015-07-26 ENCOUNTER — Encounter: Payer: Self-pay | Admitting: Family Medicine

## 2015-07-26 ENCOUNTER — Ambulatory Visit (HOSPITAL_COMMUNITY)
Admission: RE | Admit: 2015-07-26 | Discharge: 2015-07-26 | Disposition: A | Discharge status: Home / Self Care | Payer: BLUE CROSS/BLUE SHIELD | Source: Ambulatory Visit | Attending: Family Medicine | Admitting: Family Medicine

## 2015-07-26 VITALS — BP 117/84 | HR 100 | Temp 98.6°F | Wt 206.6 lb

## 2015-07-26 DIAGNOSIS — R1013 Epigastric pain: Secondary | ICD-10-CM

## 2015-07-26 DIAGNOSIS — K0889 Other specified disorders of teeth and supporting structures: Secondary | ICD-10-CM | POA: Insufficient documentation

## 2015-07-26 DIAGNOSIS — R0789 Other chest pain: Secondary | ICD-10-CM

## 2015-07-26 MED ORDER — AMOXICILLIN-POT CLAVULANATE 875-125 MG PO TABS: 1.0000 | ORAL_TABLET | Freq: Two times a day (BID) | ORAL | Status: DC

## 2015-07-26 MED ORDER — FLUCONAZOLE 150 MG PO TABS: 150.0000 mg | ORAL_TABLET | Freq: Once | ORAL | Status: DC

## 2015-07-26 NOTE — Patient Instructions (Signed)
Thank you so much for coming to visit me today! Please go to [facility] to have imaging, which is ordered. I have ordered a 10 day course of Augmentin, which you will take twice daily. I have also sent a prescription for Diflucan to the pharmacy to use if you get a yeast infection.  Please call your insurance company to see which dentists are covered. You may then call the dentist to see if they require you to have your insurance card. If no dentists are covered, you will need to see which dentists allow for self pay. You may also look into getting your [employer].  Please return in 1-2 week. Return sooner if symptoms worsen.  Thanks again. Dr. Gerlean Ren

## 2015-07-27 NOTE — Progress Notes (Signed)
Subjective:     Patient ID: Katelyn Brown, female   DOB: 1970/02/26, 45 y.o.   MRN: [id number]  HPI Katelyn Brown is a 45yo female presenting for right tooth pain. - Reports history of cyst in right mouth removed by surgery in 2013. States she was supposed to follow up with a dentist but has not due to insurance problems. - Continues to have insurance problems. Recently switched to [facility], but does not have her insurance card yet. - Symptoms first started Mother's Day 5/14 - Note swelling and pain in right lower teeth. Reports it feels exactly the same as - Symptoms have been improving with gargling salt water, stating swelling is going down and pain is improving - Denies problems with PO intake, chewing, drooling, breathing, speech - Denies fever  - Chart Review shows history of right upper dental infection, diagnosed with odontogenic cyst. Removed in 2013. Treated with course of Augmentin and Clindamycin prior to surgery.   Review of Systems Per HPI. Other systems negative.    Objective:   Physical Exam  Constitutional: She appears well-developed and well-nourished. No distress.  HENT:  Palpable well circumscribed lesion under right mandible. Poor dentition.  Cardiovascular: Normal rate and regular rhythm.  Exam reveals no gallop and no friction rub.   No murmur heard. Pulmonary/Chest: Effort normal. No respiratory distress. She has no wheezes.  Abdominal: Soft. She exhibits no distension.  Psychiatric: She has a normal mood and affect. Her behavior is normal.      Assessment and Plan:     1. Tooth pain - Palpable well-circumscribed lesion under right mandible, lymph node vs. scar tissue vs. cyst vs. Abscess - Discussed with Dr. Ritta Slot office. Will order Orthopantogram. - Course of Augmentin given. Diflucan also given due to reported yeast infections with antibiotics. - To follow up with dentist.  - Follow up in one week or sooner if symptoms worsen. Return precautions  discussed.

## 2015-08-03 ENCOUNTER — Telehealth: Payer: Self-pay | Admitting: Family Medicine

## 2015-08-03 NOTE — Telephone Encounter (Signed)
Contacted concerning normal results. Reports improvement in symptoms with antibiotics, but has not yet followed up with dentist. Recommend follow up with Dentist.

## 2015-08-31 ENCOUNTER — Ambulatory Visit (INDEPENDENT_AMBULATORY_CARE_PROVIDER_SITE_OTHER): Payer: BLUE CROSS/BLUE SHIELD | Admitting: Family Medicine

## 2015-08-31 ENCOUNTER — Encounter: Payer: Self-pay | Admitting: Family Medicine

## 2015-08-31 VITALS — BP 132/80 | HR 77 | Temp 98.4°F | Ht 63.5 in | Wt 204.0 lb

## 2015-08-31 DIAGNOSIS — Z0000 Encounter for general adult medical examination without abnormal findings: Secondary | ICD-10-CM | POA: Diagnosis not present

## 2015-08-31 DIAGNOSIS — Z1211 Encounter for screening for malignant neoplasm of colon: Secondary | ICD-10-CM | POA: Diagnosis not present

## 2015-08-31 NOTE — Progress Notes (Signed)
Subjective:     Patient ID: Katelyn Brown, female   DOB: [birth year], 45 y.o.   MRN: [id number]  HPI Mrs. Butka is a 45yo female presenting for general check up of tooth pain and health maintenance.  # Dental Pain: - Reports tooth pain is much improved from last visit. Has not been to dentist yet because she has been having problems with insurance covering a facility. Recently made progress over the next several days and she is hoping to be able to schedule an appointment. - Completed course of antibiotics  # Health Maintenance: - Last colonoscopy 10 years ago with internal hemorrhoids. Was not told if she should follow up or not. - Last pap smear normal in 03/2014, next due in 03/2019 - Last mammogram documented in 2015. Reports she thought she had one done in 11/2014 at [facility]. To contact [facility] to see if there is a record of this being done. If not, to go for mammogram - Requests follow up for STD screening. - Never smoker  Review of Systems Per HPI. Other systems negative.    Objective:   Physical Exam  Constitutional: She appears well-developed and well-nourished. No distress.  Cardiovascular: Normal rate and regular rhythm.   No murmur heard. Pulmonary/Chest: No respiratory distress. She has no wheezes.  Musculoskeletal: She exhibits no edema.  Skin: No rash noted.  Psychiatric: She has a normal mood and affect. Her behavior is normal.       Assessment and Plan:     Annual physical exam - Follow up with GI for colonoscopy - Next pap smear due 2021 - To check with [facility] concerning last mammogram. To have done if due. - Follow up for STD screening

## 2015-08-31 NOTE — Assessment & Plan Note (Signed)
-   Follow up with GI for colonoscopy - Next pap smear due 2021 - To check with [facility] concerning last mammogram. To have done if due. - Follow up for STD screening

## 2015-08-31 NOTE — Patient Instructions (Signed)
Thank you so much for coming to visit today! I have ordered a GI referral to check on colonoscopy. Please return as scheduled next week for additional labs.  Thanks again! Dr. Gerlean Ren

## 2015-09-04 ENCOUNTER — Ambulatory Visit (INDEPENDENT_AMBULATORY_CARE_PROVIDER_SITE_OTHER): Payer: BLUE CROSS/BLUE SHIELD | Admitting: Family Medicine

## 2015-09-04 ENCOUNTER — Other Ambulatory Visit (HOSPITAL_COMMUNITY)
Admission: RE | Admit: 2015-09-04 | Discharge: 2015-09-04 | Disposition: A | Discharge status: Home / Self Care | Payer: BLUE CROSS/BLUE SHIELD | Source: Ambulatory Visit | Attending: Family Medicine | Admitting: Family Medicine

## 2015-09-04 ENCOUNTER — Encounter: Payer: Self-pay | Admitting: Family Medicine

## 2015-09-04 VITALS — BP 130/83 | HR 84 | Temp 98.6°F | Ht 65.0 in | Wt 204.2 lb

## 2015-09-04 DIAGNOSIS — D126 Benign neoplasm of colon, unspecified: Secondary | ICD-10-CM | POA: Diagnosis present

## 2015-09-04 DIAGNOSIS — Z7251 High risk heterosexual behavior: Secondary | ICD-10-CM | POA: Diagnosis not present

## 2015-09-04 LAB — POCT WET PREP (WET MOUNT): CLUE CELLS WET PREP WHIFF POC: NEGATIVE

## 2015-09-04 NOTE — Patient Instructions (Signed)
Thank you so much for coming to visit today! We will contact you concerning the results of the labwork. Please use a condom during intercourse to help protect yourself from STDs.  Please follow up with a dentis for your dental pain.  Thanks again! Dr. Gerlean Ren

## 2015-09-05 LAB — RPR

## 2015-09-05 LAB — CERVICOVAGINAL ANCILLARY ONLY
Chlamydia: NEGATIVE
Neisseria Gonorrhea: NEGATIVE

## 2015-09-05 LAB — HIV ANTIBODY (ROUTINE TESTING W REFLEX): HIV: NONREACTIVE
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
## 2015-09-07 ENCOUNTER — Encounter: Payer: Self-pay | Admitting: Family Medicine

## 2015-09-07 ENCOUNTER — Telehealth: Payer: Self-pay | Admitting: Family Medicine

## 2015-09-07 NOTE — Telephone Encounter (Signed)
Contacted concerning results. Requests letter mailed so she can have hard copy. Will forward letter to Administration for mailing.

## 2015-11-16 ENCOUNTER — Ambulatory Visit (INDEPENDENT_AMBULATORY_CARE_PROVIDER_SITE_OTHER): Payer: BLUE CROSS/BLUE SHIELD | Admitting: Internal Medicine

## 2015-11-16 ENCOUNTER — Encounter: Payer: Self-pay | Admitting: Internal Medicine

## 2015-11-16 VITALS — BP 132/85 | HR 82 | Temp 98.4°F | Ht 65.0 in | Wt 205.0 lb

## 2015-11-16 DIAGNOSIS — M545 Low back pain, unspecified: Secondary | ICD-10-CM

## 2015-11-16 MED ORDER — NAPROXEN 500 MG PO TBEC: 500.0000 mg | DELAYED_RELEASE_TABLET | Freq: Two times a day (BID) | ORAL | 0 refills | Status: DC
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
## 2015-11-16 NOTE — Patient Instructions (Signed)
Ms. Hardnett,  Thank you for coming in today.  I have ordered x-ray of lumbar spine. You can get this at your convenience. I will call you with results.  Please try naproxen twice daily with meals over the next week or so.  Please follow-up in about 1 month to see how your back pain is doing and to discuss mood.  Best, Dr. Ola Spurr

## 2015-11-19 DIAGNOSIS — M545 Low back pain, unspecified: Secondary | ICD-10-CM

## 2015-11-19 HISTORY — DX: Low back pain, unspecified: M54.50

## 2015-11-19 NOTE — Assessment & Plan Note (Signed)
-   Mild depression per PHQ-9 score of 9 today. Seems to be situational with patient already making plans to address areas of stress in her life. Does not feel like she needs to speak with a counselor today but would consider it if she is still having trouble with mood in the future. - Recommended trying melatonin for sleep.

## 2015-11-19 NOTE — Assessment & Plan Note (Signed)
-   Acute on chronic after hitting back. Suspect pain 2/2 inflammation and improving but due to midline lumbar tenderness to palpation, ordered lumbar xray. Will call patient with results once performed.  - Prescribed naproxen for patient to try twice daily for the next couple of weeks.

## 2016-03-19 ENCOUNTER — Encounter (HOSPITAL_COMMUNITY): Payer: Self-pay

## 2016-03-19 ENCOUNTER — Emergency Department (HOSPITAL_COMMUNITY)
Admission: EM | Admit: 2016-03-19 | Discharge: 2016-03-20 | Disposition: A | Discharge status: Home / Self Care | Payer: BLUE CROSS/BLUE SHIELD | Attending: Emergency Medicine | Admitting: Emergency Medicine

## 2016-03-19 DIAGNOSIS — K047 Periapical abscess without sinus: Secondary | ICD-10-CM | POA: Insufficient documentation

## 2016-03-19 DIAGNOSIS — Z79899 Other long term (current) drug therapy: Secondary | ICD-10-CM | POA: Insufficient documentation
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
## 2016-03-20 MED ORDER — CLINDAMYCIN HCL 150 MG PO CAPS
300.0000 mg | ORAL_CAPSULE | Freq: Once | ORAL | Status: AC
  Administered 2016-03-20: 300 mg via ORAL
  Filled 2016-03-20: qty 2

## 2016-03-20 MED ORDER — OXYCODONE-ACETAMINOPHEN 5-325 MG PO TABS: 1.0000 | ORAL_TABLET | ORAL | 0 refills | Status: DC | PRN

## 2016-03-20 MED ORDER — CLINDAMYCIN HCL 300 MG PO CAPS: 300.0000 mg | ORAL_CAPSULE | Freq: Four times a day (QID) | ORAL | 0 refills | Status: DC

## 2016-03-20 NOTE — Discharge Instructions (Signed)
Apply warm compresses. Take ibuprofen and acetaminophen for less severe pain.   See your dentist at 10 AM as scheduled.  Return if the swelling is getting worse, or if you start running a fever.
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
## 2016-05-22 ENCOUNTER — Ambulatory Visit: Payer: BLUE CROSS/BLUE SHIELD | Admitting: Student

## 2016-06-06 DIAGNOSIS — D259 Leiomyoma of uterus, unspecified: Secondary | ICD-10-CM | POA: Insufficient documentation

## 2016-07-09 ENCOUNTER — Ambulatory Visit (INDEPENDENT_AMBULATORY_CARE_PROVIDER_SITE_OTHER): Payer: BLUE CROSS/BLUE SHIELD | Admitting: Family Medicine

## 2016-07-09 ENCOUNTER — Encounter: Payer: Self-pay | Admitting: Family Medicine

## 2016-07-09 VITALS — BP 110/90 | HR 76 | Temp 98.3°F | Ht 65.0 in | Wt 204.8 lb

## 2016-07-09 DIAGNOSIS — W57XXXA Bitten or stung by nonvenomous insect and other nonvenomous arthropods, initial encounter: Secondary | ICD-10-CM

## 2016-07-09 DIAGNOSIS — R21 Rash and other nonspecific skin eruption: Secondary | ICD-10-CM

## 2016-07-09 MED ORDER — HYDROCORTISONE 0.5 % EX CREA: 1.0000 "application " | TOPICAL_CREAM | Freq: Two times a day (BID) | CUTANEOUS | 0 refills | Status: DC

## 2016-07-09 MED ORDER — HYDROXYZINE HCL 10 MG PO TABS: 10.0000 mg | ORAL_TABLET | Freq: Three times a day (TID) | ORAL | 0 refills | Status: DC | PRN

## 2016-07-09 NOTE — Progress Notes (Signed)
Subjective: CC:bug bites OBS:JGGEZMO Katelyn Brown is a 46 y.o. female presenting to clinic today for same day appointment. PCP: Lorna Few, DO Concerns today include:  She reports that she recently got back from a trip in [place].  She got back last night.  Patient reports that she did not feel right her entire trip.   She reports that symptoms started about a day or two after she got there.  She report she sustained several bug bites on her legs and has a rash on her back.   Sh describes the insects as "black chiggers".  She notes that the lesions are no longer itchy, just dark.  Though she does note she took Benadryl which helped.  She reports nausea and fever that resolved.  No diarrhea.  She reports constipation the entire time.  Her cousin was sick with similar illness but had vomiting as well.  She reports that her symptoms were relieved benadryl and rest.  She reports arthralgia that resolved when subjective fever and nausea resolved.  She thinks that her symptoms may have started after the bug bites but she is not sure. She does note that she drank a lot of ETOH and ate local foods while she was there.  She also reports snorkeling in murky, smelly waters.  No Known Allergies  Social Hx reviewed. MedHx, current medications and allergies reviewed.  Please see EMR. ROS: Per HPI  Objective: Office vital signs reviewed. BP 110/90   Pulse 76   Temp 98.3 F (36.8 C) (Oral)   Ht 5\' 5"  (1.651 m)   Wt 204 lb 12.8 oz (92.9 kg)   LMP 06/23/2016 (Exact Date)   SpO2 99%   BMI 34.08 kg/m   Physical Examination:  General: Awake, alert, well nourished, No acute distress HEENT: sclera white, EOMI, MMM and without ulceration Cardio: regular rate and rhythm, S1S2 heard, no murmurs appreciated Pulm: clear to auscultation bilaterally, no wheezes, rhonchi or rales; normal work of breathing on room air Skin: nonerythematous maculopapular rash along the shoulders and neck.  Area is nontender.   No bleeding or exudate seen. Along bilateral LE and arms are few hyperpigmented flat lesions consistent with post inflammatory hyperpigmentation.  No exudate or bleeding from lesions.  Assessment/ Plan: 46 y.o. female   1. Insect bite, initial encounter.  Insect bites do not appear infected.  They in fact seem to be resolving.  The insect she describes do not sound consistent with the "kissing bug".  Additionally, onset of symptoms not consistent with Chagas.   Though if concern for Chagas increases, would consider obtaining PCR.   Zika virus also considered.  Though no conjunctivitis and symptoms have resolved.  Could consider rRT-PCR of serum if symptoms return.  Malaria also considered, but again not physical findings to support this and her symptoms have resolved.  I wonder if her symptoms were not related to jet lag or a viral illness contracted prior to the trip.  - hydrocortisone cream 0.5 %; Apply 1 application topically 2 (two) times daily.  Dispense: 30 g; Refill: 0 - hydrOXYzine (ATARAX/VISTARIL) 10 MG tablet; Take 1 tablet (10 mg total) by mouth 3 (three) times daily as needed.  Dispense: 30 tablet; Refill: 0 - return precautions reviewed.  2. Rash and nonspecific skin eruption.  Localized to neck and shoulders.  Seems more contact dermatitis in appearance. - topical steroid and antihistamine as above  - return precautions reviewed.   Janora Norlander, DO PGY-3, [facility]

## 2016-07-23 ENCOUNTER — Ambulatory Visit (INDEPENDENT_AMBULATORY_CARE_PROVIDER_SITE_OTHER): Payer: BLUE CROSS/BLUE SHIELD | Admitting: Family Medicine

## 2016-07-23 ENCOUNTER — Encounter: Payer: Self-pay | Admitting: Family Medicine

## 2016-07-23 VITALS — BP 104/70 | HR 90 | Temp 98.2°F | Ht 64.75 in | Wt 203.0 lb

## 2016-07-23 DIAGNOSIS — F419 Anxiety disorder, unspecified: Secondary | ICD-10-CM | POA: Diagnosis not present

## 2016-07-23 DIAGNOSIS — R590 Localized enlarged lymph nodes: Secondary | ICD-10-CM

## 2016-07-23 DIAGNOSIS — E785 Hyperlipidemia, unspecified: Secondary | ICD-10-CM

## 2016-07-23 DIAGNOSIS — D649 Anemia, unspecified: Secondary | ICD-10-CM

## 2016-07-23 DIAGNOSIS — N920 Excessive and frequent menstruation with regular cycle: Secondary | ICD-10-CM | POA: Diagnosis not present

## 2016-07-23 DIAGNOSIS — F329 Major depressive disorder, single episode, unspecified: Secondary | ICD-10-CM

## 2016-07-23 DIAGNOSIS — R079 Chest pain, unspecified: Secondary | ICD-10-CM | POA: Diagnosis not present

## 2016-07-23 DIAGNOSIS — F32A Depression, unspecified: Secondary | ICD-10-CM

## 2016-07-23 DIAGNOSIS — D72829 Elevated white blood cell count, unspecified: Secondary | ICD-10-CM | POA: Diagnosis not present

## 2016-07-23 DIAGNOSIS — R0982 Postnasal drip: Secondary | ICD-10-CM

## 2016-07-23 MED ORDER — FLUCONAZOLE 150 MG PO TABS: 150.0000 mg | ORAL_TABLET | Freq: Every day | ORAL | 0 refills | Status: DC

## 2016-07-23 MED ORDER — AMOXICILLIN 500 MG PO CAPS: 500.0000 mg | ORAL_CAPSULE | Freq: Two times a day (BID) | ORAL | 0 refills | Status: DC
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
## 2016-07-23 NOTE — Patient Instructions (Addendum)
BEFORE YOU LEAVE: -follow up: 30 minute in 1-2 months -labs -xray sheet  Go get the chest xray.  Call to schedule Ear, Nose and Throat evaluation in 2-4 weeks to ensure lymph nodes resolved.  Call to schedule gynecology exam.  Call to schedule therapy for the stress and anxiety.  Take the amoxicillin as prescribed. Use the yeast pills (diflucan) if needed.  Start flonase 2 sprays each nostril daily and claritin once daily for the allergies and lymph nodes  We placed a referral for you as discussed for the stress test for the heart. It usually takes about 1-2 weeks to process and schedule this referral. If you have not heard from [place] regarding this appointment in 2 weeks please contact our office.

## 2016-07-24 LAB — BASIC METABOLIC PANEL
BUN: 10 mg/dL (ref 6–23)
CHLORIDE: 107 meq/L (ref 96–112)
CO2: 25 mEq/L (ref 19–32)
Calcium: 9.3 mg/dL (ref 8.4–10.5)
Creatinine, Ser: 0.69 mg/dL (ref 0.40–1.20)
GFR: 117.07 mL/min (ref >60.00)
Glucose, Bld: 85 mg/dL (ref 70–99)
POTASSIUM: 3.9 meq/L (ref 3.5–5.1)
SODIUM: 139 meq/L (ref 135–145)

## 2016-07-24 LAB — TSH: TSH: 1.29 u[IU]/mL (ref 0.35–4.50)

## 2016-07-24 LAB — HEMOGLOBIN A1C: Hgb A1c MFr Bld: 5.6 % (ref 4.6–6.5)

## 2016-07-24 LAB — CBC
HEMATOCRIT: 39 % (ref 36.0–46.0)
Hemoglobin: 12.8 g/dL (ref 12.0–15.0)
MCHC: 32.9 g/dL (ref 30.0–36.0)
MCV: 89.1 fl (ref 78.0–100.0)
PLATELETS: 334 10*3/uL (ref 150.0–400.0)
RBC: 4.38 Mil/uL (ref 3.87–5.11)
RDW: 14.3 % (ref 11.5–15.5)
WBC: 11.5 10*3/uL — AB (ref 4.0–10.5)

## 2016-07-24 LAB — HDL CHOLESTEROL: HDL: 34.8 mg/dL — AB (ref >39.00)

## 2016-07-24 LAB — CHOLESTEROL, TOTAL: CHOLESTEROL: 204 mg/dL — AB (ref 0–200)

## 2016-07-26 ENCOUNTER — Ambulatory Visit (INDEPENDENT_AMBULATORY_CARE_PROVIDER_SITE_OTHER)
Admission: RE | Admit: 2016-07-26 | Discharge: 2016-07-26 | Disposition: A | Discharge status: Home / Self Care | Payer: BLUE CROSS/BLUE SHIELD | Source: Ambulatory Visit | Attending: Family Medicine | Admitting: Family Medicine

## 2016-07-26 DIAGNOSIS — R079 Chest pain, unspecified: Secondary | ICD-10-CM | POA: Diagnosis not present

## 2016-07-29 NOTE — Addendum Note (Signed)
Addended by: Agnes Lawrence on: 07/29/2016 10:17 AM   Modules accepted: Orders

## 2016-08-02 ENCOUNTER — Other Ambulatory Visit: Payer: Self-pay | Admitting: Otolaryngology

## 2016-08-02 DIAGNOSIS — K1120 Sialoadenitis, unspecified: Secondary | ICD-10-CM

## 2016-08-06 ENCOUNTER — Ambulatory Visit
Admission: RE | Admit: 2016-08-06 | Discharge: 2016-08-06 | Disposition: A | Discharge status: Home / Self Care | Payer: BLUE CROSS/BLUE SHIELD | Source: Ambulatory Visit | Attending: Otolaryngology | Admitting: Otolaryngology

## 2016-08-06 DIAGNOSIS — K1120 Sialoadenitis, unspecified: Secondary | ICD-10-CM

## 2016-08-06 MED ORDER — IOPAMIDOL (ISOVUE-300) INJECTION 61%
75.0000 mL | Freq: Once | INTRAVENOUS | Status: AC | PRN
  Administered 2016-08-06: 75 mL via INTRAVENOUS

## 2016-09-02 ENCOUNTER — Ambulatory Visit (INDEPENDENT_AMBULATORY_CARE_PROVIDER_SITE_OTHER): Payer: BLUE CROSS/BLUE SHIELD | Admitting: Family Medicine

## 2016-09-02 ENCOUNTER — Encounter: Payer: Self-pay | Admitting: Family Medicine

## 2016-09-02 VITALS — BP 100/78 | HR 82 | Temp 99.0°F | Ht 64.75 in | Wt 198.3 lb

## 2016-09-02 DIAGNOSIS — F411 Generalized anxiety disorder: Secondary | ICD-10-CM

## 2016-09-02 DIAGNOSIS — R079 Chest pain, unspecified: Secondary | ICD-10-CM | POA: Diagnosis not present

## 2016-09-02 DIAGNOSIS — Z6833 Body mass index (BMI) 33.0-33.9, adult: Secondary | ICD-10-CM | POA: Diagnosis not present

## 2016-09-02 DIAGNOSIS — F330 Major depressive disorder, recurrent, mild: Secondary | ICD-10-CM

## 2016-09-02 NOTE — Patient Instructions (Addendum)
BEFORE YOU LEAVE: -follow up: CPE in 3-4 months -check on status of heart stress test and update patient  Call today to set up counseling - call number provided.   We recommend the following healthy lifestyle for LIFE: 1) Small portions. Regular meals.  2) Eat a healthy clean diet.   TRY TO EAT: -at least 5-7 servings of low sugar vegetables per day (not corn, potatoes or bananas.) -berries are the best choice if you wish to eat fruit.   -lean meets (fish, chicken or [place] breasts) -vegan proteins for some meals - beans or tofu, whole grains, nuts and seeds -Replace bad fats with good fats - good fats include: fish, nuts and seeds, canola oil, olive oil -small amounts of low fat or non fat dairy -small amounts of100 % whole grains - check the lables  AVOID: -SUGAR, sweets, anything with added sugar, corn syrup or sweeteners -if you must have a sweetener, small amounts of stevia may be best -sweetened beverages -simple starches (rice, bread, potatoes, pasta, chips, etc - small amounts of 100% whole grains are ok) -red meat, pork, butter -fried foods, fast food, processed food, excessive dairy, eggs and coconut.  3)Get at least 150 minutes of sweaty aerobic exercise per week.  4)Reduce stress - consider counseling, meditation and relaxation to balance other aspects of your life.
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
## 2016-09-18 ENCOUNTER — Ambulatory Visit: Payer: BLUE CROSS/BLUE SHIELD

## 2016-09-27 ENCOUNTER — Ambulatory Visit (INDEPENDENT_AMBULATORY_CARE_PROVIDER_SITE_OTHER): Payer: BLUE CROSS/BLUE SHIELD

## 2016-09-27 DIAGNOSIS — R079 Chest pain, unspecified: Secondary | ICD-10-CM | POA: Diagnosis not present

## 2016-09-30 LAB — EXERCISE TOLERANCE TEST
CHL RATE OF PERCEIVED EXERTION: 15
CSEPEW: 8.5 METS
Exercise duration (min): 7 min
Exercise duration (sec): 0 s
MPHR: 17 {beats}/min
Peak HR: 162 {beats}/min
Percent HR: 93 %
Rest HR: 82 {beats}/min

## 2016-11-28 ENCOUNTER — Other Ambulatory Visit (HOSPITAL_COMMUNITY): Payer: Self-pay | Admitting: Interventional Radiology

## 2016-11-28 DIAGNOSIS — N9489 Other specified conditions associated with female genital organs and menstrual cycle: Secondary | ICD-10-CM

## 2016-12-17 ENCOUNTER — Other Ambulatory Visit: Payer: BLUE CROSS/BLUE SHIELD

## 2017-02-18 HISTORY — PX: LIPOSUCTION: SHX10

## 2017-03-04 ENCOUNTER — Ambulatory Visit: Payer: Self-pay

## 2017-03-04 NOTE — Telephone Encounter (Signed)
Patient called in with c/o "dizziness and feeling depressed." She states "I've been dealing with the dizziness for a few months and I can't take the medicine when I go to work because it makes me sleepy." I asked what is the name of the medicine and who prescribed it. She said "I think it is meclizine. I went to the ED for it years ago and was told I had vertigo. I can't read what the medicine is on the bottle, but I know what it looks like." I asked is she able to walk, she says "when I get up, I sit for a while then get up. I have to hold on to something. I also have ringing in my right ear if there are loud noises or if someone talks loud. This isn't all the time though."  Based on the tone of her voice, I asked was she depressed. She said "Yes, because I work 2 jobs and struggle more. My kids are grown and I'm struggling more than I did when they were here." I asked is she having any thoughts of harming herself, she said "no, definitely not." I asked if she's having other symptoms with the dizziness, she said yes-headache, numbness/swelling of my hands/feet in the morning, nausea in the morning, cramps in my calf's in the morning. According to the protocol, see PCP in 24 hours, appointment made for tomorrow, care advice given, verbalized understanding.  Reason for Disposition . [1] MODERATE dizziness (e.g., vertigo; feels very unsteady, interferes with normal activities) AND [2] has NOT been evaluated by physician for this  Answer Assessment - Initial Assessment Questions 1. DESCRIPTION: "Describe your dizziness."     Stand up feel lightheaded 2. VERTIGO: "Do you feel like either you or the room is spinning or tilting?"      Yes at times when I'm lying down 3. LIGHTHEADED: "Do you feel lightheaded?" (e.g., somewhat faint, woozy, weak upon standing)     Yes 4. SEVERITY: "How bad is it?"  "Can you walk?"   - MILD - Feels unsteady but walking normally.   - MODERATE - Feels very unsteady when walking,  but not falling; interferes with normal activities (e.g., school, work) .   - SEVERE - Unable to walk without falling (requires assistance).     Moderate 5. ONSET:  "When did the dizziness begin?"     Few months ago 6. AGGRAVATING FACTORS: "Does anything make it worse?" (e.g., standing, change in head position)     Standing 7. CAUSE: "What do you think is causing the dizziness?"     Maybe because of exhaustion and tired 8. RECURRENT SYMPTOM: "Have you had dizziness before?" If so, ask: "When was the last time?" "What happened that time?"     Yes-maybe 5-6 years ago. I was so dizzy, head hurting, lightheaded 9. OTHER SYMPTOMS: "Do you have any other symptoms?" (e.g., headache, weakness, numbness, vomiting, earache)     Headache, right ear rings with loud noises, nausea, both hands numb every morning, feet and hands swollen every morning, cramps in calf's in the morning 10. PREGNANCY: "Is there any chance you are pregnant?" "When was your last menstrual period?"       No-tubal ligation  Protocols used: DIZZINESS - VERTIGO-A-AH

## 2017-03-05 ENCOUNTER — Encounter: Payer: Self-pay | Admitting: Family Medicine

## 2017-03-05 ENCOUNTER — Ambulatory Visit: Payer: BLUE CROSS/BLUE SHIELD | Admitting: Family Medicine

## 2017-03-05 VITALS — BP 120/90 | HR 83 | Temp 99.3°F

## 2017-03-05 DIAGNOSIS — R42 Dizziness and giddiness: Secondary | ICD-10-CM | POA: Diagnosis not present

## 2017-03-05 DIAGNOSIS — F321 Major depressive disorder, single episode, moderate: Secondary | ICD-10-CM | POA: Diagnosis not present

## 2017-03-05 MED ORDER — MECLIZINE HCL 25 MG PO TABS: 25.0000 mg | ORAL_TABLET | Freq: Three times a day (TID) | ORAL | 0 refills | Status: DC | PRN

## 2017-03-05 MED ORDER — ESCITALOPRAM OXALATE 10 MG PO TABS: 10.0000 mg | ORAL_TABLET | Freq: Every day | ORAL | 1 refills | Status: DC
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
## 2017-03-05 NOTE — Telephone Encounter (Signed)
Pt scheduled to be seen today

## 2017-03-05 NOTE — Patient Instructions (Addendum)
Dizziness Dizziness is a common problem. It makes you feel unsteady or light-headed. You may feel like you are about to pass out (faint). Dizziness can lead to getting hurt if you stumble or fall. Dizziness can be caused by many things, including:  Medicines.  Not having enough water in your body (dehydration).  Illness.  Follow these instructions at home: Eating and drinking  Drink enough fluid to keep your pee (urine) clear or pale yellow. This helps to keep you from getting dehydrated. Try to drink more clear fluids, such as water.  Do not drink alcohol.  Limit how much caffeine you drink or eat, if your doctor tells you to do that.  Limit how much salt (sodium) you drink or eat, if your doctor tells you to do that. Activity  Avoid making quick movements. ? When you stand up from sitting in a chair, steady yourself until you feel okay. ? In the morning, first sit up on the side of the bed. When you feel okay, stand slowly while you hold onto something. Do this until you know that your balance is fine.  If you need to stand in one place for a long time, move your legs often. Tighten and relax the muscles in your legs while you are standing.  Do not drive or use heavy machinery if you feel dizzy.  Avoid bending down if you feel dizzy. Place items in your home so you can reach them easily without leaning over. Lifestyle  Do not use any products that contain nicotine or tobacco, such as cigarettes and e-cigarettes. If you need help quitting, ask your doctor.  Try to lower your stress level. You can do this by using methods such as yoga or meditation. Talk with your doctor if you need help. General instructions  Watch your dizziness for any changes.  Take over-the-counter and prescription medicines only as told by your doctor. Talk with your doctor if you think that you are dizzy because of a medicine that you are taking.  Tell a friend or a family member that you are feeling  dizzy. If he or she notices any changes in your behavior, have this person call your doctor.  Keep all follow-up visits as told by your doctor. This is important. Contact a doctor if:  Your dizziness does not go away.  Your dizziness or light-headedness gets worse.  You feel sick to your stomach (nauseous).  You have trouble hearing.  You have new symptoms.  You are unsteady on your feet.  You feel like the room is spinning. Get help right away if:  You throw up (vomit) or have watery poop (diarrhea), and you cannot eat or drink anything.  You have trouble: ? Talking. ? Walking. ? Swallowing. ? Using your arms, hands, or legs.  You feel generally weak.  You are not thinking clearly, or you have trouble forming sentences. A friend or family member may notice this.  You have: ? Chest pain. ? Pain in your belly (abdomen). ? Shortness of breath. ? Sweating.  Your vision changes.  You are bleeding.  You have a very bad headache.  You have neck pain or a stiff neck.  You have a fever. These symptoms may be an emergency. Do not wait to see if the symptoms will go away. Get medical help right away. Call your local emergency services (911 in the [place] Do not drive yourself to the hospital. Summary  Dizziness makes you feel unsteady or light-headed. You may  feel like you are about to pass out (faint).  Drink enough fluid to keep your pee (urine) clear or pale yellow. Do not drink alcohol.  Avoid making quick movements if you feel dizzy.  Watch your dizziness for any changes. This information is not intended to replace advice given to you by your health care provider. Make sure you discuss any questions you have with your health care provider. Document Released: 01/24/2011 Document Revised: 02/22/2016 Document Reviewed: 02/22/2016 Elsevier Interactive Patient Education  2017 [place] With Depression Everyone experiences occasional disappointment,  sadness, and loss in their lives. When you are feeling down, blue, or sad for at least 2 weeks in a row, it may mean that you have depression. Depression can affect your thoughts and feelings, relationships, daily activities, and physical health. It is caused by changes in the way your brain functions. If you receive a diagnosis of depression, your health care provider will tell you which type of depression you have and what treatment options are available to you. If you are living with depression, there are ways to help you recover from it and also ways to prevent it from coming back. How to cope with lifestyle changes Coping with stress Stress is your body's reaction to life changes and events, both good and bad. Stressful situations may include:  Getting married.  The death of a spouse.  Losing a job.  Retiring.  Having a baby.  Stress can last just a few hours or it can be ongoing. Stress can play a major role in depression, so it is important to learn both how to cope with stress and how to think about it differently. Talk with your health care provider or a counselor if you would like to learn more about stress reduction. He or she may suggest some stress reduction techniques, such as:  Music therapy. This can include creating music or listening to music. Choose music that you enjoy and that inspires you.  Mindfulness-based meditation. This kind of meditation can be done while sitting or walking. It involves being aware of your normal breaths, rather than trying to control your breathing.  Centering prayer. This is a kind of meditation that involves focusing on a spiritual word or phrase. Choose a word, phrase, or sacred image that is meaningful to you and that brings you peace.  Deep breathing. To do this, expand your stomach and inhale slowly through your nose. Hold your breath for 3-5 seconds, then exhale slowly, allowing your stomach muscles to relax.  Muscle relaxation. This  involves intentionally tensing muscles then relaxing them.  Choose a stress reduction technique that fits your lifestyle and personality. Stress reduction techniques take time and practice to develop. Set aside 5-15 minutes a day to do them. Therapists can offer training in these techniques. The training may be covered by some insurance plans. Other things you can do to manage stress include:  Keeping a stress diary. This can help you learn what triggers your stress and ways to control your response.  Understanding what your limits are and saying no to requests or events that lead to a schedule that is too full.  Thinking about how you respond to certain situations. You may not be able to control everything, but you can control how you react.  Adding humor to your life by watching funny films or TV shows.  Making time for activities that help you relax and not feeling guilty about spending your time this way.  Medicines  Your health care provider may suggest certain medicines if he or she feels that they will help improve your condition. Avoid using alcohol and other substances that may prevent your medicines from working properly (may interact). It is also important to:  Talk with your pharmacist or health care provider about all the medicines that you take, their possible side effects, and what medicines are safe to take together.  Make it your goal to take part in all treatment decisions (shared decision-making). This includes giving input on the side effects of medicines. It is best if shared decision-making with your health care provider is part of your total treatment plan.  If your health care provider prescribes a medicine, you may not notice the full benefits of it for 4-8 weeks. Most people who are treated for depression need to be on medicine for at least 6-12 months after they feel better. If you are taking medicines as part of your treatment, do not stop taking medicines without  first talking to your health care provider. You may need to have the medicine slowly decreased (tapered) over time to decrease the risk of harmful side effects. Relationships Your health care provider may suggest family therapy along with individual therapy and drug therapy. While there may not be family problems that are causing you to feel depressed, it is still important to make sure your family learns as much as they can about your mental health. Having your family's support can help make your treatment successful. How to recognize changes in your condition Everyone has a different response to treatment for depression. Recovery from major depression happens when you have not had signs of major depression for two months. This may mean that you will start to:  Have more interest in doing activities.  Feel less hopeless than you did 2 months ago.  Have more energy.  Overeat less often, or have better or improving appetite.  Have better concentration.  Your health care provider will work with you to decide the next steps in your recovery. It is also important to recognize when your condition is getting worse. Watch for these signs:  Having fatigue or low energy.  Eating too much or too little.  Sleeping too much or too little.  Feeling restless, agitated, or hopeless.  Having trouble concentrating or making decisions.  Having unexplained physical complaints.  Feeling irritable, angry, or aggressive.  Get help as soon as you or your family members notice these symptoms coming back. How to get support and help from others How to talk with friends and family members about your condition Talking to friends and family members about your condition can provide you with one way to get support and guidance. Reach out to trusted friends or family members, explain your symptoms to them, and let them know that you are working with a health care provider to treat your depression. Financial  resources Not all insurance plans cover mental health care, so it is important to check with your insurance carrier. If paying for co-pays or counseling services is a problem, search for a local or county mental health care center. They may be able to offer public mental health care services at low or no cost when you are not able to see a private health care provider. If you are taking medicine for depression, you may be able to get the generic form, which may be less expensive. Some makers of prescription medicines also offer help to patients who cannot afford the medicines they  need. Follow these instructions at home:  Get the right amount and quality of sleep.  Cut down on using caffeine, tobacco, alcohol, and other potentially harmful substances.  Try to exercise, such as walking or lifting small weights.  Take over-the-counter and prescription medicines only as told by your health care provider.  Eat a healthy diet that includes plenty of vegetables, fruits, whole grains, low-fat dairy products, and lean protein. Do not eat a lot of foods that are high in solid fats, added sugars, or salt.  Keep all follow-up visits as told by your health care provider. This is important. Contact a health care provider if:  You stop taking your antidepressant medicines, and you have any of these symptoms: ? Nausea. ? Headache. ? Feeling lightheaded. ? Chills and body aches. ? Not being able to sleep (insomnia).  You or your friends and family think your depression is getting worse. Get help right away if:  You have thoughts of hurting yourself or others. If you ever feel like you may hurt yourself or others, or have thoughts about taking your own life, get help right away. You can go to your nearest emergency department or call:  Your local emergency services (911 in the [place]  A suicide crisis helpline, such as the [facility] at 276-448-1277. This is open 24-hours  a day.  Summary  If you are living with depression, there are ways to help you recover from it and also ways to prevent it from coming back.  Work with your health care team to create a management plan that includes counseling, stress management techniques, and healthy lifestyle habits. This information is not intended to replace advice given to you by your health care provider. Make sure you discuss any questions you have with your health care provider. Document Released: 01/08/2016 Document Revised: 01/08/2016 Document Reviewed: 01/08/2016 Elsevier Interactive Patient Education  [employer].

## 2017-03-17 ENCOUNTER — Ambulatory Visit: Payer: BLUE CROSS/BLUE SHIELD | Admitting: Family Medicine

## 2017-03-17 DIAGNOSIS — Z0289 Encounter for other administrative examinations: Secondary | ICD-10-CM

## 2017-05-05 ENCOUNTER — Emergency Department (HOSPITAL_COMMUNITY)
Admission: EM | Admit: 2017-05-05 | Discharge: 2017-05-05 | Disposition: A | Discharge status: Home / Self Care | Payer: BLUE CROSS/BLUE SHIELD | Attending: Emergency Medicine | Admitting: Emergency Medicine

## 2017-05-05 ENCOUNTER — Encounter (HOSPITAL_COMMUNITY): Payer: Self-pay | Admitting: *Deleted

## 2017-05-05 ENCOUNTER — Other Ambulatory Visit: Payer: Self-pay

## 2017-05-05 DIAGNOSIS — L509 Urticaria, unspecified: Secondary | ICD-10-CM | POA: Diagnosis present

## 2017-05-05 MED ORDER — FAMOTIDINE 20 MG PO TABS: 20.0000 mg | ORAL_TABLET | Freq: Two times a day (BID) | ORAL | 0 refills | Status: DC

## 2017-05-05 MED ORDER — PREDNISONE 20 MG PO TABS
60.0000 mg | ORAL_TABLET | Freq: Once | ORAL | Status: AC
  Administered 2017-05-05: 60 mg via ORAL
  Filled 2017-05-05: qty 3

## 2017-05-05 MED ORDER — PREDNISONE 10 MG (21) PO TBPK: ORAL_TABLET | Freq: Every day | ORAL | 0 refills | Status: DC

## 2017-05-05 NOTE — ED Triage Notes (Signed)
The pt has been having hives for 2 days  She has been takikng benadryl but it is not getting better.  Her last benadryl 50 mg was  approx 40 minutes ago  No difficulty breathing  Hx of allergic reactions unknown cause

## 2017-05-05 NOTE — ED Notes (Signed)
The pt took benadryl  50 mg s  40 minutes ago

## 2017-05-05 NOTE — Discharge Instructions (Signed)
You were seen in the emergency department today for a rash. Continue to take Benadryl per over the counter bottle dosing instructions.  We have given you 2 prescriptions today: -Pepcid-this is an antihistamine to try and help with itching and the rash, please take this in addition to the Benadryl you were previously taking. -Prednisone this is a steroid to help with the rash as well.  These are new medications, be sure to discuss these with your pharmacist including her interactions with her medications as well as her side effects.  Follow-up with your primary care provider in 5 days for reevaluation.  Return to the emergency department for new or worsening symptoms including but not limited to difficulty breathing, facial swelling, swelling of your lips, or any other concerns that she may have.
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
## 2017-07-28 ENCOUNTER — Ambulatory Visit: Payer: BLUE CROSS/BLUE SHIELD | Admitting: Family Medicine

## 2017-07-28 ENCOUNTER — Encounter: Payer: Self-pay | Admitting: Family Medicine

## 2017-07-28 VITALS — BP 118/84 | HR 92 | Temp 98.3°F | Ht 63.0 in | Wt 207.8 lb

## 2017-07-28 DIAGNOSIS — R350 Frequency of micturition: Secondary | ICD-10-CM

## 2017-07-28 LAB — POCT URINALYSIS DIPSTICK
Bilirubin, UA: NEGATIVE
Glucose, UA: NEGATIVE
Ketones, UA: NEGATIVE
NITRITE UA: NEGATIVE
PROTEIN UA: NEGATIVE
SPEC GRAV UA: 1.02 (ref 1.010–1.025)
Urobilinogen, UA: 0.2 E.U./dL
pH, UA: 6 (ref 5.0–8.0)

## 2017-07-28 MED ORDER — NITROFURANTOIN MONOHYD MACRO 100 MG PO CAPS: 100.0000 mg | ORAL_CAPSULE | Freq: Two times a day (BID) | ORAL | 0 refills | Status: DC

## 2017-07-28 NOTE — Progress Notes (Signed)
macrobid

## 2017-07-28 NOTE — Patient Instructions (Signed)
BEFORE YOU LEAVE: -udip --> Dr. Maudie Mercury -low back exercises -follow up: CPE with pap in 1 month  Do the exercises 4 days per week. Schedule OMT (30 minutes evaluation) for the back pain If persists in 2 weeks.
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
## 2017-07-29 ENCOUNTER — Emergency Department (HOSPITAL_COMMUNITY)
Admission: EM | Admit: 2017-07-29 | Discharge: 2017-07-29 | Disposition: A | Discharge status: Home / Self Care | Payer: BLUE CROSS/BLUE SHIELD | Attending: Emergency Medicine | Admitting: Emergency Medicine

## 2017-07-29 ENCOUNTER — Ambulatory Visit: Payer: Self-pay | Admitting: *Deleted

## 2017-07-29 ENCOUNTER — Encounter (HOSPITAL_COMMUNITY): Payer: Self-pay | Admitting: Emergency Medicine

## 2017-07-29 ENCOUNTER — Other Ambulatory Visit: Payer: Self-pay

## 2017-07-29 DIAGNOSIS — Z79899 Other long term (current) drug therapy: Secondary | ICD-10-CM | POA: Diagnosis not present

## 2017-07-29 DIAGNOSIS — T7840XA Allergy, unspecified, initial encounter: Secondary | ICD-10-CM | POA: Diagnosis not present

## 2017-07-29 DIAGNOSIS — L509 Urticaria, unspecified: Secondary | ICD-10-CM | POA: Diagnosis present

## 2017-07-29 MED ORDER — PREDNISONE 20 MG PO TABS
60.0000 mg | ORAL_TABLET | Freq: Once | ORAL | Status: AC
  Administered 2017-07-29: 60 mg via ORAL
  Filled 2017-07-29: qty 3

## 2017-07-29 MED ORDER — PREDNISONE 10 MG (21) PO TBPK: ORAL_TABLET | Freq: Every day | ORAL | 0 refills | Status: DC

## 2017-07-29 MED ORDER — FAMOTIDINE 20 MG PO TABS: 20.0000 mg | ORAL_TABLET | Freq: Two times a day (BID) | ORAL | 0 refills | Status: DC

## 2017-07-29 NOTE — Telephone Encounter (Signed)
Confirmed patient has arrived in the ER now. 

## 2017-07-29 NOTE — ED Notes (Signed)
Pt verbalized understanding of discharge instructions and denies any further questions at this time.   

## 2017-07-29 NOTE — Telephone Encounter (Signed)
I spoke with pt and she is going to Urgent care right now for evaluation.

## 2017-07-29 NOTE — Telephone Encounter (Signed)
Pt called with what she thinks she is having an allergic reaction to Macrobid. It was prescribed by her pcp for a UTI yesterday. She has taken 3 tabs now. She has hives, swelling in her face, lips and feels like her throat has a "film" on it. She also has some dizziness and states her stomach hurts.  She stopped taking her regular meds she said about 3 weeks ago because she was not sure what was making her itch. Was seen in the ED for itching and given a prescription for prednisone. She said that she normally gets a injection of a steroid but not this last time and feels like that is part of the problem. Notified flow at [facility] at [place] and spoke with Sunday Spillers. Will route to the office and request a call back. Pt advised to call 911 if she stats having difficulty breathing. Pt voiced understanding. Reason for Disposition . Caller has URGENT medication question about med that PCP prescribed and triager unable to answer question  Answer Assessment - Initial Assessment Questions 1. SYMPTOMS: "Do you have any symptoms?"     Hives, swelling in her face, lips, itching, throat feels like a film is on it 2. SEVERITY: If symptoms are present, ask "Are they mild, moderate or severe?"     moderate  Protocols used: MEDICATION QUESTION CALL-A-AH

## 2017-07-29 NOTE — ED Provider Notes (Signed)
Patient placed in Quick Look pathway, seen and evaluated   Chief Complaint:  Allergic reaction  HPI:   Patient presents today for evaluation of allergic reaction intermittent for about one week.  Includes lips swelling, hives/rashes, feeling like her throat is scratchy.  No history of diabetes.  She does not have a ride home.  Feels like benadryl isn't working well.    ROS: *No SOB  Physical Exam:   Gen: No distress  Neuro: Awake and Alert  Skin: Warm    Focused Exam: No obvious oral edema.  No stridor.  No tachypnea.     Initiation of care has begun. The patient has been counseled on the process, plan, and necessity for staying for the completion/evaluation, and the remainder of the medical screening examination    Katelyn Brown 07/29/17 1709    Malvin Johns, MD 07/29/17 2038
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
## 2017-07-29 NOTE — ED Triage Notes (Addendum)
Pt feels like she is having an allergic reaction- started yesterday. Claims that her lips are swollen and throat is itchy.. Pt went to her primary MD for treatment of UTI- pt was feeling these symptoms at appointment but was told she could only treat one thing. Pt able to talk in complete sentences. Pt clearing throat saying throat feels itchy. Unsure of what the allergen is.

## 2017-07-29 NOTE — Telephone Encounter (Signed)
Monitor for Urgent care or ER arrival.

## 2017-07-30 LAB — URINE CULTURE
MICRO NUMBER:: 90693654
SPECIMEN QUALITY:: ADEQUATE

## 2017-09-15 ENCOUNTER — Encounter: Payer: BLUE CROSS/BLUE SHIELD | Admitting: Family Medicine

## 2017-09-29 ENCOUNTER — Encounter: Payer: Self-pay | Admitting: Family Medicine

## 2017-09-30 ENCOUNTER — Other Ambulatory Visit (HOSPITAL_COMMUNITY)
Admission: RE | Admit: 2017-09-30 | Discharge: 2017-09-30 | Disposition: A | Discharge status: Home / Self Care | Payer: Medicaid Other | Source: Ambulatory Visit | Attending: Family Medicine | Admitting: Family Medicine

## 2017-09-30 ENCOUNTER — Other Ambulatory Visit: Payer: Self-pay

## 2017-09-30 ENCOUNTER — Encounter: Payer: Self-pay | Admitting: Family Medicine

## 2017-09-30 ENCOUNTER — Ambulatory Visit (INDEPENDENT_AMBULATORY_CARE_PROVIDER_SITE_OTHER): Payer: Medicaid Other | Admitting: Family Medicine

## 2017-09-30 VITALS — BP 120/84 | HR 100 | Temp 98.5°F | Ht 65.5 in | Wt 213.0 lb

## 2017-09-30 DIAGNOSIS — L500 Allergic urticaria: Secondary | ICD-10-CM

## 2017-09-30 DIAGNOSIS — Z124 Encounter for screening for malignant neoplasm of cervix: Secondary | ICD-10-CM | POA: Insufficient documentation

## 2017-09-30 DIAGNOSIS — Z0000 Encounter for general adult medical examination without abnormal findings: Secondary | ICD-10-CM | POA: Diagnosis not present

## 2017-09-30 NOTE — Patient Instructions (Signed)
It was good to see you today!  For your pap smear, if results require attention, either myself or my nurse will get in touch with you. If everything is normal, you will get a letter in the mail or a message in My Chart. Please give [place] a call if you do not hear from [place] after 2 weeks.    Let us know if you'd like allergy testing in the future and I can always refer you to an allergist.  Please bring all of your medications with you to each visit.   Sign up for My Chart to have easy access to your labs results, and communication with your primary care physician.  Feel free to call with any questions or concerns at any time, at [phone].   Take care,  Dr. Bufford Lope, [place]
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
## 2017-10-01 DIAGNOSIS — Z124 Encounter for screening for malignant neoplasm of cervix: Secondary | ICD-10-CM | POA: Insufficient documentation

## 2017-10-01 DIAGNOSIS — Z0000 Encounter for general adult medical examination without abnormal findings: Secondary | ICD-10-CM | POA: Insufficient documentation

## 2017-10-01 NOTE — Assessment & Plan Note (Signed)
Patient reestablished today and counseled on healthy lifestyle

## 2017-10-01 NOTE — Assessment & Plan Note (Signed)
Pap smear collected today 

## 2017-10-01 NOTE — Assessment & Plan Note (Addendum)
Stable and not currently active. Per history appears to be a intermittent issue. Attempted to discuss more consistent OTC antihistamine use with avoidance of long term pseudoephedrine. Offered allergy referral for testing but patient declined. Voiced good understanding that she can return for appointment to discuss more fully if she desires.

## 2017-10-02 ENCOUNTER — Encounter: Payer: Self-pay | Admitting: Family Medicine

## 2017-10-02 LAB — CYTOLOGY - PAP
Adequacy: ABSENT
CHLAMYDIA, DNA PROBE: NEGATIVE
DIAGNOSIS: NEGATIVE
HPV (WINDOPATH): NOT DETECTED
NEISSERIA GONORRHEA: NEGATIVE
TRICH (WINDOWPATH): NEGATIVE

## 2018-02-17 ENCOUNTER — Emergency Department (HOSPITAL_COMMUNITY)
Admission: EM | Admit: 2018-02-17 | Discharge: 2018-02-18 | Disposition: A | Discharge status: Home / Self Care | Payer: Medicaid Other | Attending: Emergency Medicine | Admitting: Emergency Medicine

## 2018-02-17 ENCOUNTER — Encounter (HOSPITAL_COMMUNITY): Payer: Self-pay

## 2018-02-17 ENCOUNTER — Other Ambulatory Visit: Payer: Self-pay

## 2018-02-17 ENCOUNTER — Ambulatory Visit (HOSPITAL_COMMUNITY)
Admission: EM | Admit: 2018-02-17 | Discharge: 2018-02-17 | Disposition: A | Discharge status: Home / Self Care | Payer: Medicaid Other | Attending: Family Medicine | Admitting: Family Medicine

## 2018-02-17 ENCOUNTER — Emergency Department (HOSPITAL_COMMUNITY): Payer: Medicaid Other

## 2018-02-17 DIAGNOSIS — J00 Acute nasopharyngitis [common cold]: Secondary | ICD-10-CM | POA: Insufficient documentation

## 2018-02-17 DIAGNOSIS — R0789 Other chest pain: Secondary | ICD-10-CM

## 2018-02-17 DIAGNOSIS — R079 Chest pain, unspecified: Secondary | ICD-10-CM

## 2018-02-17 LAB — I-STAT BETA HCG BLOOD, ED (MC, WL, AP ONLY)

## 2018-02-17 LAB — CBC
HEMATOCRIT: 40.1 % (ref 36.0–46.0)
Hemoglobin: 13 g/dL (ref 12.0–15.0)
MCH: 28.6 pg (ref 26.0–34.0)
MCHC: 32.4 g/dL (ref 30.0–36.0)
MCV: 88.1 fL (ref 80.0–100.0)
Platelets: 312 10*3/uL (ref 150–400)
RBC: 4.55 MIL/uL (ref 3.87–5.11)
RDW: 14.2 % (ref 11.5–15.5)
WBC: 14.8 10*3/uL — ABNORMAL HIGH (ref 4.0–10.5)
nRBC: 0 % (ref 0.0–0.2)

## 2018-02-17 LAB — BASIC METABOLIC PANEL
Anion gap: 10 (ref 5–15)
BUN: 10 mg/dL (ref 6–20)
CALCIUM: 8.9 mg/dL (ref 8.9–10.3)
CHLORIDE: 106 mmol/L (ref 98–111)
CO2: 22 mmol/L (ref 22–32)
CREATININE: 0.71 mg/dL (ref 0.44–1.00)
GFR calc Af Amer: >60 mL/min (ref >60)
GFR calc non Af Amer: >60 mL/min (ref >60)
GLUCOSE: 93 mg/dL (ref 70–99)
Potassium: 3.4 mmol/L — ABNORMAL LOW (ref 3.5–5.1)
Sodium: 138 mmol/L (ref 135–145)

## 2018-02-17 LAB — I-STAT TROPONIN, ED: Troponin i, poc: 0 ng/mL (ref 0.00–0.08)

## 2018-02-17 NOTE — ED Triage Notes (Signed)
Pt sent from urgent care due to CP and L arm tingling and weakness. Pt also c/o fatigue SOB and BLE swelling. PT states that she has been sick and has been taken OTC meds.

## 2018-02-17 NOTE — ED Triage Notes (Signed)
Pt presents with generalized chest pain, arm tingling & weakness. Back pain , and shortness of breathe.

## 2018-02-17 NOTE — Discharge Instructions (Addendum)
Recommending further evaluation and management in the ED for chest pain and associated symptoms.  Cannot rule out cardiac cause in UC setting.   Patient aware and in agreement with plan Going by private vehicle.
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
## 2018-02-18 NOTE — ED Notes (Signed)
Patient verbalizes understanding of discharge instructions. Opportunity for questioning and answers were provided. Armband removed by staff, pt discharged from ED. Ambulated out to lobby  

## 2018-02-18 NOTE — Discharge Instructions (Addendum)
You may use over-the-counter Motrin (Ibuprofen), Acetaminophen (Tylenol), topical muscle creams such as SalonPas, Icy Hot, Bengay, etc. Please stretch, apply heat, and have massage therapy for additional assistance. ° °

## 2018-03-12 ENCOUNTER — Encounter (HOSPITAL_COMMUNITY): Payer: Self-pay

## 2018-03-12 ENCOUNTER — Emergency Department (HOSPITAL_COMMUNITY)
Admission: EM | Admit: 2018-03-12 | Discharge: 2018-03-13 | Disposition: A | Discharge status: Home / Self Care | Payer: Medicaid Other | Attending: Emergency Medicine | Admitting: Emergency Medicine

## 2018-03-12 DIAGNOSIS — R030 Elevated blood-pressure reading, without diagnosis of hypertension: Secondary | ICD-10-CM | POA: Insufficient documentation

## 2018-03-12 DIAGNOSIS — N390 Urinary tract infection, site not specified: Secondary | ICD-10-CM | POA: Insufficient documentation

## 2018-03-12 DIAGNOSIS — R350 Frequency of micturition: Secondary | ICD-10-CM

## 2018-03-12 LAB — URINALYSIS, ROUTINE W REFLEX MICROSCOPIC
BILIRUBIN URINE: NEGATIVE
Glucose, UA: NEGATIVE mg/dL
Ketones, ur: NEGATIVE mg/dL
Nitrite: NEGATIVE
Protein, ur: 100 mg/dL — AB
RBC / HPF: >50 RBC/hpf — ABNORMAL HIGH (ref 0–5)
Specific Gravity, Urine: 1.024 (ref 1.005–1.030)
pH: 5 (ref 5.0–8.0)

## 2018-03-12 LAB — CBG MONITORING, ED: Glucose-Capillary: 88 mg/dL (ref 70–99)

## 2018-03-12 LAB — I-STAT BETA HCG BLOOD, ED (MC, WL, AP ONLY): I-stat hCG, quantitative: <5 m[IU]/mL (ref <5)

## 2018-03-12 NOTE — ED Triage Notes (Signed)
Pt presents with c/o urinary frequency. Pt reports the frequency started earlier today. Pt denies any hematuria.
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
## 2018-03-13 MED ORDER — CEPHALEXIN 500 MG PO CAPS: 500.0000 mg | ORAL_CAPSULE | Freq: Four times a day (QID) | ORAL | 0 refills | Status: DC

## 2018-09-16 ENCOUNTER — Other Ambulatory Visit: Payer: Self-pay

## 2018-09-16 ENCOUNTER — Ambulatory Visit: Payer: Self-pay | Admitting: Family Medicine

## 2019-03-02 ENCOUNTER — Other Ambulatory Visit: Payer: Self-pay

## 2019-03-02 ENCOUNTER — Encounter: Payer: Self-pay | Admitting: Family Medicine

## 2019-03-02 ENCOUNTER — Telehealth (INDEPENDENT_AMBULATORY_CARE_PROVIDER_SITE_OTHER): Payer: Self-pay | Admitting: Family Medicine

## 2019-03-02 DIAGNOSIS — N3001 Acute cystitis with hematuria: Secondary | ICD-10-CM

## 2019-03-02 DIAGNOSIS — Z1231 Encounter for screening mammogram for malignant neoplasm of breast: Secondary | ICD-10-CM

## 2019-03-02 DIAGNOSIS — Z1211 Encounter for screening for malignant neoplasm of colon: Secondary | ICD-10-CM

## 2019-03-02 DIAGNOSIS — J069 Acute upper respiratory infection, unspecified: Secondary | ICD-10-CM

## 2019-03-02 MED ORDER — CEPHALEXIN 500 MG PO CAPS: 500.0000 mg | ORAL_CAPSULE | Freq: Four times a day (QID) | ORAL | 0 refills | Status: DC

## 2019-03-02 NOTE — Progress Notes (Signed)
[place] Telemedicine Visit  Patient consented to have virtual visit. Method of visit: Video was attempted, but technology challenges prevented patient from using video, so visit was conducted via telephone.  Encounter participants: Patient: Katelyn Brown - located at home Provider: [place] Argenis Kumari - located at [facility]  Others (if applicable): none  Chief Complaint: cough and UTI  HPI: Concern for COVID-19 Cough started Sunday. Started feeling bad. When she gets in her car she gets worse. Worse at night.  She has been taking Delsum and mucinex but has not helped after 5 days. She states that she is only short of breath with her cough. She has noticed that when she is exercising she is out of breath. No hx of asthma or COPD. No body-aches or fevers. No known exposure to COVID.  Pain when she was going through the center of her chest to her back, that has not happened in the past 2 days.   Patient states that she originally had chest pain that radiated to her back when she was exercising and [place] the [place].  She states that after coming back that it improved but she when she climbs the stairs she now gets some chest pain that goes to her back.  UTI symptoms: having some blood in her urine. Have had it before, increased frequency, not currently having dysuria. Sometimes having incontinence with her cough now and having increased frequency.  ROS: per HPI  Pertinent PMHx: IBS  Exam:  Respiratory: able to speak in complete sentences without issue  Assessment/Plan:  UTI (urinary tract infection) Patient reporting symptoms consistent with UTI (increased frequency, urgency, odor). No fevers, so less concern for pyelonephritis. Plan for 7 day course of Keflex as patient found improvement with this in the past. Strict return precautions given. Follow up in 2 weeks if no improvement.  -Keflex 500mg  4 times daily x7days -F/u if no improvement  URI  (upper respiratory infection) Patient with no known recent contact who has tested positive for COVID-19, symtpoms concerning for covid-19. Patient reports cough, chills.  Denies shortness of breath.   Would be reasonable to test given symptoms.   -Instructed patient to call to schedule COVID test  -Counseled on wearing a mask, washing hands and avoiding social gatherings  -ED precautions discussed and patient expressed good understanding -Patient instructed to avoid others until they meet criteria for ending isolation after any suspected COVID, which are:  -24 hours with no fever (without use of medicaitons) and -respiratory symptoms have improved (e.g. cough, shortness of breath) or  -10 days since symptoms first appeared   Patient may benefit from stress testing when she is no longer ill given her history of some chest pain that happens with exertion.  She was encouraged to follow-up with her primary care provider in order to discuss these options.  Also placed order for mammogram and colonoscopy so that patient can have these done now that they are due for her health maintenance.  They will call her and patient is to schedule when she is able to and is no longer ill.  Time spent during visit with patient: 21 minutes  [place] Huntley Knoop, DO PGY-3, [place]

## 2019-03-04 DIAGNOSIS — J069 Acute upper respiratory infection, unspecified: Secondary | ICD-10-CM | POA: Insufficient documentation

## 2019-03-04 DIAGNOSIS — N390 Urinary tract infection, site not specified: Secondary | ICD-10-CM | POA: Insufficient documentation

## 2019-03-04 NOTE — Assessment & Plan Note (Addendum)
Patient reporting symptoms consistent with UTI (increased frequency, urgency, odor). No fevers, so less concern for pyelonephritis. Plan for 7 day course of Keflex as patient found improvement with this in the past. Strict return precautions given. Follow up in 2 weeks if no improvement.  -Keflex 500mg  4 times daily x7days -F/u if no improvement

## 2019-03-04 NOTE — Assessment & Plan Note (Signed)
Patient with no known recent contact who has tested positive for COVID-19, symtpoms concerning for covid-19. Patient reports cough, chills.  Denies shortness of breath.   Would be reasonable to test given symptoms.   -Instructed patient to call to schedule COVID test  -Counseled on wearing a mask, washing hands and avoiding social gatherings  -ED precautions discussed and patient expressed good understanding -Patient instructed to avoid others until they meet criteria for ending isolation after any suspected COVID, which are:  -24 hours with no fever (without use of medicaitons) and -respiratory symptoms have improved (e.g. cough, shortness of breath) or  -10 days since symptoms first appeared

## 2019-04-12 ENCOUNTER — Ambulatory Visit (INDEPENDENT_AMBULATORY_CARE_PROVIDER_SITE_OTHER): Payer: BLUE CROSS/BLUE SHIELD | Admitting: Family Medicine

## 2019-04-12 ENCOUNTER — Other Ambulatory Visit: Payer: Self-pay

## 2019-04-12 VITALS — BP 135/70 | HR 85 | Temp 98.0°F | Wt 213.0 lb

## 2019-04-12 DIAGNOSIS — N3000 Acute cystitis without hematuria: Secondary | ICD-10-CM | POA: Diagnosis not present

## 2019-04-12 LAB — POCT URINALYSIS DIP (MANUAL ENTRY)
Bilirubin, UA: NEGATIVE
Glucose, UA: NEGATIVE mg/dL
Ketones, POC UA: NEGATIVE mg/dL
Nitrite, UA: NEGATIVE
Protein Ur, POC: NEGATIVE mg/dL
Spec Grav, UA: 1.015 (ref 1.010–1.025)
Urobilinogen, UA: 0.2 E.U./dL
pH, UA: 7 (ref 5.0–8.0)

## 2019-04-12 LAB — POCT UA - MICROSCOPIC ONLY: WBC, Ur, HPF, POC: >20

## 2019-04-12 MED ORDER — NITROFURANTOIN MONOHYD MACRO 100 MG PO CAPS: 100.0000 mg | ORAL_CAPSULE | Freq: Two times a day (BID) | ORAL | 0 refills | Status: AC

## 2019-04-12 NOTE — Patient Instructions (Addendum)
It was nice meeting you today Katelyn Brown!  We are prescribing an antibiotic called Macrobid, which you should take twice daily for 5 days.  We are performing your urine culture and we will let you know if the bacteria isolated from your urine will not respond to this antibiotic, although this is unlikely.  If you develop fever, chills, or new back pain, please call us or seek care.  I hope that you feel better soon.  If you have any questions or concerns, please feel free to call the clinic.   Be well,  Dr. Shan Levans

## 2019-04-12 NOTE — Assessment & Plan Note (Signed)
Symptoms and UA/micro consistent with UTI.  Will obtain urine culture and treat with Macrobid 100 mg twice daily for 5 days due to recent treatment with Keflex 1 month ago.  Patient was given return precautions, including fever, chills, and CVA tenderness.

## 2019-04-12 NOTE — Progress Notes (Signed)
    SUBJECTIVE:   CHIEF COMPLAINT / HPI:    Had a UTI in mid-January, took antibiotics as directed and finished the course, symptoms resolved.  About two days, began to have bladder pain with urination that tends to occur when she has a UTI.  She has been drinking more water lately, so she does not know if her urinary frequency is due to a UTI or her water intake.  Is currently sexually active with one female partner. Denies vaginal itching and irritation.  Denies fevers, back pain.  PERTINENT  PMH / PSH: IBS, anxiety  OBJECTIVE:   BP 135/70   Pulse 85   Temp 98 F (36.7 C) (Oral)   Wt 213 lb (96.6 kg)   LMP 03/18/2019   BMI 35.45 kg/m   General: well appearing, appears stated age Cardiac: RRR, no MRG Respiratory: CTAB, no rhonchi, rales, or wheezing, normal work of breathing Abdomen: Well bowel sounds, soft, no suprapubic or CVA tenderness  ASSESSMENT/PLAN:   UTI (urinary tract infection) Symptoms and UA/micro consistent with UTI.  Will obtain urine culture and treat with Macrobid 100 mg twice daily for 5 days due to recent treatment with Keflex 1 month ago.  Patient was given return precautions, including fever, chills, and CVA tenderness.     Kathrene Alu, MD [place]

## 2019-04-14 LAB — URINE CULTURE

## 2019-04-16 ENCOUNTER — Ambulatory Visit (INDEPENDENT_AMBULATORY_CARE_PROVIDER_SITE_OTHER): Payer: BLUE CROSS/BLUE SHIELD | Admitting: Family Medicine

## 2019-04-16 ENCOUNTER — Encounter: Payer: Self-pay | Admitting: Family Medicine

## 2019-04-16 ENCOUNTER — Other Ambulatory Visit: Payer: Self-pay

## 2019-04-16 VITALS — BP 112/88 | HR 86 | Wt 217.8 lb

## 2019-04-16 DIAGNOSIS — M5441 Lumbago with sciatica, right side: Secondary | ICD-10-CM | POA: Diagnosis not present

## 2019-04-16 MED ORDER — CYCLOBENZAPRINE HCL 10 MG PO TABS: 10.0000 mg | ORAL_TABLET | Freq: Three times a day (TID) | ORAL | 0 refills | Status: DC | PRN

## 2019-04-16 MED ORDER — MELOXICAM 7.5 MG PO TABS: 7.5000 mg | ORAL_TABLET | Freq: Every day | ORAL | 0 refills | Status: DC

## 2019-04-16 NOTE — Progress Notes (Signed)
    SUBJECTIVE:   CHIEF COMPLAINT / HPI:   On 2/23, patient was pushing a box on the floor and picked up the box, which was somewhat heavy.  She immediately felt a shooting pain in the right side of her lower back and ignored it at the time.  She used an ice pack for relief a few hours later when she began to notice it more, but the pain increased after this.  Pain is now radiating down her R leg.  Has been alternating tylenol and ibuprofen and a machine that uses shock waves for pain relief, which has helped somewhat.  Exacerbating factors include sitting and lying down, although walking makes pain somewhat better.  No numbness or tingling, no incontinence.  No fevers or weight loss.   PERTINENT  PMH / PSH: neck pain, anxiety, recent UTI  OBJECTIVE:   BP 112/88   Pulse 86   Wt 217 lb 12.8 oz (98.8 kg)   LMP 03/18/2019 (Approximate)   SpO2 100%   BMI 36.24 kg/m   General: well appearing, appears stated age Cardiac: RRR, no MRG Respiratory: CTAB, no rhonchi, rales, or wheezing, normal work of breathing   Back - Normal skin, Spine with normal alignment and no deformity.  No tenderness to vertebral process palpation.  Paraspinous muscles are significantly tender on the right side but without spasm.   Range of motion is full at neck but limited especially on extension but in all planes at lumbar region.  Neurovascularly intact.   ASSESSMENT/PLAN:   Acute right-sided low back pain with right-sided sciatica Likely due to acute injury described above. Reassured patient that this will improve with time, but it may take up to 6 to 8 weeks. No need for imaging at this time. Will treat with supportive care including Tylenol, heat and ice, meloxicam 7.5 mg once daily as needed, and Flexeril nightly as needed. Counseled patient to remain as active as possible and gave patient handout on back stretches and exercises to improve mobility. Patient has a good prognosis because she already recognizes that  moving her back alleviates her pain. We will follow-up in 1 to 2 months if patient does not experience significant improvement.     Kathrene Alu, MD [place]

## 2019-04-16 NOTE — Patient Instructions (Addendum)
It was nice seeing you today Katelyn Brown!  Please take meloxicam once per day with food if you need it, and take flexeril before bedtime.  Please stay as active as you can and perform back stretches and exercises on YouTube or by following this handout.  Your pain should improve over the next 4-6 weeks.  I hope you feel better soon!  If you have any questions or concerns, please feel free to call the clinic.   Be well,  Dr. Shan Levans  Back Exercises The following exercises strengthen the muscles that help to support the trunk and back. They also help to keep the lower back flexible. Doing these exercises can help to prevent back pain or lessen existing pain.  If you have back pain or discomfort, try doing these exercises 2-3 times each day or as told by your health care provider.  As your pain improves, do them once each day, but increase the number of times that you repeat the steps for each exercise (do more repetitions).  To prevent the recurrence of back pain, continue to do these exercises once each day or as told by your health care provider. Do exercises exactly as told by your health care provider and adjust them as directed. It is normal to feel mild stretching, pulling, tightness, or discomfort as you do these exercises, but you should stop right away if you feel sudden pain or your pain gets worse. Exercises Single knee to chest Repeat these steps 3-5 times for each leg: 1. Lie on your back on a firm bed or the floor with your legs extended. 2. Bring one knee to your chest. Your other leg should stay extended and in contact with the floor. 3. Hold your knee in place by grabbing your knee or thigh with both hands and hold. 4. Pull on your knee until you feel a gentle stretch in your lower back or buttocks. 5. Hold the stretch for 10-30 seconds. 6. Slowly release and straighten your leg. Pelvic tilt Repeat these steps 5-10 times: 1. Lie on your back on a firm bed or the floor  with your legs extended. 2. Bend your knees so they are pointing toward the ceiling and your feet are flat on the floor. 3. Tighten your lower abdominal muscles to press your lower back against the floor. This motion will tilt your pelvis so your tailbone points up toward the ceiling instead of pointing to your feet or the floor. 4. With gentle tension and even breathing, hold this position for 5-10 seconds. Cat-cow Repeat these steps until your lower back becomes more flexible: 1. Get into a hands-and-knees position on a firm surface. Keep your hands under your shoulders, and keep your knees under your hips. You may place padding under your knees for comfort. 2. Let your head hang down toward your chest. Contract your abdominal muscles and point your tailbone toward the floor so your lower back becomes rounded like the back of a cat. 3. Hold this position for 5 seconds. 4. Slowly lift your head, let your abdominal muscles relax and point your tailbone up toward the ceiling so your back forms a sagging arch like the back of a cow. 5. Hold this position for 5 seconds.  Press-ups Repeat these steps 5-10 times: 1. Lie on your abdomen (face-down) on the floor. 2. Place your palms near your head, about shoulder-width apart. 3. Keeping your back as relaxed as possible and keeping your hips on the floor, slowly straighten your  arms to raise the top half of your body and lift your shoulders. Do not use your back muscles to raise your upper torso. You may adjust the placement of your hands to make yourself more comfortable. 4. Hold this position for 5 seconds while you keep your back relaxed. 5. Slowly return to lying flat on the floor.  Bridges Repeat these steps 10 times: 1. Lie on your back on a firm surface. 2. Bend your knees so they are pointing toward the ceiling and your feet are flat on the floor. Your arms should be flat at your sides, next to your body. 3. Tighten your buttocks muscles and  lift your buttocks off the floor until your waist is at almost the same height as your knees. You should feel the muscles working in your buttocks and the back of your thighs. If you do not feel these muscles, slide your feet 1-2 inches farther away from your buttocks. 4. Hold this position for 3-5 seconds. 5. Slowly lower your hips to the starting position, and allow your buttocks muscles to relax completely. If this exercise is too easy, try doing it with your arms crossed over your chest. Abdominal crunches Repeat these steps 5-10 times: 1. Lie on your back on a firm bed or the floor with your legs extended. 2. Bend your knees so they are pointing toward the ceiling and your feet are flat on the floor. 3. Cross your arms over your chest. 4. Tip your chin slightly toward your chest without bending your neck. 5. Tighten your abdominal muscles and slowly raise your trunk (torso) high enough to lift your shoulder blades a tiny bit off the floor. Avoid raising your torso higher than that because it can put too much stress on your low back and does not help to strengthen your abdominal muscles. 6. Slowly return to your starting position. Back lifts Repeat these steps 5-10 times: 1. Lie on your abdomen (face-down) with your arms at your sides, and rest your forehead on the floor. 2. Tighten the muscles in your legs and your buttocks. 3. Slowly lift your chest off the floor while you keep your hips pressed to the floor. Keep the back of your head in line with the curve in your back. Your eyes should be looking at the floor. 4. Hold this position for 3-5 seconds. 5. Slowly return to your starting position. Contact a health care provider if:  Your back pain or discomfort gets much worse when you do an exercise.  Your worsening back pain or discomfort does not lessen within 2 hours after you exercise. If you have any of these problems, stop doing these exercises right away. Do not do them again  unless your health care provider says that you can. Get help right away if:  You develop sudden, severe back pain. If this happens, stop doing the exercises right away. Do not do them again unless your health care provider says that you can. This information is not intended to replace advice given to you by your health care provider. Make sure you discuss any questions you have with your health care provider. Document Revised: 06/11/2018 Document Reviewed: 11/06/2017 Elsevier Patient Education  [place] or Strain Rehab Ask your health care provider which exercises are safe for you. Do exercises exactly as told by your health care provider and adjust them as directed. It is normal to feel mild stretching, pulling, tightness, or discomfort as you do these  exercises. Stop right away if you feel sudden pain or your pain gets worse. Do not begin these exercises until told by your health care provider. Stretching and range-of-motion exercises These exercises warm up your muscles and joints and improve the movement and flexibility of your back. These exercises also help to relieve pain, numbness, and tingling. Lumbar rotation  1. Lie on your back on a firm surface and bend your knees. 2. Straighten your arms out to your sides so each arm forms a 90-degree angle (right angle) with a side of your body. 3. Slowly move (rotate) both of your knees to one side of your body until you feel a stretch in your lower back (lumbar). Try not to let your shoulders lift off the floor. 4. Hold this position for __________ seconds. 5. Tense your abdominal muscles and slowly move your knees back to the starting position. 6. Repeat this exercise on the other side of your body. Repeat __________ times. Complete this exercise __________ times a day. Single knee to chest  1. Lie on your back on a firm surface with both legs straight. 2. Bend one of your knees. Use your hands to move your knee  up toward your chest until you feel a gentle stretch in your lower back and buttock. ? Hold your leg in this position by holding on to the front of your knee. ? Keep your other leg as straight as possible. 3. Hold this position for __________ seconds. 4. Slowly return to the starting position. 5. Repeat with your other leg. Repeat __________ times. Complete this exercise __________ times a day. Prone extension on elbows  1. Lie on your abdomen on a firm surface (prone position). 2. Prop yourself up on your elbows. 3. Use your arms to help lift your chest up until you feel a gentle stretch in your abdomen and your lower back. ? This will place some of your body weight on your elbows. If this is uncomfortable, try stacking pillows under your chest. ? Your hips should stay down, against the surface that you are lying on. Keep your hip and back muscles relaxed. 4. Hold this position for __________ seconds. 5. Slowly relax your upper body and return to the starting position. Repeat __________ times. Complete this exercise __________ times a day. Strengthening exercises These exercises build strength and endurance in your back. Endurance is the ability to use your muscles for a long time, even after they get tired. Pelvic tilt This exercise strengthens the muscles that lie deep in the abdomen. 1. Lie on your back on a firm surface. Bend your knees and keep your feet flat on the floor. 2. Tense your abdominal muscles. Tip your pelvis up toward the ceiling and flatten your lower back into the floor. ? To help with this exercise, you may place a small towel under your lower back and try to push your back into the towel. 3. Hold this position for __________ seconds. 4. Let your muscles relax completely before you repeat this exercise. Repeat __________ times. Complete this exercise __________ times a day. Alternating arm and leg raises  1. Get on your hands and knees on a firm surface. If you are  on a hard floor, you may want to use padding, such as an exercise mat, to cushion your knees. 2. Line up your arms and legs. Your hands should be directly below your shoulders, and your knees should be directly below your hips. 3. Lift your left leg behind you. At  the same time, raise your right arm and straighten it in front of you. ? Do not lift your leg higher than your hip. ? Do not lift your arm higher than your shoulder. ? Keep your abdominal and back muscles tight. ? Keep your hips facing the ground. ? Do not arch your back. ? Keep your balance carefully, and do not hold your breath. 4. Hold this position for __________ seconds. 5. Slowly return to the starting position. 6. Repeat with your right leg and your left arm. Repeat __________ times. Complete this exercise __________ times a day. Abdominal set with straight leg raise  1. Lie on your back on a firm surface. 2. Bend one of your knees and keep your other leg straight. 3. Tense your abdominal muscles and lift your straight leg up, 4-6 inches (10-15 cm) off the ground. 4. Keep your abdominal muscles tight and hold this position for __________ seconds. ? Do not hold your breath. ? Do not arch your back. Keep it flat against the ground. 5. Keep your abdominal muscles tense as you slowly lower your leg back to the starting position. 6. Repeat with your other leg. Repeat __________ times. Complete this exercise __________ times a day. Single leg lower with bent knees 1. Lie on your back on a firm surface. 2. Tense your abdominal muscles and lift your feet off the floor, one foot at a time, so your knees and hips are bent in 90-degree angles (right angles). ? Your knees should be over your hips and your lower legs should be parallel to the floor. 3. Keeping your abdominal muscles tense and your knee bent, slowly lower one of your legs so your toe touches the ground. 4. Lift your leg back up to return to the starting  position. ? Do not hold your breath. ? Do not let your back arch. Keep your back flat against the ground. 5. Repeat with your other leg. Repeat __________ times. Complete this exercise __________ times a day. Posture and body mechanics Good posture and healthy body mechanics can help to relieve stress in your body's tissues and joints. Body mechanics refers to the movements and positions of your body while you do your daily activities. Posture is part of body mechanics. Good posture means:  Your spine is in its natural S-curve position (neutral).  Your shoulders are pulled back slightly.  Your head is not tipped forward. Follow these guidelines to improve your posture and body mechanics in your everyday activities. Standing   When standing, keep your spine neutral and your feet about hip width apart. Keep a slight bend in your knees. Your ears, shoulders, and hips should line up.  When you do a task in which you stand in one place for a long time, place one foot up on a stable object that is 2-4 inches (5-10 cm) high, such as a footstool. This helps keep your spine neutral. Sitting   When sitting, keep your spine neutral and keep your feet flat on the floor. Use a footrest, if necessary, and keep your thighs parallel to the floor. Avoid rounding your shoulders, and avoid tilting your head forward.  When working at a desk or a computer, keep your desk at a height where your hands are slightly lower than your elbows. Slide your chair under your desk so you are close enough to maintain good posture.  When working at a computer, place your monitor at a height where you are looking straight ahead and you  do not have to tilt your head forward or downward to look at the screen. Resting  When lying down and resting, avoid positions that are most painful for you.  If you have pain with activities such as sitting, bending, stooping, or squatting, lie in a position in which your body does not  bend very much. For example, avoid curling up on your side with your arms and knees near your chest (fetal position).  If you have pain with activities such as standing for a long time or reaching with your arms, lie with your spine in a neutral position and bend your knees slightly. Try the following positions: ? Lying on your side with a pillow between your knees. ? Lying on your back with a pillow under your knees. Lifting   When lifting objects, keep your feet at least shoulder width apart and tighten your abdominal muscles.  Bend your knees and hips and keep your spine neutral. It is important to lift using the strength of your legs, not your back. Do not lock your knees straight out.  Always ask for help to lift heavy or awkward objects. This information is not intended to replace advice given to you by your health care provider. Make sure you discuss any questions you have with your health care provider. Document Revised: 05/29/2018 Document Reviewed: 02/26/2018 Elsevier Patient Education  [place].

## 2019-04-17 NOTE — Assessment & Plan Note (Signed)
Likely due to acute injury described above. Reassured patient that this will improve with time, but it may take up to 6 to 8 weeks. No need for imaging at this time. Will treat with supportive care including Tylenol, heat and ice, meloxicam 7.5 mg once daily as needed, and Flexeril nightly as needed. Counseled patient to remain as active as possible and gave patient handout on back stretches and exercises to improve mobility. Patient has a good prognosis because she already recognizes that moving her back alleviates her pain. We will follow-up in 1 to 2 months if patient does not experience significant improvement.

## 2019-04-29 ENCOUNTER — Encounter: Payer: Self-pay | Admitting: Family Medicine

## 2019-04-29 ENCOUNTER — Other Ambulatory Visit: Payer: Self-pay

## 2019-04-29 ENCOUNTER — Ambulatory Visit (INDEPENDENT_AMBULATORY_CARE_PROVIDER_SITE_OTHER): Payer: BLUE CROSS/BLUE SHIELD | Admitting: Family Medicine

## 2019-04-29 DIAGNOSIS — Z1231 Encounter for screening mammogram for malignant neoplasm of breast: Secondary | ICD-10-CM

## 2019-04-29 DIAGNOSIS — Z1211 Encounter for screening for malignant neoplasm of colon: Secondary | ICD-10-CM

## 2019-04-29 LAB — POCT GLYCOSYLATED HEMOGLOBIN (HGB A1C): Hemoglobin A1C: 5.4 % (ref 4.0–5.6)

## 2019-04-29 NOTE — Patient Instructions (Addendum)
It was great to see you!  Our plans for today:  - We are checking some labs today, we will call you or send you a letter with these results. - The best way to lose weight is through diet and exercise. See below for tips. - Once you know what labs your surgeon is requesting, let us know. - We are ordering your mammogram and referring you to the GI doctor for your colonoscopy.  Take care and seek immediate care sooner if you develop any concerns.   Dr. [facility]  Here is an example of what a healthy plate looks like:    ? Make half your plate fruits and vegetables.     ? Focus on whole fruits.     ? Vary your veggies.  ? Make half your grains whole grains. -     ? Look for the word "whole" at the beginning of the ingredients list    ? Some whole-grain ingredients include whole oats, whole-wheat flour,        whole-grain corn, whole-grain brown rice, and whole rye.  ? Move to low-fat and fat-free milk or yogurt.  ? Vary your protein routine. - Meat, fish, poultry (chicken, [place]), eggs, beans (kidney, pinto), dairy.  ? Drink and eat less sodium, saturated fat, and added sugars.   Diet Recommendations  1. Eat at least 2-3 meals.  2. Limit starchy foods to TWO per meal and ONE per snack. ONE portion of a starchy  food is equal to the following:   - ONE slice of bread (or its equivalent, such as half of a hamburger bun).   - 1/2 cup of a "scoopable" starchy food such as potatoes or rice.   - 15 grams of Total Carbohydrate as shown on food label.  3. Include at every meal: a protein food, a carb food, and vegetables and/or fruit.   - Obtain twice the volume of vegetables as protein or carbohydrate foods for both lunch and dinner.   - Fresh or frozen vegetables are best.   - Keep frozen vegetables on hand for a quick vegetable serving.       Starchy (carb) foods: Bread, rice, pasta, potatoes, corn, cereal, grits, crackers, bagels, muffins, all baked goods.   (Fruits, milk, and yogurt also have carbohydrate, but most of these foods will not spike your blood sugar as most starchy foods will.)  A few fruits do cause high blood sugars; use small portions of bananas (limit to 1/2 at a time), grapes, watermelon, oranges, and most tropical fruits.    Protein foods: Meat, fish, poultry, eggs, dairy foods, and beans such as pinto and kidney beans (beans also provide carbohydrate).      Look for opportunities to move your body throughout your day:  Never lie down when you can sit; never sit when you can stand; never stand when you can pace.  Moving your body throughout the day is just as important as the 30 or 60 minutes of exercise at the gym!  Get social Get active with your friends instead of going out to eat. Go for a hike, walk around the mall, or play an exercise-themed video game.  Move more at work Fit more activity into the workday. Stand during phone calls, use a printer farther from your desk, and get up to stretch each hour.   Do something new Develop a new skill to kick-start your motivation. Sign up for a class to learn how to [employer]  dance, surf, do tai chi, or play a sport.  Keep cool in the pool Don't like to sweat? Hit the local community pool for a swim, water polo, or water aerobics class to stay cool while exercising.   Stay on track Use a fitness tracker (FITBIT, Fitness Pal mobile app) to track your activity and provide motivation to reach your goals.

## 2019-04-29 NOTE — Progress Notes (Signed)
    SUBJECTIVE:   CHIEF COMPLAINT: physical  HPI:   Patient is undergoing plastic surgery in the summer with liposuction, tummy tuck and fat transfer for [employer] and wants to make sure she is in the best health possible for this. She is actively trying to lose weight. She has had some issues with chronic back pain but states she has been able to get out and walk more with the weather being nicer. She is trying not to eat any fried foods and baking her foods and eating only lean meats. She is eating mostly fruits and vegetables.  Denies known FH of cardiac disease but mom had HLD.  Healthcare Maintenance - Vaccines: flu, declined - Colonoscopy: due - Mammogram: ordered - Pap Smear: UTD - DEXA Scan: n/a - A1c: due - Lipid Panel: due   PERTINENT  PMH / PSH: IBS, allergic urticaria, uterine leiomyoma, obesity  OBJECTIVE:   BP 118/74   Pulse 65   Ht 5\' 5"  (1.651 m)   Wt 213 lb (96.6 kg)   LMP 04/23/2019 (Exact Date)   BMI 35.45 kg/m   Gen: well appearing, obese female, in NAD Cardiac: regular rate Lungs: normal WOB  ASSESSMENT/PLAN:   Desire for weight loss Congratulated patient on efforts. Much counseling provided regarding diet and exercise. Recommended gradually increasing exercise. Encourage continued fruit and vegetable consumption.  Patient declines nutritionist referral. Will check lipid panel and A1c given obesity.   Health Maintenance Referred for colonoscopy. Ordered mammogram, a1c, lipid panel.   Rory Percy, [place]

## 2019-04-30 LAB — LIPID PANEL
Chol/HDL Ratio: 6.3 ratio — ABNORMAL HIGH (ref 0.0–4.4)
Cholesterol, Total: 165 mg/dL (ref 100–199)
HDL: 26 mg/dL — ABNORMAL LOW (ref >39)
LDL Chol Calc (NIH): 126 mg/dL — ABNORMAL HIGH (ref 0–99)
Triglycerides: 69 mg/dL (ref 0–149)
VLDL Cholesterol Cal: 13 mg/dL (ref 5–40)

## 2019-05-05 ENCOUNTER — Encounter: Payer: Self-pay | Admitting: Family Medicine

## 2019-07-14 ENCOUNTER — Encounter: Payer: Self-pay | Admitting: Family Medicine

## 2019-07-14 ENCOUNTER — Other Ambulatory Visit: Payer: Self-pay

## 2019-07-14 ENCOUNTER — Ambulatory Visit
Admission: RE | Admit: 2019-07-14 | Discharge: 2019-07-14 | Disposition: A | Discharge status: Home / Self Care | Payer: BLUE CROSS/BLUE SHIELD | Source: Ambulatory Visit | Attending: Family Medicine | Admitting: Family Medicine

## 2019-07-14 ENCOUNTER — Ambulatory Visit (HOSPITAL_COMMUNITY)
Admission: RE | Admit: 2019-07-14 | Discharge: 2019-07-14 | Disposition: A | Discharge status: Home / Self Care | Payer: BLUE CROSS/BLUE SHIELD | Source: Ambulatory Visit | Attending: Family Medicine | Admitting: Family Medicine

## 2019-07-14 ENCOUNTER — Ambulatory Visit (INDEPENDENT_AMBULATORY_CARE_PROVIDER_SITE_OTHER): Payer: BLUE CROSS/BLUE SHIELD | Admitting: Family Medicine

## 2019-07-14 VITALS — BP 138/82 | HR 103 | Ht 65.0 in | Wt 202.4 lb

## 2019-07-14 DIAGNOSIS — Z01818 Encounter for other preprocedural examination: Secondary | ICD-10-CM

## 2019-07-14 LAB — POCT URINALYSIS DIP (MANUAL ENTRY)
Bilirubin, UA: NEGATIVE
Blood, UA: NEGATIVE
Glucose, UA: NEGATIVE mg/dL
Ketones, POC UA: NEGATIVE mg/dL
Leukocytes, UA: NEGATIVE
Nitrite, UA: NEGATIVE
Protein Ur, POC: NEGATIVE mg/dL
Spec Grav, UA: 1.015 (ref 1.010–1.025)
Urobilinogen, UA: 0.2 E.U./dL
pH, UA: 6.5 (ref 5.0–8.0)

## 2019-07-14 NOTE — Patient Instructions (Addendum)
1. I will call or send a mychart message with results 2. Please call cardiology for further cardiac clearance, let them know your surgeon is requiring a stress test 3. We will fax over these forms to your surgeon.  4. Please be advised of the risks of surgery such as blood clots

## 2019-07-14 NOTE — Assessment & Plan Note (Signed)
Orders as above. EKG - normal EKG, normal sinus rhythm Lab work including CBC with differential, PT/INR, PTT, CMP, UA, beta hCG, HIV CXR ordered per surgeon's request. Pending the above workup, the patient is deemed low cardiac risk for the proposed procedure. Given need for stress test, requested by surgeon, have referred to cardiology
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
## 2019-07-15 ENCOUNTER — Telehealth: Payer: Self-pay | Admitting: Family Medicine

## 2019-07-15 ENCOUNTER — Encounter: Payer: Self-pay | Admitting: Family Medicine

## 2019-07-15 ENCOUNTER — Ambulatory Visit (INDEPENDENT_AMBULATORY_CARE_PROVIDER_SITE_OTHER): Payer: BLUE CROSS/BLUE SHIELD | Admitting: Cardiovascular Disease

## 2019-07-15 ENCOUNTER — Other Ambulatory Visit: Payer: Self-pay | Admitting: Family Medicine

## 2019-07-15 ENCOUNTER — Telehealth: Payer: Self-pay

## 2019-07-15 ENCOUNTER — Encounter: Payer: Self-pay | Admitting: Cardiovascular Disease

## 2019-07-15 VITALS — BP 132/92 | HR 81 | Ht 65.0 in | Wt 203.2 lb

## 2019-07-15 DIAGNOSIS — Z01810 Encounter for preprocedural cardiovascular examination: Secondary | ICD-10-CM

## 2019-07-15 LAB — CBC WITH DIFFERENTIAL/PLATELET
Basophils Absolute: 0.1 10*3/uL (ref 0.0–0.2)
Basos: 1 %
EOS (ABSOLUTE): 0.2 10*3/uL (ref 0.0–0.4)
Eos: 2 %
Hematocrit: 39.9 % (ref 34.0–46.6)
Hemoglobin: 13.3 g/dL (ref 11.1–15.9)
Immature Grans (Abs): 0 10*3/uL (ref 0.0–0.1)
Immature Granulocytes: 0 %
Lymphocytes Absolute: 3.8 10*3/uL — ABNORMAL HIGH (ref 0.7–3.1)
Lymphs: 31 %
MCH: 28.7 pg (ref 26.6–33.0)
MCHC: 33.3 g/dL (ref 31.5–35.7)
MCV: 86 fL (ref 79–97)
Monocytes Absolute: 0.8 10*3/uL (ref 0.1–0.9)
Monocytes: 7 %
Neutrophils Absolute: 7.3 10*3/uL — ABNORMAL HIGH (ref 1.4–7.0)
Neutrophils: 59 %
Platelets: 314 10*3/uL (ref 150–450)
RBC: 4.63 x10E6/uL (ref 3.77–5.28)
RDW: 14.1 % (ref 11.7–15.4)
WBC: 12.2 10*3/uL — ABNORMAL HIGH (ref 3.4–10.8)

## 2019-07-15 LAB — COMPREHENSIVE METABOLIC PANEL
ALT: 19 IU/L (ref 0–32)
AST: 17 IU/L (ref 0–40)
Albumin/Globulin Ratio: 1.5 (ref 1.2–2.2)
Albumin: 4.4 g/dL (ref 3.8–4.8)
Alkaline Phosphatase: 60 IU/L (ref 48–121)
BUN/Creatinine Ratio: 8 — ABNORMAL LOW (ref 9–23)
BUN: 6 mg/dL (ref 6–24)
Bilirubin Total: 0.6 mg/dL (ref 0.0–1.2)
CO2: 18 mmol/L — ABNORMAL LOW (ref 20–29)
Calcium: 9 mg/dL (ref 8.7–10.2)
Chloride: 103 mmol/L (ref 96–106)
Creatinine, Ser: 0.77 mg/dL (ref 0.57–1.00)
GFR calc Af Amer: 104 mL/min/{1.73_m2} (ref >59)
GFR calc non Af Amer: 90 mL/min/{1.73_m2} (ref >59)
Globulin, Total: 2.9 g/dL (ref 1.5–4.5)
Glucose: 84 mg/dL (ref 65–99)
Potassium: 3.9 mmol/L (ref 3.5–5.2)
Sodium: 139 mmol/L (ref 134–144)
Total Protein: 7.3 g/dL (ref 6.0–8.5)

## 2019-07-15 LAB — HIV ANTIBODY (ROUTINE TESTING W REFLEX): HIV Screen 4th Generation wRfx: NONREACTIVE

## 2019-07-15 LAB — PROTIME-INR
INR: 1 (ref 0.9–1.2)
Prothrombin Time: 11.2 s (ref 9.1–12.0)

## 2019-07-15 LAB — BETA HCG QUANT (REF LAB): hCG Quant: <1 m[IU]/mL

## 2019-07-15 LAB — APTT: aPTT: 28 s (ref 24–33)

## 2019-07-15 NOTE — Telephone Encounter (Signed)
Please inform patient that paperwork has been completed and labs with paperwork will be faxed to surgeon. She still needs to see cardiology for stress test  Caroline More, DO, PGY-3 [facility] 07/15/2019 8:39 AM

## 2019-07-15 NOTE — Telephone Encounter (Signed)
Called and LVM for patient.  Katelyn Brown, [place]

## 2019-07-15 NOTE — Telephone Encounter (Signed)
Please inform patient that we do not perform stress tests in our office. Any information regarding a stress test needs to directly be sent from cardiology to her surgeon. All other labs, cxr, and ekg were all sent with her clearance form to her surgeon. I placed all of this info in the "to be faxed" area of the front desk  Caroline More, [place], PGY-3 [facility] 07/15/2019 3:54 PM

## 2019-07-15 NOTE — Progress Notes (Signed)
[id number] Katelyn Brown   [id number]  [id number]  Primary Physician Rory Percy, DO Primary Cardiologist: Lorretta Harp MD FACP, [place], [place], [place]  HPI:  Katelyn Brown is a 49 y.o. mildly overweight single African-American female mother of 3 daughters, grandmother of 4 grandchildren referred by her PCP for preoperative clearance before elective "BBL" surgery scheduled to be performed at [place] in July.  She has no cardiac risk factors.  There is no family history for heart disease.  She is never had heart attack or stroke.  She denies chest pain or shortness of breath.  She did have an uncomplicated laparoscopic cholecystectomy 7 years ago.  She had a negative GXT 09/27/2016.   Current Meds  Medication Sig  . Ascorbic Acid (VITAMIN C PO) Take by mouth daily.  . cyclobenzaprine (FLEXERIL) 10 MG tablet Take 1 tablet (10 mg total) by mouth 3 (three) times daily as needed for muscle spasms.  . Ferrous Sulfate (IRON PO) Take by mouth daily.  Marland Kitchen FOLIC ACID PO Take by mouth daily.  . Magnesium Hydroxide (DULCOLAX PO) Take by mouth every Sunday.  Marland Kitchen MILK THISTLE PO Take by mouth daily.  Marland Kitchen OVER THE COUNTER MEDICATION daily. Milk Thistle     No Known Allergies  Social History   Socioeconomic History  . Marital status: Single    Spouse name: Not on file  . Number of children: Not on file  . Years of education: Not on file  . Highest education level: Not on file  Occupational History  . Not on file  Tobacco Use  . Smoking status: Never Smoker  . Smokeless tobacco: Never Used  Substance and Sexual Activity  . Alcohol use: Yes    Alcohol/week: 0.0 standard drinks    Comment: Occasional- socially on holidays  . Drug use: Never  . Sexual activity: Yes    Partners: Male    Birth control/protection: Surgical  Other Topics Concern  . Not on file  Social History Narrative   Lives with self. Owns a car.   Would want [name] to medical decisions for  her if she was unable to do so.   Enjoys spending time with grandkids and traveling for fun   Social Determinants of Radio broadcast assistant Strain:   . Difficulty of Paying Living Expenses:   Food Insecurity:   . Worried About Charity fundraiser in the Last Year:   . Arboriculturist in the Last Year:   Transportation Needs:   . Film/video editor (Medical):   Marland Kitchen Lack of Transportation (Non-Medical):   Physical Activity:   . Days of Exercise per Week:   . Minutes of Exercise per Session:   Stress:   . Feeling of Stress :   Social Connections:   . Frequency of Communication with Friends and Family:   . Frequency of Social Gatherings with Friends and Family:   . Attends Religious Services:   . Active Member of Clubs or Organizations:   . Attends Archivist Meetings:   Marland Kitchen Marital Status:   Intimate Partner Violence:   . Fear of Current or Ex-Partner:   . Emotionally Abused:   Marland Kitchen Physically Abused:   . Sexually Abused:      Review of Systems: General: negative for chills, fever, night sweats or weight changes.  Cardiovascular: negative for chest pain, dyspnea on exertion, edema, orthopnea, palpitations, paroxysmal nocturnal dyspnea or shortness of breath Dermatological: negative for rash  Respiratory: negative for cough or wheezing Urologic: negative for hematuria Abdominal: negative for nausea, vomiting, diarrhea, bright red blood per rectum, melena, or hematemesis Neurologic: negative for visual changes, syncope, or dizziness All other systems reviewed and are otherwise negative except as noted above.    Blood pressure (!) 132/92, pulse 81, height 5\' 5"  (1.651 m), weight 203 lb 3.2 oz (92.2 kg), last menstrual period 06/18/2019, SpO2 97 %.  General appearance: alert and no distress Neck: no adenopathy, no carotid bruit, no JVD, supple, symmetrical, trachea midline and thyroid not enlarged, symmetric, no tenderness/mass/nodules Lungs: clear to auscultation  bilaterally Heart: regular rate and rhythm, S1, S2 normal, no murmur, click, rub or gallop Extremities: extremities normal, atraumatic, no cyanosis or edema Pulses: 2+ and symmetric Skin: Skin color, texture, turgor normal. No rashes or lesions Neurologic: Alert and oriented X 3, normal strength and tone. Normal symmetric reflexes. Normal coordination and gait  EKG sinus rhythm at 81 without ST or T wave changes.  Personally reviewed this EKG.  ASSESSMENT AND PLAN:   Pre-op evaluation Ms. Hertzberg presents today for preoperative clearance prior to elective "BBL" surgery scheduled to be performed in July in [place].  This is outpatient surgery.  She has no cardiac risk factors.  She had an uncomplicated laparoscopic cholecystectomy 7 years ago.  She had negative GXT 3 years ago.  She is completely asymptomatic.  I am clearing her at low risk.      Lorretta Harp MD FACP,FACC,FAHA, [facility] 07/15/2019 2:58 PM

## 2019-07-15 NOTE — Assessment & Plan Note (Signed)
Katelyn Brown presents today for preoperative clearance prior to elective "BBL" surgery scheduled to be performed in July in [place].  This is outpatient surgery.  She has no cardiac risk factors.  She had an uncomplicated laparoscopic cholecystectomy 7 years ago.  She had negative GXT 3 years ago.  She is completely asymptomatic.  I am clearing her at low risk.

## 2019-07-15 NOTE — Telephone Encounter (Signed)
Spoke with pt about stress test. I informed her that when they did the EKG at Cardiology. If they found any reason for her to have a stress test they would have scheduled it while she was there. Pt also stated that there is a clearance form that her doctor should have to clear her to have her BBL. Pt wanted to make sure that they part on her form about the stress test would be filled out. I told her I would send a note to her doctor as well. Pt also wanted a call about her labs results and when to come pick up her surgical clearance form. Salvatore Marvel, CMA

## 2019-07-15 NOTE — Telephone Encounter (Signed)
Please inform patient that her lab work was normal. I placed her clearance form with all required lab work in the "to be faxed" area of our front desk  If patient needs a copy can we leave her a copy of the paperwork I completed up front after we fax it?   Dalphine Handing, PGY-3 [facility] 07/15/2019 3:52 PM

## 2019-07-15 NOTE — Patient Instructions (Signed)
Medication Instructions:  NO CHANGES *If you need a refill on your cardiac medications before your next appointment, please call your pharmacy*   Follow-Up: At [facility], you and your health needs are our priority.  As part of our continuing mission to provide you with exceptional heart care, we have created designated Provider Care Teams.  These Care Teams include your primary Cardiologist (physician) and Advanced Practice Providers (APPs -  Physician Assistants and Nurse Practitioners) who all work together to provide you with the care you need, when you need it.  We recommend signing up for the patient portal called "MyChart".  Sign up information is provided on this After Visit Summary.  MyChart is used to connect with patients for Virtual Visits (Telemedicine).  Patients are able to view lab/test results, encounter notes, upcoming appointments, etc.  Non-urgent messages can be sent to your provider as well.   To learn more about what you can do with MyChart, go to [email or web address].    Your next appointment:   AS NEEDED  You have been cleared for surgery with no further cardiac testing necessary.

## 2019-07-16 NOTE — Telephone Encounter (Signed)
2nd attempt to reach pt. No answer. LVM of below note again. Salvatore Marvel, CMA

## 2019-07-16 NOTE — Telephone Encounter (Signed)
Attempted to reach pt. No answer. LVM of note. I also informed through voice message. That pt would need to contact cardiology as well,  so that they can send all the test and labs they may have done to her surgeon as well. Will try again later today. Salvatore Marvel, CMA

## 2019-07-21 ENCOUNTER — Telehealth: Payer: Self-pay | Admitting: Cardiovascular Disease

## 2019-07-21 ENCOUNTER — Telehealth: Payer: Self-pay

## 2019-07-21 NOTE — Telephone Encounter (Signed)
   Pt called she said her surgeon still needs records of pt's stress test and ekg for her upcoming procedure  Please advise

## 2019-07-21 NOTE — Telephone Encounter (Signed)
Attempted to reach patient to clarify the name of surgeon. No answer/ Lvm

## 2019-07-21 NOTE — Telephone Encounter (Signed)
Patient calls nurse line stating her medical clearance information was not received by Dr. Emelia Salisbury office for cosmetic surgery. I have pulled this from fax, however the fax machine is broken. These are at my desk to be refaxed once machine is up and running.   [phone]

## 2019-07-22 NOTE — Telephone Encounter (Signed)
Attempted to call [facility] in [place] to see if her recent EKG and previous Treadmill stress test in 2018 are what they were looking for. Unfortunately, their clearance department has left for the day, will attempt again tomorrow. [phone]

## 2019-07-22 NOTE — Telephone Encounter (Signed)
Follow up  Pt called to follow up, her surgeon's name is Dr. Pearline Cables plastic surgeon [facility]. She got an email that they need recent EKG and stress test result. She understand that Dr. Gwenlyn Found said she doesn't need it but the surgeon is looking for it.  Please advise

## 2019-07-22 NOTE — Telephone Encounter (Signed)
Forms refaxed today to Dr. Emelia Salisbury office.

## 2019-07-23 NOTE — Telephone Encounter (Signed)
Made 3 attempts at calling the Clearance department of [facility], unable to reach staff.

## 2019-07-23 NOTE — Telephone Encounter (Signed)
Made a 4th attempt at calling Katelyn Brown clearance department. The phone continued to ring for 5 min without anyone to pick it up. No way to leave voice mail either

## 2019-07-26 ENCOUNTER — Telehealth: Payer: Self-pay | Admitting: Cardiovascular Disease

## 2019-07-26 DIAGNOSIS — Z01810 Encounter for preprocedural cardiovascular examination: Secondary | ICD-10-CM

## 2019-07-26 NOTE — Telephone Encounter (Signed)
Patient is calling and stating that the plastic surgeons office needs a more recent stress test than the one done in 2018. Patient would like Dr. Gwenlyn Found to order a stress test. Fax number for Dr. Earl Lites Office [phone]

## 2019-07-26 NOTE — Telephone Encounter (Signed)
Spoke with pt, the surgeon's office is requesting an updated stress test for surgery. Will forward to dr berry for the type of stress test appropriate as the surgeon did not specify.

## 2019-07-26 NOTE — Telephone Encounter (Signed)
Pre-op covering staff, can we please try get a fax number that I can send most recent EKG and stress test to?  Thank you!

## 2019-07-27 NOTE — Telephone Encounter (Signed)
See note below. Surgeon'Brown office has requested a more recent stress test than 2018. This request has been sent to Katelyn Brown for approval (see telephone encounter from yesterday 07/26/19). Will close this encounter.     Katelyn Estimable, RN   07/26/19 5:09 PM Note   Spoke with pt, the surgeon'Brown office is requesting an updated stress test for surgery. Will forward to dr berry for the type of stress test appropriate as the surgeon did not specify.        07/26/19 5:03 PM Katelyn Brown routed this conversation to [facility] Triage  Katelyn Brown  07/26/19 5:03 PM Note   Patient is calling and stating that the plastic surgeons office needs a more recent stress test than the one done in 2018. Patient would like Katelyn Brown to order a stress test. Fax number for Dr. Earl Lites Office [phone]

## 2019-07-27 NOTE — Telephone Encounter (Signed)
Spoke with pt, aware order placed for lexiscan and someone will call to schedule.

## 2019-07-27 NOTE — Telephone Encounter (Signed)
Order Carlton Adam MV for pre op Surgical clearance

## 2019-08-04 ENCOUNTER — Telehealth (HOSPITAL_COMMUNITY): Payer: Self-pay

## 2019-08-04 NOTE — Telephone Encounter (Signed)
Encounter complete. 

## 2019-08-06 ENCOUNTER — Other Ambulatory Visit: Payer: Self-pay

## 2019-08-06 ENCOUNTER — Ambulatory Visit (HOSPITAL_COMMUNITY)
Admission: RE | Admit: 2019-08-06 | Discharge: 2019-08-06 | Disposition: A | Discharge status: Home / Self Care | Payer: BLUE CROSS/BLUE SHIELD | Source: Ambulatory Visit | Attending: Cardiovascular Disease | Admitting: Cardiovascular Disease

## 2019-08-06 DIAGNOSIS — Z01810 Encounter for preprocedural cardiovascular examination: Secondary | ICD-10-CM | POA: Insufficient documentation

## 2019-08-06 LAB — MYOCARDIAL PERFUSION IMAGING
LV dias vol: 107 mL (ref 46–106)
LV sys vol: 45 mL
Peak HR: 117 {beats}/min
Rest HR: 73 {beats}/min
SDS: 2
SRS: 0
SSS: 2
TID: 1.24

## 2019-08-06 MED ORDER — TECHNETIUM TC 99M TETROFOSMIN IV KIT
30.2000 | PACK | Freq: Once | INTRAVENOUS | Status: AC | PRN
  Administered 2019-08-06: 30.2 via INTRAVENOUS
  Filled 2019-08-06: qty 31

## 2019-08-06 MED ORDER — REGADENOSON 0.4 MG/5ML IV SOLN
0.4000 mg | Freq: Once | INTRAVENOUS | Status: AC
  Administered 2019-08-06: 0.4 mg via INTRAVENOUS

## 2019-08-06 MED ORDER — TECHNETIUM TC 99M TETROFOSMIN IV KIT
10.4000 | PACK | Freq: Once | INTRAVENOUS | Status: AC | PRN
  Administered 2019-08-06: 10.4 via INTRAVENOUS
  Filled 2019-08-06: qty 11

## 2019-08-09 NOTE — Telephone Encounter (Signed)
I have faxed most recent stress test to Dr. Earl Lites office [phone].   Covering staff, please make sure it has been received.

## 2019-08-09 NOTE — Telephone Encounter (Signed)
Tried to call requesting office though no answer. I will re-fax notes as well. I will remove from the pre op call back pool.

## 2019-08-10 ENCOUNTER — Telehealth: Payer: Self-pay

## 2019-08-10 NOTE — Telephone Encounter (Signed)
Patient requested recent myoview report and ekg be faxed to surgeon Dr.Gray.08/06/19 myoview and 07/15/19 ekg faxed to Dr.Gray at fax # [phone].

## 2019-11-02 ENCOUNTER — Other Ambulatory Visit: Payer: Self-pay

## 2019-11-02 ENCOUNTER — Ambulatory Visit (INDEPENDENT_AMBULATORY_CARE_PROVIDER_SITE_OTHER): Payer: BLUE CROSS/BLUE SHIELD | Admitting: Family Medicine

## 2019-11-02 ENCOUNTER — Encounter: Payer: Self-pay | Admitting: Family Medicine

## 2019-11-02 ENCOUNTER — Inpatient Hospital Stay (HOSPITAL_COMMUNITY): Admit: 2019-11-02 | Payer: BLUE CROSS/BLUE SHIELD

## 2019-11-02 VITALS — BP 122/70 | Ht 65.0 in | Wt 204.0 lb

## 2019-11-02 DIAGNOSIS — N898 Other specified noninflammatory disorders of vagina: Secondary | ICD-10-CM | POA: Diagnosis not present

## 2019-11-02 DIAGNOSIS — Z1159 Encounter for screening for other viral diseases: Secondary | ICD-10-CM

## 2019-11-02 DIAGNOSIS — R8290 Unspecified abnormal findings in urine: Secondary | ICD-10-CM

## 2019-11-02 LAB — POCT URINALYSIS DIP (MANUAL ENTRY)
Bilirubin, UA: NEGATIVE
Blood, UA: NEGATIVE
Glucose, UA: NEGATIVE mg/dL
Ketones, POC UA: NEGATIVE mg/dL
Nitrite, UA: NEGATIVE
Protein Ur, POC: NEGATIVE mg/dL
Spec Grav, UA: 1.025 (ref 1.010–1.025)
Urobilinogen, UA: 0.2 E.U./dL
pH, UA: 5.5 (ref 5.0–8.0)

## 2019-11-02 LAB — POCT WET PREP (WET MOUNT)
Clue Cells Wet Prep Whiff POC: POSITIVE
Trichomonas Wet Prep HPF POC: ABSENT

## 2019-11-02 MED ORDER — METRONIDAZOLE 500 MG PO TABS: 500.0000 mg | ORAL_TABLET | Freq: Two times a day (BID) | ORAL | 0 refills | Status: DC

## 2019-11-02 NOTE — Assessment & Plan Note (Addendum)
Assessment: 49 year old female with 2 weeks of vaginal discharge and a change in odor after getting back with a previous sexual partner.  Patient denies discomfort in the vaginal area but states that she does have concerns about STI versus Candida or BV.  Wet prep performed which showed clue cells, no yeast. Plan: -Wet prep performed as above, will treat for bacterial vaginosis with metronidazole 500 mg twice daily for 7 days. -We will check GC/chlamydia, as well as RPR and HIV

## 2019-11-02 NOTE — Assessment & Plan Note (Signed)
Assessment: 49 year old female that presents after having a liposuction/gluteal plasty surgery in July and complaining of a pinkish tint to her urine since that time.  Patient states that right after she had her surgery she started her menstrual cycle and initially thought that the change in her urine color may be related to this, however she says that the change in urine color persisted after her menstrual cycle resolved.  Urinalysis performed today shows cloudy urine yellow in color with trace leukocytes and negative red blood cells.  Patient does not complain of any dysuria, urinary frequency, or suprapubic discomfort.  No concerns for urinary tract infection at this time. Plan: -Urinalysis performed as above, no RBCs noted, trace leukocytes noted but no suprapubic discomfort, urinary frequency, or dysuria, so no indication for antibiotic treatment at this time. -Recommended patient to continue to monitor if she develops any symptoms again to make a follow-up appointment for repeat urinalysis.

## 2019-11-02 NOTE — Patient Instructions (Signed)
It was great to see you!  Our plans for today:  -Today we discussed the change in urine color as well as your concern for vaginal discharge. -We performed a urinalysis which was yellow in color, did not show any red blood cells or any signs of an obvious infection of your urinary tract.  If you develop any other concerns with this do not hesitate to return we can recheck your urine. -We performed a wet prep which showed signs of bacterial vaginosis. I am sending in an antibiotic called metronidazole to take for 1 week to treat this. -We are also checking for gonorrhea/chlamydia/syphilis/HIV, as well as hepatitis C.  I will call you and let you know the results of these test when they are abnormal.  If they are normal I will send you a MyChart message  We are checking some labs today, I will call you if they are abnormal will send you a MyChart message or a letter if they are normal.  If you do not hear about your labs in the next 2 weeks please let us know.  Take care and seek immediate care sooner if you develop any concerns.   Dr. [facility]

## 2019-11-02 NOTE — Progress Notes (Signed)
    SUBJECTIVE:   CHIEF COMPLAINT / HPI:    Follow-up-surgery from July: Urine abnormality: Patient is a 49 year old female that presents today for follow-up after her liposuction/gluteoplasty surgery in July.  Patient states that since her surgery she noticed a pinkish tint to her urine.  She says this started right after the surgery at the same time that her menstrual cycle began.  She states that though her menstrual cycles are typically very regular and resolved this pinkish hue to her urine continued making her concerned that maybe it was something related to her surgery.  She denies dysuria or other concerns with regard to urination.  She states that she is not currently on her menstrual cycle.  STD screen Patient states that for about the past 2 weeks she noticed a whitish vaginal discharge and a change in smell.  She states that she recently got back with a previous sexual partner and did not use a condom during this encounter.  This occurred just before her noted the vaginal discharge making her concern for possible STI versus Candida infection as she was on antibiotics after her surgery.  Presents today for STD screening.  PERTINENT  PMH / PSH: Patient with a liposuction/gluteal plasty surgery in July  OBJECTIVE:   BP 122/70   Ht 5\' 5"  (1.651 m)   Wt 204 lb (92.5 kg)   LMP 10/06/2019 (Exact Date)   BMI 33.95 kg/m    General: NAD, pleasant, able to participate in exam Cardiac: RRR, no murmurs. Respiratory: CTAB, normal effort Abdomen: Bowel sounds present, non-distended, no suprapubic discomfort Pelvic exam: VULVA: normal appearing vulva with no masses, tenderness or lesions, VAGINA: no tenderness, no masses or lesions, vaginal discharge - white and creamy  ASSESSMENT/PLAN:   Vaginal discharge Assessment: 49 year old female with 2 weeks of vaginal discharge and a change in odor after getting back with a previous sexual partner.  Patient denies discomfort in the vaginal area  but states that she does have concerns about STI versus Candida or BV.  Wet prep performed which showed clue cells, no yeast. Plan: -Wet prep performed as above, will treat for bacterial vaginosis with metronidazole 500 mg twice daily for 7 days. -We will check GC/chlamydia, as well as RPR and HIV  Urine abnormality Assessment: 49 year old female that presents after having a liposuction/gluteal plasty surgery in July and complaining of a pinkish tint to her urine since that time.  Patient states that right after she had her surgery she started her menstrual cycle and initially thought that the change in her urine color may be related to this, however she says that the change in urine color persisted after her menstrual cycle resolved.  Urinalysis performed today shows cloudy urine yellow in color with trace leukocytes and negative red blood cells.  Patient does not complain of any dysuria, urinary frequency, or suprapubic discomfort.  No concerns for urinary tract infection at this time. Plan: -Urinalysis performed as above, no RBCs noted, trace leukocytes noted but no suprapubic discomfort, urinary frequency, or dysuria, so no indication for antibiotic treatment at this time. -Recommended patient to continue to monitor if she develops any symptoms again to make a follow-up appointment for repeat urinalysis.   Will screen for hepatitis C as well per recommendations   Lurline Del, [place]

## 2019-11-03 LAB — RPR: RPR Ser Ql: NONREACTIVE

## 2019-11-03 LAB — HEPATITIS C ANTIBODY: Hep C Virus Ab: 0.2 s/co ratio (ref 0.0–0.9)

## 2019-11-03 LAB — HIV ANTIBODY (ROUTINE TESTING W REFLEX): HIV Screen 4th Generation wRfx: NONREACTIVE

## 2019-11-04 LAB — CERVICOVAGINAL ANCILLARY ONLY
Chlamydia: NEGATIVE
Comment: NEGATIVE
Comment: NORMAL
Neisseria Gonorrhea: NEGATIVE

## 2019-12-28 ENCOUNTER — Other Ambulatory Visit: Payer: Self-pay

## 2019-12-28 ENCOUNTER — Ambulatory Visit (INDEPENDENT_AMBULATORY_CARE_PROVIDER_SITE_OTHER): Payer: Self-pay | Admitting: Student in an Organized Health Care Education/Training Program

## 2019-12-28 DIAGNOSIS — N898 Other specified noninflammatory disorders of vagina: Secondary | ICD-10-CM

## 2019-12-28 NOTE — Progress Notes (Signed)
   SUBJECTIVE:   CHIEF COMPLAINT / HPI: skin tags  Patient completed Lipo 360, and BBL July 22nd. Follow up going well. faja (body wrap) on continuously from procedure.  Patient coming in today for two "skin tags" on her labia. She thinks that one got irritated and torn off from the [place] prior to this appointment. The other one has been present possibly for several years but she never really noticed it until she started wearing the faja. Denies any erythema, swelling, infectious symptoms. They do not drain or hurt at baseline. Just sometimes get irritated from her clothing. She aggressively wants removed today.  OBJECTIVE:   BP 138/80   Pulse 90   Wt 202 lb 3.2 oz (91.7 kg)   SpO2 98%   BMI 33.65 kg/m   General: NAD Pelvic: external genitals observed through opening of faja as patient declined to remove the material. Patient pointed out the lesion in question which was on left labia majora. Approximately 61mm diameter and mobile subcutaneous tissue consistent with bartholin cyst. Non-tender to palpation, no erythema or increased warmth. No other lesions were pointed out to me and none observed. Foul odor present.  Skin: warm and dry, no rashes noted Neuro: alert and oriented, no focal deficits Psych: Normal affect and mood  ASSESSMENT/PLAN:   Vaginal cyst Patient has a very benign looking cyst which is likely a bartholin cyst based on history and exam. Other likely differentials include epidermal inclusion cyst, cyst of the canal of nuck. At any rate, this is not inflamed or infected appearing. The lesion is subcutaneous so not amenable to "cutting off" which patient requested.  It is also not large enough to be able to aspirate.  My recommendation was warm compresses and watchful waiting.  Patient was extremely angered by this suggestion and stated she would go to someone who would cut it off then even if she had to do it herself.  I offered her education on the cyst multiple times but  patient was not agreeable to listening. She fled prior to AVS could be given.      [place]

## 2019-12-29 DIAGNOSIS — N898 Other specified noninflammatory disorders of vagina: Secondary | ICD-10-CM | POA: Insufficient documentation

## 2019-12-29 NOTE — Assessment & Plan Note (Signed)
Patient has a very benign looking cyst which is likely a bartholin cyst based on history and exam. Other likely differentials include epidermal inclusion cyst, cyst of the canal of nuck. At any rate, this is not inflamed or infected appearing. The lesion is subcutaneous so not amenable to "cutting off" which patient requested.  It is also not large enough to be able to aspirate.  My recommendation was warm compresses and watchful waiting.  Patient was extremely angered by this suggestion and stated she would go to someone who would cut it off then even if she had to do it herself.  I offered her education on the cyst multiple times but patient was not agreeable to listening. She fled prior to AVS could be given.

## 2020-01-18 ENCOUNTER — Ambulatory Visit (INDEPENDENT_AMBULATORY_CARE_PROVIDER_SITE_OTHER): Payer: Self-pay | Admitting: Family Medicine

## 2020-01-18 ENCOUNTER — Other Ambulatory Visit: Payer: Self-pay

## 2020-01-18 ENCOUNTER — Encounter: Payer: Self-pay | Admitting: Family Medicine

## 2020-01-18 DIAGNOSIS — L918 Other hypertrophic disorders of the skin: Secondary | ICD-10-CM

## 2020-01-18 NOTE — Patient Instructions (Signed)
It was great to see you! Thank you for allowing me to participate in your care!  Our plans for today:  -Today we discussed the skin tag that you had in your vaginal area, after discussion of this we performed a procedure to remove the skin tag. You may notice some discomfort over the next few days for this. You can use Tylenol as needed for the discomfort. You can use Vaseline to help minimize any discomfort as well. If you note any signs of infection such as pus or drainage, increasing pain, or other concerning symptoms do not hesitate to return   Take care and seek immediate care sooner if you develop any concerns.   Dr. Lurline Del, [place]

## 2020-01-18 NOTE — Progress Notes (Signed)
    SUBJECTIVE:   CHIEF COMPLAINT / HPI:   Skin tags: Patient is a pleasant 49 year old female that presents today for removal of skin tags. She states that her faja has been rubbing at this area and causing some discomfort. She states that the skin tag often gets aggravated by undergarments and so she wishes to have it removed as it is causing her pain. Patient denies other complaints at this time.  PERTINENT  PMH / PSH: None relevant  OBJECTIVE:   BP 132/80   Pulse 96   Ht 5\' 5"  (1.651 m)   Wt 201 lb 6 oz (91.3 kg)   LMP 01/03/2020   SpO2 99%   BMI 33.51 kg/m    General: NAD, pleasant, able to participate in exam Respiratory: No respiratory distress Skin: Small pedunculated skin tag present with 1 mm stalk on right side of labia majora  Procedure note: Informed consent was gathered and the consent form was completed. Risk and benefits were discussed in detail prior to the procedure. The skin tag in question was identified by both provider and patient. The area was cleaned with multiple alcohol wipes. The stalk of the skin tag was anesthetized with cryospray and the skin tag was removed with a single snap of sterile scissors. There was minimal blood loss. The area was covered with a small circular adhesive bandage. Patient tolerated the procedure well with no complications  ASSESSMENT/PLAN:   Skin tag Assessment: 49 year old female with 1/2 cm pedunculated skin tag present on the right labia majora with approximately a 1 mm stalk. Patient endorses this is constantly rubbing against her clothing causing pain and discomfort. Patient requesting this removed. Plan: -See procedure note above, skin tag was successfully removed -Discussed return precautions as well as wound care for the removal of the skin tag  Lurline Del, DO [place]    This note was prepared using Dragon voice recognition software and may include unintentional dictation errors due to  the inherent limitations of voice recognition software.

## 2020-01-18 NOTE — Assessment & Plan Note (Signed)
Assessment: 49 year old female with 1/2 cm pedunculated skin tag present on the right labia majora with approximately a 1 mm stalk. Patient endorses this is constantly rubbing against her clothing causing pain and discomfort. Patient requesting this removed. Plan: -See procedure note above, skin tag was successfully removed -Discussed return precautions as well as wound care for the removal of the skin tag

## 2020-01-27 ENCOUNTER — Telehealth: Payer: Self-pay | Admitting: Family Medicine

## 2020-01-27 NOTE — Telephone Encounter (Signed)
Complaint about her 12/28/19 visit. Feedback provided to the provider and I cycled back with the patient. Patient was appreciative of the call. F/U with PCP as planned.

## 2020-02-25 ENCOUNTER — Ambulatory Visit (INDEPENDENT_AMBULATORY_CARE_PROVIDER_SITE_OTHER): Payer: Self-pay | Admitting: Family Medicine

## 2020-02-25 ENCOUNTER — Other Ambulatory Visit: Payer: Self-pay

## 2020-02-25 ENCOUNTER — Encounter: Payer: Self-pay | Admitting: Family Medicine

## 2020-02-25 VITALS — BP 128/84 | HR 75 | Ht 65.0 in | Wt 202.0 lb

## 2020-02-25 DIAGNOSIS — L219 Seborrheic dermatitis, unspecified: Secondary | ICD-10-CM | POA: Insufficient documentation

## 2020-02-25 DIAGNOSIS — Z91018 Allergy to other foods: Secondary | ICD-10-CM

## 2020-02-25 MED ORDER — KETOCONAZOLE 2 % EX SHAM: MEDICATED_SHAMPOO | CUTANEOUS | 0 refills | Status: DC

## 2020-02-25 NOTE — Patient Instructions (Signed)
I have sent in a prescription for Nizoral shampoo.  You can use this on your eyebrows while in the shower daily until the rash resolves.  After that you can use it twice a week to keep the rash from coming back.  Trying to get it in your eyes.  If you do just wash them out with water.  For the tingling sensation in your lips, you may have a food allergy.  Try to eliminate foods that trigger the sensation from your diet.  You can also take over-the-counter Zyrtec or Claritin daily to help reduce the reaction.  If you would like we can refer you to a allergist.  Please let us know.  Have a great day,  Clemetine Marker, MD

## 2020-02-25 NOTE — Progress Notes (Signed)
    SUBJECTIVE:   CHIEF COMPLAINT / HPI:   Eyebrows: Had eyebrows waxed a few months Ago.  Immediately after she left they started itching.  Now it still itches.  She continues to have dry scaly skin on the eyebrows. She has been putting an astringent to stop itching and a cream as she does not member the name of..  Hasn't gotten any better.  This was the first time she had her eyebrows waxed at this location but has had her eyebrows previously waxed locations.   Lips: tingling in her lips every day.  Says it's 'not really bad'.  Doesn't interfere with eating/drinking. No hx of cold sores.  She takes zinc darting about two weeks ago to be healthy agasint covid. Doesn't eat beef or pork but otherwise no restriction on diet.  Has not had any recent facial procedures such as fillers or Botox and no dental procedures in the past few years.  She states that certain foods such as bananas or kiwi's make her lips feel worse.  PERTINENT  PMH / PSH: None  OBJECTIVE:   BP 128/84   Pulse 75   Ht 5\' 5"  (1.651 m)   Wt 202 lb (91.6 kg)   LMP 02/05/2020   SpO2 98%   BMI 33.61 kg/m   General: Alert and oriented.  No acute distress HEENT: No swelling of the lips.  No redness or lesions seen on the lips.  Normal oral mucosa.  Dry scaling skin around the eyebrows.  Sparsely of eyebrow hair.  Rest of the face is without rash or scaling.  Patient wearing a wig.  ASSESSMENT/PLAN:   Seborrheic dermatitis Exam appears consistent with seborrheic dermatitis.  Uncertain of what over-the-counter treatments patient has used previously, but will prescribe Nizoral 2% shampoo to use daily until rash improves then she can use twice weekly.  Food allergy Patient's tingling of her lips that is worse with certain foods, particularly fruits and spicy foods make food allergy most likely cause of her symptoms.  Exam normal and patient not taking any medications.  Unlikely to be mineral deficiency with patient not being on a  restricted diet.  Patient has history of allergic urticaria as well.  Advised patient to try a daily second-generation antihistamine and to eliminate foods that trigger her symptoms.  Offered referral to allergist if patient would desire one.  Patient states she will consider it.     Benay Pike, MD [place]

## 2020-02-25 NOTE — Assessment & Plan Note (Signed)
Patient's tingling of her lips that is worse with certain foods, particularly fruits and spicy foods make food allergy most likely cause of her symptoms.  Exam normal and patient not taking any medications.  Unlikely to be mineral deficiency with patient not being on a restricted diet.  Patient has history of allergic urticaria as well.  Advised patient to try a daily second-generation antihistamine and to eliminate foods that trigger her symptoms.  Offered referral to allergist if patient would desire one.  Patient states she will consider it.

## 2020-02-25 NOTE — Assessment & Plan Note (Signed)
Exam appears consistent with seborrheic dermatitis.  Uncertain of what over-the-counter treatments patient has used previously, but will prescribe Nizoral 2% shampoo to use daily until rash improves then she can use twice weekly.

## 2020-05-18 NOTE — Progress Notes (Signed)
    SUBJECTIVE:   CHIEF COMPLAINT / HPI:   Follow-up-"flaking of eyebrows": Patient is a 50 year old female presenting for follow-up after being evaluated on 02/25/2020 with concern for seborrheic dermatitis with eyebrows flaking at that time.  During that appointment she was prescribed Nizoral 2% shampoo to use daily until the rash improves and then to go to twice weekly.  She states that since then she noted some burning and irritation after starting to use the shampoo once only used it for a few days.  She states that her son had a steroid cream that he used for eczema and she tried a bit of this and it did seem to make a difference in her symptoms, however she only had a few days worth of it and has since ran out.  She states that this all started after having her eyebrows waxed sometime ago.  She states that this has been worsened over the past few weeks.Marland Kitchen  PERTINENT  PMH / PSH: None relevant  OBJECTIVE:   BP 132/80   Pulse 84   Ht 5\' 5"  (1.651 m)   Wt 195 lb 2 oz (88.5 kg)   LMP 05/21/2020   SpO2 99%   BMI 32.47 kg/m    General: NAD, pleasant, able to participate in exam Cardiac: RRR, no murmurs. Respiratory: CTAB, normal effort Skin: Area of dry scaly skin over both eyebrows with hair growing up through the skin.  No erythema or drainage. Psych: Normal affect and mood      ASSESSMENT/PLAN:   Seborrheic dermatitis Assessment: 50 year old with dry scaly skin over both of her eyebrows which has been present for several weeks/possibly months.  She states that she had her eyebrows waxed sometime ago and since then has had worsening scale and itchiness.  She has tried Nizoral shampoo which caused pain and burning sensation so she stopped this.  She also tried a steroid cream that her son had for eczema and this seemed to improve the symptoms but she only had a few days worth of it.  Differential can include seborrheic dermatitis versus a contact dermatitis.  Patient does use make-up  which she is used for some time but I do not want to change too many things at once. -We will prescribe triamcinolone ointment 0.1% which patient will use twice per day on her eyebrows for the next 10 days.  Patient will see [place] back in 10 days and will not use this steroid for longer than 10 days without getting the okay from a provider. -If the ointment does not seem to improve her symptoms then I expect a contact dermatitis which is resulting from her make-up or some other daily contact is likely the cause in which case we will try to reduce these contacts to determine what the issue is and may refer her to dermatology for further evaluation.     Lurline Del, [place]    This note was prepared using Dragon voice recognition software and may include unintentional dictation errors due to the inherent limitations of voice recognition software.

## 2020-05-18 NOTE — Patient Instructions (Signed)
It was great to see you! Thank you for allowing me to participate in your care!  I recommend that you always bring your medications to each appointment as this makes it easy to ensure we are on the correct medications and helps [place] not miss when refills are needed.  Our plans for today:  -I am prescribing a mild steroid ointment for you to use for the eyebrows.  On should use this twice per day for the next 7 to 10 days.  I would like for you make a follow-up appointment for a week and a half, about 10 days from now first evaluate how its worked.  I do not want you to use the steroid cream for longer than 10 days straight without seeing a provider first.  If you develop any other symptoms or causes a concern certainly see [place] back beforehand.   Take care and seek immediate care sooner if you develop any concerns.   Dr. Lurline Del, [place]

## 2020-05-23 ENCOUNTER — Other Ambulatory Visit: Payer: Self-pay

## 2020-05-23 ENCOUNTER — Ambulatory Visit (INDEPENDENT_AMBULATORY_CARE_PROVIDER_SITE_OTHER): Payer: Self-pay | Admitting: Family Medicine

## 2020-05-23 ENCOUNTER — Encounter: Payer: Self-pay | Admitting: Family Medicine

## 2020-05-23 DIAGNOSIS — L219 Seborrheic dermatitis, unspecified: Secondary | ICD-10-CM

## 2020-05-23 MED ORDER — TRIAMCINOLONE ACETONIDE 0.1 % EX OINT: 1.0000 "application " | TOPICAL_OINTMENT | Freq: Two times a day (BID) | CUTANEOUS | 0 refills | Status: DC

## 2020-05-23 NOTE — Assessment & Plan Note (Signed)
Assessment: 50 year old with dry scaly skin over both of her eyebrows which has been present for several weeks/possibly months.  She states that she had her eyebrows waxed sometime ago and since then has had worsening scale and itchiness.  She has tried Nizoral shampoo which caused pain and burning sensation so she stopped this.  She also tried a steroid cream that her son had for eczema and this seemed to improve the symptoms but she only had a few days worth of it.  Differential can include seborrheic dermatitis versus a contact dermatitis.  Patient does use make-up which she is used for some time but I do not want to change too many things at once. -We will prescribe triamcinolone ointment 0.1% which patient will use twice per day on her eyebrows for the next 10 days.  Patient will see [place] back in 10 days and will not use this steroid for longer than 10 days without getting the okay from a provider. -If the ointment does not seem to improve her symptoms then I expect a contact dermatitis which is resulting from her make-up or some other daily contact is likely the cause in which case we will try to reduce these contacts to determine what the issue is and may refer her to dermatology for further evaluation.

## 2020-07-05 NOTE — Progress Notes (Signed)
    SUBJECTIVE:   CHIEF COMPLAINT / HPI:   Vaginal Discharge: Patient is a 50 y.o. female presenting with vaginal discharge for several days.  She states the discharge is of whitish consistency.  She endorses no vaginal odor.  She states she recently started with a new sexual partner and she is interested in screening for sexually transmitted infections today.  She denies symptoms of dysuria.  States that she is not having any vaginal pain but feels as if she may have a yeast infection or something of that nature.  Health maintenance: Patient also requests getting caught up on some of her health maintenance items.  She states she has had family history of breast cancers and would like to get her mammogram which she is due for.  She also request getting her colonoscopy once this was discussed.  Patient also would like to be screened for type 2 diabetes with an A1c.  PERTINENT  PMH / PSH: None relevant  OBJECTIVE:   BP 135/86   Pulse 92   Ht 5\' 5"  (1.651 m)   Wt 191 lb 6.4 oz (86.8 kg)   LMP 05/31/2020   SpO2 97%   BMI 31.85 kg/m    General: NAD, pleasant, able to participate in exam Respiratory: Normal effort, no obvious respiratory distress Pelvic: VULVA: normal appearing vulva with no masses, tenderness or lesions, VAGINA: Normal appearing vagina with normal color, no lesions, with white discharge present, Chaperone Alexis present for pelvic exam  ASSESSMENT/PLAN:   Vaginal discharge Assessment:  50 y.o. female with vaginal discharge for several days, as well as whitish discharge.  Physical exam significant for whitish discharge.  Wet prep performed today shows yeastca consistent with candida.  Patient is interested in STI screening.   Plan: -Wet prep as above.  Will treat with fluconazole. -GC/chlamydia pending -Will check HIV and RPR    Will also order mammogram and colonoscopy per health maintenance.  We will check an A1c to screen for diabetes.  Lurline Del, [place]    This note was prepared using Dragon voice recognition software and may include unintentional dictation errors due to the inherent limitations of voice recognition software.

## 2020-07-05 NOTE — Patient Instructions (Signed)
It was great to see you! Thank you for allowing me to participate in your care!  I recommend that you always bring your medications to each appointment as this makes it easy to ensure we are on the correct medications and helps [place] not miss when refills are needed.  Our plans for today:  -We performed a wet prep and STD testing today.  The wet prep showed signs of yeast infection and so I have sent in a medication called fluconazole for you to take.. -The results of the STD testing should be back within the next 2 or 3 days. -I have placed a referral for GI for colonoscopy -You are also due for a mammogram, place the referral and we have given you a card to reach out to them to schedule it. -We are also checking a screening A1c for diabetes.  We are checking some labs today, I will call you if they are abnormal will send you a MyChart message or a letter if they are normal.  If you do not hear about your labs in the next 2 weeks please let us know.  Take care and seek immediate care sooner if you develop any concerns.   Dr. Lurline Del, [place]

## 2020-07-06 ENCOUNTER — Encounter: Payer: Self-pay | Admitting: Family Medicine

## 2020-07-06 ENCOUNTER — Ambulatory Visit (INDEPENDENT_AMBULATORY_CARE_PROVIDER_SITE_OTHER): Payer: Self-pay | Admitting: Family Medicine

## 2020-07-06 ENCOUNTER — Other Ambulatory Visit (HOSPITAL_COMMUNITY)
Admission: RE | Admit: 2020-07-06 | Discharge: 2020-07-06 | Disposition: A | Discharge status: Home / Self Care | Payer: Medicaid Other | Source: Ambulatory Visit | Attending: Family Medicine | Admitting: Family Medicine

## 2020-07-06 ENCOUNTER — Other Ambulatory Visit: Payer: Self-pay

## 2020-07-06 VITALS — BP 135/86 | HR 92 | Ht 65.0 in | Wt 191.4 lb

## 2020-07-06 DIAGNOSIS — Z113 Encounter for screening for infections with a predominantly sexual mode of transmission: Secondary | ICD-10-CM

## 2020-07-06 DIAGNOSIS — Z1211 Encounter for screening for malignant neoplasm of colon: Secondary | ICD-10-CM | POA: Diagnosis not present

## 2020-07-06 DIAGNOSIS — Z1231 Encounter for screening mammogram for malignant neoplasm of breast: Secondary | ICD-10-CM

## 2020-07-06 DIAGNOSIS — Z131 Encounter for screening for diabetes mellitus: Secondary | ICD-10-CM

## 2020-07-06 DIAGNOSIS — N898 Other specified noninflammatory disorders of vagina: Secondary | ICD-10-CM

## 2020-07-06 LAB — POCT WET PREP (WET MOUNT)
Clue Cells Wet Prep Whiff POC: NEGATIVE
Trichomonas Wet Prep HPF POC: ABSENT

## 2020-07-06 MED ORDER — KETOCONAZOLE 2 % EX SHAM: MEDICATED_SHAMPOO | CUTANEOUS | 0 refills | Status: DC

## 2020-07-06 MED ORDER — FLUCONAZOLE 150 MG PO TABS: 150.0000 mg | ORAL_TABLET | Freq: Once | ORAL | 0 refills | Status: AC

## 2020-07-06 NOTE — Assessment & Plan Note (Signed)
Assessment:  50 y.o. female with vaginal discharge for several days, as well as whitish discharge.  Physical exam significant for whitish discharge.  Wet prep performed today shows yeastca consistent with candida.  Patient is interested in STI screening.   Plan: -Wet prep as above.  Will treat with fluconazole. -GC/chlamydia pending -Will check HIV and RPR

## 2020-07-07 LAB — RPR: RPR Ser Ql: NONREACTIVE

## 2020-07-07 LAB — HIV ANTIBODY (ROUTINE TESTING W REFLEX): HIV Screen 4th Generation wRfx: NONREACTIVE

## 2020-07-07 LAB — HEMOGLOBIN A1C
Est. average glucose Bld gHb Est-mCnc: 105 mg/dL
Hgb A1c MFr Bld: 5.3 % (ref 4.8–5.6)

## 2020-07-07 LAB — CERVICOVAGINAL ANCILLARY ONLY
Chlamydia: NEGATIVE
Comment: NEGATIVE
Comment: NORMAL
Neisseria Gonorrhea: NEGATIVE

## 2020-10-18 ENCOUNTER — Ambulatory Visit: Payer: Medicaid Other

## 2021-02-18 DIAGNOSIS — N939 Abnormal uterine and vaginal bleeding, unspecified: Secondary | ICD-10-CM

## 2021-02-18 DIAGNOSIS — G8929 Other chronic pain: Secondary | ICD-10-CM

## 2021-02-18 DIAGNOSIS — N814 Uterovaginal prolapse, unspecified: Secondary | ICD-10-CM

## 2021-02-18 DIAGNOSIS — M25572 Pain in left ankle and joints of left foot: Secondary | ICD-10-CM

## 2021-02-18 HISTORY — DX: Abnormal uterine and vaginal bleeding, unspecified: N93.9

## 2021-02-18 HISTORY — DX: Other chronic pain: G89.29

## 2021-02-18 HISTORY — DX: Pain in left ankle and joints of left foot: M25.572

## 2021-02-18 HISTORY — DX: Uterovaginal prolapse, unspecified: N81.4

## 2021-03-01 ENCOUNTER — Emergency Department (HOSPITAL_COMMUNITY)
Admission: EM | Admit: 2021-03-01 | Discharge: 2021-03-02 | Disposition: A | Discharge status: Home / Self Care | Payer: PRIVATE HEALTH INSURANCE | Attending: Emergency Medicine | Admitting: Emergency Medicine

## 2021-03-01 ENCOUNTER — Other Ambulatory Visit: Payer: Self-pay

## 2021-03-01 DIAGNOSIS — H5711 Ocular pain, right eye: Secondary | ICD-10-CM | POA: Diagnosis present

## 2021-03-01 MED ORDER — HYDROCODONE-ACETAMINOPHEN 5-325 MG PO TABS
1.0000 | ORAL_TABLET | Freq: Once | ORAL | Status: AC
  Administered 2021-03-01: 1 via ORAL
  Filled 2021-03-01: qty 1

## 2021-03-01 NOTE — ED Provider Triage Note (Signed)
Emergency Medicine Provider Triage Evaluation Note  Katelyn Brown , a 51 y.o. female  was evaluated in triage.  Pt complains of right eye pain and swelling.  Patient feels like the contacts and last night.  When sugar this morning, was having eye irritation.  She tried to move her contact, but is not feeling ill came out.  Feels like something stuck in her eye.  She has tried eye drops without improvement.  Review of Systems  Positive:  R eye pain Negative: trauma  Physical Exam  BP (!) 157/110 (BP Location: Right Arm)    Pulse 89    Temp 97.9 F (36.6 C)    Resp 17    SpO2 96%  Gen:   Awake, no distress   Resp:  Normal effort  MSK:   Moves extremities without difficulty  Other:  Right eye swollen and tearing with clear drainage.  Medical Decision Making  Medically screening exam initiated at 10:40 PM.  Appropriate orders placed.  [name] was informed that the remainder of the evaluation will be completed by another provider, this initial triage assessment does not replace that evaluation, and the importance of remaining in the ED until their evaluation is complete.  Will need eye eval in room   Franchot Heidelberg, PA-C 03/01/21 [id number]

## 2021-03-01 NOTE — ED Triage Notes (Signed)
Pt reported to ED with c/o pain to right eye after sleeping with contact in. Pt states she was able to remove the lens but feels like there is something left in eye.

## 2021-03-02 ENCOUNTER — Other Ambulatory Visit: Payer: Self-pay

## 2021-03-02 MED ORDER — FLUORESCEIN SODIUM 1 MG OP STRP
1.0000 | ORAL_STRIP | Freq: Once | OPHTHALMIC | Status: AC
  Administered 2021-03-02: 1 via OPHTHALMIC
  Filled 2021-03-02: qty 1

## 2021-03-02 MED ORDER — TETRACAINE HCL 0.5 % OP SOLN
2.0000 [drp] | Freq: Once | OPHTHALMIC | Status: AC
  Administered 2021-03-02: 2 [drp] via OPHTHALMIC
  Filled 2021-03-02 (×2): qty 4

## 2021-03-02 MED ORDER — OFLOXACIN 0.3 % OP SOLN: 1.0000 [drp] | Freq: Four times a day (QID) | OPHTHALMIC | 0 refills | Status: AC

## 2021-03-02 NOTE — Discharge Instructions (Addendum)
Use the eyedrops 4 times a day for the next 5 days. Use ice to help with pain and swelling. Use Tylenol ibuprofen as needed for pain. If your symptoms are not significantly improved or completely resolved by Monday, follow-up with the ophthalmologist listed below. Return to the emergency room if you develop any new, worsening, or concerning symptoms.

## 2021-03-02 NOTE — ED Provider Notes (Signed)
[facility] EMERGENCY DEPARTMENT Provider Note   CSN: [id number] Arrival date & time: 03/01/21  2212     History  Chief Complaint  Patient presents with   Eye Problem    Katelyn Brown is a 51 y.o. female presenting for evaluation of right eye pain.  Patient states she fell asleep with her contacts in last night.  This morning when she removed her contacts, she does not feel she completely removed the contact of the right eye.  She has had pain and irritation of the right eye since, feels like there something stuck in it.  Her eye has become red and swollen.  She does not have an ophthalmologist that she sees regularly.  She has tried eyedrops without improvement of symptoms.  No trauma or injury.  No fevers or chills.  No vision loss.  HPI     Home Medications Prior to Admission medications   Medication Sig Start Date End Date Taking? Authorizing Provider  ofloxacin (OCUFLOX) 0.3 % ophthalmic solution Place 1 drop into the right eye 4 (four) times daily for 5 days. 03/02/21 03/07/21 Yes Leeloo Silverthorne, PA-C  Ascorbic Acid (VITAMIN C PO) Take by mouth daily.    [provider]  cyclobenzaprine (FLEXERIL) 10 MG tablet Take 1 tablet (10 mg total) by mouth 3 (three) times daily as needed for muscle spasms. 04/16/19   Kathrene Alu, MD  Ferrous Sulfate (IRON PO) Take by mouth daily.    [provider]  FOLIC ACID PO Take by mouth daily.    [provider]  ketoconazole (NIZORAL) 2 % shampoo Apply daily until rash resolves, then use twice weekly. 07/06/20   Lurline Del, DO  Magnesium Hydroxide (DULCOLAX PO) Take by mouth every Sunday.    [provider]  metroNIDAZOLE (FLAGYL) 500 MG tablet Take 1 tablet (500 mg total) by mouth 2 (two) times daily. 11/02/19   Lurline Del, DO  MILK THISTLE PO Take by mouth daily.    [provider]  OVER THE COUNTER MEDICATION daily. Milk Thistle    [provider]   triamcinolone ointment (KENALOG) 0.1 % Apply 1 application topically 2 (two) times daily. 05/23/20   Lurline Del, DO      Allergies    Patient has no known allergies.    Review of Systems   Review of Systems  Constitutional:  Negative for fever.  Eyes:  Positive for pain and redness.   Physical Exam Updated Vital Signs BP (!) 141/98 (BP Location: Left Arm)    Pulse 72    Temp 98.7 F (37.1 C) (Oral)    Resp 16    SpO2 100%  Physical Exam Vitals and nursing note reviewed.  Constitutional:      General: She is not in acute distress.    Appearance: She is well-developed.  HENT:     Head: Normocephalic and atraumatic.  Eyes:     Extraocular Movements: Extraocular movements intact.     Conjunctiva/sclera:     Right eye: Right conjunctiva is injected.     Pupils:     Right eye: No corneal abrasion or fluorescein uptake.     Comments: Mild swelling of the right eye.  Conjunctival injection.  Clear tearing No fluorescein stain uptake.  EOMI and PERRLA. Pain resolved with tetracaine drops.  Cardiovascular:     Rate and Rhythm: Normal rate.  Pulmonary:     Effort: Pulmonary effort is normal.  Abdominal:     General:  There is no distension.  Musculoskeletal:        General: Normal range of motion.     Cervical back: Normal range of motion.  Skin:    General: Skin is warm.     Findings: No rash.  Neurological:     Mental Status: She is alert and oriented to person, place, and time.    ED Results / Procedures / Treatments   Labs (all labs ordered are listed, but only abnormal results are displayed) Labs Reviewed - No data to display  EKG None  Radiology No results found.  Procedures Procedures    Medications Ordered in ED Medications  HYDROcodone-acetaminophen (NORCO/VICODIN) 5-325 MG per tablet 1 tablet (1 tablet Oral Given 03/01/21 2346)  fluorescein ophthalmic strip 1 strip (1 strip Right Eye Given 03/02/21 0459)  tetracaine (PONTOCAINE) 0.5 % ophthalmic  solution 2 drop (2 drops Right Eye Given 03/02/21 0459)    ED Course/ Medical Decision Making/ A&P                           Medical Decision Making   This patient presents to the ED for concern of R eye pain. This involves a number of treatment options, and is a complaint that carries with it a moderate risk of complications and morbidity.  The differential diagnosis includes corneal abrasion, bacterial infection, conjunctivitis.   Co morbidities:  Wears contacts  Tests:  No fluorescein stain uptake, indicating no corneal abrasion.  However pain completely resolved with tetracaine.  Dispostion:  After consideration of the diagnostic results and the patients response to treatment, I feel that the patent would benefit from eyedrops and outpatient management.  Symptoms likely due to inflammation and irritation of the eye due to prolonged contact use.  There is no abrasion on exam.  As her pain completely resolved with the tetracaine, irritation is likely only on the surface/conjunctive a, not pupillary or iris.  Discussed findings with patient.  Discussed symptomatic management and close follow-up with ophthalmology, information given.  At this time, patient appears safe for discharge.  Return precautions given.  Patient states she understands and agrees to plan  Final Clinical Impression(s) / ED Diagnoses Final diagnoses:  Eye pain, right    Rx / DC Orders ED Discharge Orders          Ordered    ofloxacin (OCUFLOX) 0.3 % ophthalmic solution  4 times daily        03/02/21 0501              Franchot Heidelberg, PA-C 03/02/21 0528    Ripley Fraise, MD 03/02/21 [phone]

## 2021-03-29 ENCOUNTER — Other Ambulatory Visit: Payer: Self-pay

## 2021-03-29 ENCOUNTER — Ambulatory Visit (INDEPENDENT_AMBULATORY_CARE_PROVIDER_SITE_OTHER): Payer: PRIVATE HEALTH INSURANCE | Admitting: Family Medicine

## 2021-03-29 ENCOUNTER — Encounter: Payer: Self-pay | Admitting: Family Medicine

## 2021-03-29 ENCOUNTER — Ambulatory Visit
Admission: RE | Admit: 2021-03-29 | Discharge: 2021-03-29 | Disposition: A | Discharge status: Home / Self Care | Payer: PRIVATE HEALTH INSURANCE | Source: Ambulatory Visit | Attending: Family Medicine | Admitting: Family Medicine

## 2021-03-29 DIAGNOSIS — M5441 Lumbago with sciatica, right side: Secondary | ICD-10-CM | POA: Diagnosis not present

## 2021-03-29 DIAGNOSIS — L219 Seborrheic dermatitis, unspecified: Secondary | ICD-10-CM | POA: Diagnosis not present

## 2021-03-29 DIAGNOSIS — M25551 Pain in right hip: Secondary | ICD-10-CM | POA: Diagnosis not present

## 2021-03-29 DIAGNOSIS — G8929 Other chronic pain: Secondary | ICD-10-CM

## 2021-03-29 MED ORDER — TRIAMCINOLONE ACETONIDE 0.1 % EX OINT: 1.0000 "application " | TOPICAL_OINTMENT | Freq: Every day | CUTANEOUS | 1 refills | Status: DC

## 2021-03-29 NOTE — Patient Instructions (Addendum)
We recommend COVID, flu, and shingles vaccine for you.  I am worried that you are rolling the dice and that it may catch up with you at some point.   You are also due for a colonoscopy for colon cancer screening and a mammogram for breast cancer screening.  I hope you find the time to do this.  You will need to let us know so that we can put the orders in. I refilled the cream for your face. I ordered xrays of your low back, pelvis and right hip.  Make an appointment with Dr. Vanessa [place] to go over those results.

## 2021-03-30 ENCOUNTER — Encounter: Payer: Self-pay | Admitting: Family Medicine

## 2021-03-30 NOTE — Assessment & Plan Note (Deleted)
M

## 2021-03-30 NOTE — Assessment & Plan Note (Signed)
Refilled triamcinolone and discussed problems of overuse.  She only uses once a day and uses it most days.

## 2021-03-30 NOTE — Assessment & Plan Note (Addendum)
Check back, hip and pelvic x rays.  She wanted a refill on flexeril.  I advised her to wait until xrays done and she returns for discussion.  Muscle relaxer is not first line treatment for primary osteo of hip, which is my leading suspect. Given her previous history, I am also concerned about lumbar pathology with radiculopathy.
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
## 2021-04-03 NOTE — Progress Notes (Signed)
° ° °  SUBJECTIVE:   CHIEF COMPLAINT / HPI:   Follow-up to discuss imaging-chronic right hip pain: 51 year old female presenting for follow-up after having imaging due to chronic right hip pain.  This occurred at an office visit on 03/29/2021.  At that time she had the pain radiating from her buttock to groin area, not radiating down the leg and history of previous back problems with right-sided radiculopathy.  Hip x-ray was performed with no acute abnormality.  Patient also had a lumbar spine x-ray which showed no acute abnormality.  Today she states she continues to have right sided low back and hip pain and thinks walking on concrete at her work is continuing to aggravate it. She did take a goody's powder for it. She has tried some ibuprofen and tylenol occasionally for this hip/back pain.  Hypertension: States she took a goody's powder for her back pain.  She does not have a history of hypertension.  Previous blood pressure 128/80 at last office visit.  PERTINENT  PMH / PSH: None relevant   OBJECTIVE:   BP (!) 150/102    Pulse 78    Ht 5\' 5"  (1.651 m)    Wt 200 lb 2 oz (90.8 kg)    LMP 04/04/2021    SpO2 100%    BMI 33.30 kg/m    General: NAD, pleasant, able to participate in exam Cardiac: RRR, no murmurs. Respiratory: CTAB, normal effort, No wheezes, rales or rhonchi MSK: She has some pain with internal rotation of the right hip, no pain with external rotation of the right hip.  No pain with flexion or extension of the hip.  She has no knee pain with palpation of the right or left knee and region of quadriceps tendon, patellar tendon, medial or lateral joint line.  She has negative straight leg raise testing bilaterally. Neuro: alert, no obvious focal deficits Psych: Normal affect and mood  ASSESSMENT/PLAN:   Low back strain: Present for few weeks which she thinks is due to her work.  No set trauma or injury.  She has not tried regular NSAIDs.  She has not yet tried physical therapy.  We  performed x-rays previously which did not show any acute abnormality and did not look like signs of osteoarthritis.  Discussed various options including referral for sports medicine, physical therapy, or trial of NSAIDs.  She opts for trial of NSAIDs.  We will give a shot of Toradol today and send her out with 4 additional days of meloxicam to start tomorrow.  She has no contraindications to these.  Her blood pressure is a bit elevated today but this is likely in the setting of Goody's powder use and her previous pressures have looked appropriate.  Elevated blood pressure reading: Blood pressure of 150/102.  Blood pressure recheck was similar.  Previous blood pressure of 128/80 at last office visit.  She does not have a history of hypertension.  This occurred in the setting of Goody's powder.  Recommended 1 week follow-up to recheck her blood pressure.  Recommended against using Goody's powder for pain.   Lurline Del, [place]

## 2021-04-04 ENCOUNTER — Other Ambulatory Visit: Payer: Self-pay

## 2021-04-04 ENCOUNTER — Ambulatory Visit (INDEPENDENT_AMBULATORY_CARE_PROVIDER_SITE_OTHER): Payer: PRIVATE HEALTH INSURANCE | Admitting: Family Medicine

## 2021-04-04 ENCOUNTER — Encounter: Payer: Self-pay | Admitting: Family Medicine

## 2021-04-04 VITALS — BP 146/109 | HR 78 | Ht 65.0 in | Wt 200.1 lb

## 2021-04-04 DIAGNOSIS — M5441 Lumbago with sciatica, right side: Secondary | ICD-10-CM

## 2021-04-04 DIAGNOSIS — R030 Elevated blood-pressure reading, without diagnosis of hypertension: Secondary | ICD-10-CM

## 2021-04-04 MED ORDER — KETOROLAC TROMETHAMINE 15 MG/ML IJ SOLN
15.0000 mg | Freq: Once | INTRAMUSCULAR | Status: AC
  Administered 2021-04-04: 15 mg via INTRAMUSCULAR

## 2021-04-04 MED ORDER — MELOXICAM 7.5 MG PO TABS: ORAL_TABLET | ORAL | 0 refills | Status: DC

## 2021-04-04 NOTE — Patient Instructions (Signed)
Today we gave you a shot of an anti-inflammatory.  Please do not take any other anti-inflammatories today.  I also sent in a prescription strength anti-inflammatory called meloxicam for you to start taking tomorrow.  Please do not start this today as you already have the anti-inflammatory from the injection.  I will see back in a week for blood pressure check.  Please stop taking the Goody's powder.  If your pain does not improve we can discuss a referral for physical therapy or sports medicine.

## 2021-04-05 ENCOUNTER — Other Ambulatory Visit: Payer: Self-pay

## 2021-06-02 NOTE — Progress Notes (Signed)
? ? ?SUBJECTIVE:  ? ?CHIEF COMPLAINT / HPI:  ? ?Abnormal uterine bleeding: ?51 year old female presenting with having two periods per month and irregular periods. She states it started in March and about 3 weeks later had a second period. She is having some occasional spotting between and states the periods are heavier than usual. She has been hot flashes. No unintentional weight loss. She states prior to this her periods were regular.  States that she is always had heavy periods but thinks that these may be a bit more heavy than prior.  She started having periods at age 40. Patient is up-to-date on Pap smears with most recent 1 previous Pap smear on 09/30/2017 with negative high-risk HPV, transformation zone absent.  Pap smears prior to this have been normal. ? ?"Cervix falling" ?First noticed it years ago. If she strains it comes almost to the opening of the vagina and she states she can push it back. States she saw an OB/gyn about it years ago and started some exercises. Has had 3 vaginal births. She notes some loss of urine with coughing and sneezing.  ? ?Urinary frequency: ?Patient states that she has been peeing more often and has had a little bit of suprapubic discomfort and is concerned that she may have a UTI.  She is also endorsed a change in urine smell.  She request checking her urine for signs of a UTI. ? ?PERTINENT  PMH / PSH: History of heavy uterine bleeding ? ?OBJECTIVE:  ? ?BP (!) 134/100   Pulse 93   Ht '5\' 5"'$  (1.651 m)   Wt 202 lb 8 oz (91.9 kg)   LMP 05/28/2021   SpO2 99%   BMI 33.70 kg/m?   ? ?General: NAD, pleasant, able to participate in exam ?Cardiac: RRR, no murmurs. ?Respiratory: CTAB, normal effort ?Abdomen: Bowel sounds present, mild discomfort to palpation in the suprapubic region ?GU: No labial or vaginal lesions noted, no blood or discharge noted in the vaginal canal, cervix without any lesions present.  When the patient coughs the cervix does move to approximately 2-3 cm from  the vaginal opening.  It does not extend past the vaginal opening.  Chaperone: Tishira ? ?ASSESSMENT/PLAN:  ? ?Abnormal uterine bleeding: ?Has been going on for the past 4-6 weeks.  Patient had 2 periods last month with them being about 3 months apart.  Prior to this she has been regular.  She has always had a history of heavy uterine bleeding but feels that this bleeding may be a bit more heavy than previous.she initiated menarche at age 18.  Has had Pap smears and is up-to-date with these with the most recent ones being normal as far back as I can see.  She is also experiencing hot flashes and other signs that she is undergoing menopause.  She has a history of fibroids which may be contributing to her uterine bleeding.  We will check a CBC today to look for signs of anemia.  Recommended she keep a diary of how long her periods are lasting, how often they are occurring for the next 2 months and follow-up in 2 months.   ? ?Urinary stress incontinence  pelvic organ prolapse stage II ?Patient notes that her cervix has been moving when sneezing and coughing.  Is also noticed some urinary incontinence when she sneezes or coughs.  Has had 3 vaginal childbirths.  On physical exam the cervix does move with coughing to approximately 2-3 cm shy of the vaginal opening.  There  is no lesions on it.  We will refer to gynecology for discussion of pessaries and other modalities.  Patient has tried exercises without significant benefit and is interested in discussing pessaries or other options..  ? ?Urinary frequency: ?Patient endorses urinary frequency for the past few days.  She is got some suprapubic discomfort on physical exam.  No dysuria.  She is also noted a change in urine smell.  Urinalysis performed showed no obvious signs of UTI with negative leukocytes and negative nitrite.. ? ? ?Lurline Del, DO ?[place]  ? ? ? ?

## 2021-06-04 ENCOUNTER — Encounter: Payer: Self-pay | Admitting: Family Medicine

## 2021-06-04 ENCOUNTER — Ambulatory Visit (INDEPENDENT_AMBULATORY_CARE_PROVIDER_SITE_OTHER): Payer: PRIVATE HEALTH INSURANCE | Admitting: Family Medicine

## 2021-06-04 VITALS — BP 134/100 | HR 93 | Ht 65.0 in | Wt 202.5 lb

## 2021-06-04 DIAGNOSIS — N939 Abnormal uterine and vaginal bleeding, unspecified: Secondary | ICD-10-CM | POA: Diagnosis not present

## 2021-06-04 DIAGNOSIS — R350 Frequency of micturition: Secondary | ICD-10-CM | POA: Diagnosis not present

## 2021-06-04 DIAGNOSIS — N819 Female genital prolapse, unspecified: Secondary | ICD-10-CM

## 2021-06-04 LAB — POCT URINALYSIS DIP (MANUAL ENTRY)
Bilirubin, UA: NEGATIVE
Glucose, UA: NEGATIVE mg/dL
Ketones, POC UA: NEGATIVE mg/dL
Leukocytes, UA: NEGATIVE
Nitrite, UA: NEGATIVE
Protein Ur, POC: NEGATIVE mg/dL
Spec Grav, UA: 1.025 (ref 1.010–1.025)
Urobilinogen, UA: 0.2 E.U./dL
pH, UA: 6.5 (ref 5.0–8.0)

## 2021-06-04 NOTE — Patient Instructions (Signed)
Your blood pressure is elevated and I would like you to check your pressures at home and return in about 1 week for blood pressure follow-up ? ?For your heavy uterine bleeding I would like for you to keep a diary of when you are having your periods and how long they are lasting for the next 2 months and then follow back up with me.  If you have any further concerns or changes please let me know.  I do not think we need to check blood work today.  There is good chance that this is due to you starting menopause. ? ?For the urinary stress incontinence and pelvic organ prolapse we are sending you to gynecology to discuss pessaries or other options to keep the cervix in place.  They should call you in the next 1 to 2 weeks to set up appointment if they do not please let me know. ? ?For your urinary frequency we checked your urine and it showed signs no signs of urinary tract infection.  I think we can continue to monitor your symptoms ?

## 2021-06-05 LAB — CBC
Hematocrit: 38.2 % (ref 34.0–46.6)
Hemoglobin: 12.9 g/dL (ref 11.1–15.9)
MCH: 29.3 pg (ref 26.6–33.0)
MCHC: 33.8 g/dL (ref 31.5–35.7)
MCV: 87 fL (ref 79–97)
Platelets: 340 10*3/uL (ref 150–450)
RBC: 4.41 x10E6/uL (ref 3.77–5.28)
RDW: 13.4 % (ref 11.7–15.4)
WBC: 13.9 10*3/uL — ABNORMAL HIGH (ref 3.4–10.8)

## 2021-06-14 ENCOUNTER — Ambulatory Visit (INDEPENDENT_AMBULATORY_CARE_PROVIDER_SITE_OTHER): Payer: Managed Care, Other (non HMO) | Admitting: Radiology

## 2021-06-14 ENCOUNTER — Ambulatory Visit: Payer: Managed Care, Other (non HMO) | Admitting: Radiology

## 2021-06-14 ENCOUNTER — Telehealth: Payer: Self-pay

## 2021-06-14 ENCOUNTER — Encounter: Payer: Self-pay | Admitting: Radiology

## 2021-06-14 VITALS — BP 126/88 | Ht 64.0 in | Wt 200.0 lb

## 2021-06-14 DIAGNOSIS — Z0289 Encounter for other administrative examinations: Secondary | ICD-10-CM

## 2021-06-14 DIAGNOSIS — N813 Complete uterovaginal prolapse: Secondary | ICD-10-CM

## 2021-06-14 DIAGNOSIS — N814 Uterovaginal prolapse, unspecified: Secondary | ICD-10-CM

## 2021-06-14 NOTE — Telephone Encounter (Signed)
urogyn referral ?Received: Today ?Kerry Dory, NP  [facility] Triage ?Urogyn referral, surgical consult, Stage 3 uterine prolapse.  ?

## 2021-06-14 NOTE — Progress Notes (Signed)
? ?  Katelyn Brown [birth year] [id number] ? ? ?History:  51 y.o. G5P3 here as a new patient with c/o pelvic organ prolapse. Has noticed it more over the past few months. Has to push her cervix back in often. She is still menstruating and reports periods are heavy and painful. 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 Has a known history of ovarian cysts, fibroids and pelvic congestion syndrome. She reports her babies were larger >9lbs.  ? ?Gynecologic History ?Patient's last menstrual period was 05/28/2021 (exact date). ?Period Cycle (Days): 28 ?Period Duration (Days): 6 ?Period Pattern: Regular ?Menstrual Flow: Heavy ?Dysmenorrhea: (!) Severe (moderate to severe) ?Dysmenorrhea Symptoms: Cramping ?Contraception/Family planning: tubal ligation ?Sexually active: yes, occasionally ?Last Pap: 07/06/20 ? ?Obstetric History ?OB History  ?Gravida Para Term Preterm AB Living  ?'5 3 3 '$ 0 2 3  ?SAB IAB Ectopic Multiple Live Births  ?1 1 0 0    ?  ?# Outcome Date GA Lbr Len/2nd Weight Sex Delivery Anes PTL Lv  ?5 SAB           ?4 IAB           ?3 Term     F Vag-Spont     ?2 Term      Vag-Spont     ?1 Term      Vag-Spont     ? ? ? ?The following portions of the patient's history were reviewed and updated as appropriate: allergies, current medications, past family history, past medical history, past social history, past surgical history, and problem list. ? ?Review of Systems ?Pertinent items noted in HPI and remainder of comprehensive ROS otherwise negative.  ? ?Past medical history, past surgical history, family history and social history were all reviewed and documented in the EPIC chart. ? ? ?Exam: ? ?Vitals:  ? 06/14/21 1452  ?BP: 126/88  ?Weight: 200 lb (90.7 kg)  ?Height: '5\' 4"'$  (1.626 m)  ? ?Body mass index is 34.33 kg/m?. ? ?General appearance:  Normal ?Abdominal ? Soft,nontender, without masses, guarding or rebound. ? Liver/spleen:  No organomegaly  noted ? Hernia:  None appreciated ? Skin ? Inspection:  Grossly normal ? ?Genitourinary  ? Inguinal/mons:  Normal without inguinal adenopathy ? External genitalia:  Normal appearing vulva with no masses, tenderness, or lesions ? BUS/Urethra/Skene's glands:  Normal without masses or exudate ? Vagina:  Normal appearing with normal color and discharge, no lesions ? Cervix:  protruding out of the vagina ? Uterus:  Normal in size, shape and contour.  Mobile, nontender ? Adnexa/parametria:   ?  Rt: Normal in size, without masses or tenderness. ?  Lt: Normal in size, without masses or tenderness. ? Anus and perineum: Normal ?  ?Patient informed chaperone available to be present for breast and pelvic exam. Patient has requested no chaperone to be present. Patient has been advised what will be completed during breast and pelvic exam.  ? ?Assessment/Plan:   ?1. Cystocele with third degree uterine prolapse ?Pt will return for pessary fitting. ?Will send to urogyn for surgical consult re: LAH, BSO and sling  ? ? ?Discussed SBE, colonoscopy and DEXA screening as directed/appropriate. Recommend 143mns of exercise weekly, including weight bearing exercise. Encouraged the use of seatbelts and sunscreen. ?Return in 1 year for annual or as needed.  ? ?CRubbie BattiestB WHNP-BC 3:22 PM 06/14/2021  ?

## 2021-06-20 ENCOUNTER — Ambulatory Visit (INDEPENDENT_AMBULATORY_CARE_PROVIDER_SITE_OTHER): Payer: Managed Care, Other (non HMO) | Admitting: Radiology

## 2021-06-20 VITALS — BP 138/102

## 2021-06-20 DIAGNOSIS — Z4689 Encounter for fitting and adjustment of other specified devices: Secondary | ICD-10-CM | POA: Diagnosis not present

## 2021-06-20 NOTE — Progress Notes (Signed)
? ?  Katelyn Brown [birth year] [id number] ? ? ?History:  51 y.o. G5P3 here as a new patient with c/o pelvic organ prolapse. Has noticed it more over the past few months. Has to push her cervix back in often. She is still menstruating and reports periods are heavy and painful. 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 Has a known history of ovarian cysts, fibroids and pelvic congestion syndrome. She reports her babies were larger >9lbs.  ? ?Gynecologic History ?Patient's last menstrual period was 06/19/2021 (exact date). ?  ?Contraception/Family planning: tubal ligation ?Sexually active: yes, occasionally ?Last Pap: 07/06/20 ? ?Obstetric History ?OB History  ?Gravida Para Term Preterm AB Living  ?'5 3 3 '$ 0 2 3  ?SAB IAB Ectopic Multiple Live Births  ?1 1 0 0    ?  ?# Outcome Date GA Lbr Len/2nd Weight Sex Delivery Anes PTL Lv  ?5 SAB           ?4 IAB           ?3 Term     F Vag-Spont     ?2 Term      Vag-Spont     ?1 Term      Vag-Spont     ? ? ? ?The following portions of the patient's history were reviewed and updated as appropriate: allergies, current medications, past family history, past medical history, past social history, past surgical history, and problem list. ? ?Review of Systems ?Pertinent items noted in HPI and remainder of comprehensive ROS otherwise negative.  ? ?Past medical history, past surgical history, family history and social history were all reviewed and documented in the EPIC chart. ? ? ?Exam: ? ?Vitals:  ? 06/20/21 1519  ?BP: (!) 138/102  ? ?There is no height or weight on file to calculate BMI. ? ? ? ?Genitourinary  ? Inguinal/mons:  Normal without inguinal adenopathy ? External genitalia:  Normal appearing vulva with no masses, tenderness, or lesions ? BUS/Urethra/Skene's glands:  Normal without masses or exudate ? Vagina:  Normal appearing with normal color and discharge, no lesions ? Cervix:  protruding out of the  vagina, did well with ring #6, tried moving and sitting/standing/jumping/bearing down with no problem. #5 was too small and came out with bearing down ? Uterus:  Normal in size, shape and contour.  Mobile, nontender ? Adnexa/parametria:   ?  Rt: Normal in size, without masses or tenderness. ?  Lt: Normal in size, without masses or tenderness. ? Anus and perineum: Normal ?  ?Patient informed chaperone available to be present for breast and pelvic exam. Patient has requested no chaperone to be present. Patient has been advised what will be completed during breast and pelvic exam.  ? ?Assessment/Plan:   ?1. Cystocele with third degree uterine prolapse ?Pessary ordered, #6 ring with support ?Urogyn referral pending ? ?Rubbie Battiest B WHNP-BC 3:59 PM 06/20/2021  ?

## 2021-06-26 NOTE — Telephone Encounter (Signed)
Referral order placed in Epic on 4.27.23. ?

## 2021-06-28 ENCOUNTER — Telehealth: Payer: Self-pay

## 2021-06-28 NOTE — Telephone Encounter (Signed)
Patient calls nurse line reporting elevated blood pressure.  ? ?Patient reports she was feeling unwell and went to pharmacy to check BP ~163/115. ? ?Patient reports headaches, fatigue, dizziness and vision changes. Patient reports, "I am seeing stars."  ? ?Patient advised to go to [facility] or ED for evaluation.  ? ?Patient agreed with plan.  ?

## 2021-07-06 ENCOUNTER — Telehealth: Payer: Self-pay | Admitting: *Deleted

## 2021-07-06 NOTE — Telephone Encounter (Signed)
Spoke with patient, advised pessary has arrived in office.  OV scheduled for 07/11/21 at 2pm with [name].  Patient verbalizes understanding and is agreeable.   Pessary to Katelyn Brown, CMA  Routing to provider for final review. Patient is agreeable to disposition. Will close encounter.

## 2021-07-11 ENCOUNTER — Ambulatory Visit: Payer: Managed Care, Other (non HMO) | Admitting: Radiology

## 2021-07-11 ENCOUNTER — Encounter: Payer: Self-pay | Admitting: Family Medicine

## 2021-07-11 ENCOUNTER — Ambulatory Visit
Admission: RE | Admit: 2021-07-11 | Discharge: 2021-07-11 | Disposition: A | Discharge status: Home / Self Care | Payer: PRIVATE HEALTH INSURANCE | Source: Ambulatory Visit | Attending: Family Medicine | Admitting: Family Medicine

## 2021-07-11 ENCOUNTER — Ambulatory Visit (INDEPENDENT_AMBULATORY_CARE_PROVIDER_SITE_OTHER): Payer: PRIVATE HEALTH INSURANCE | Admitting: Family Medicine

## 2021-07-11 VITALS — BP 141/82 | HR 83 | Ht 64.0 in | Wt 203.8 lb

## 2021-07-11 DIAGNOSIS — Z0289 Encounter for other administrative examinations: Secondary | ICD-10-CM

## 2021-07-11 DIAGNOSIS — M25572 Pain in left ankle and joints of left foot: Secondary | ICD-10-CM

## 2021-07-11 MED ORDER — CYCLOBENZAPRINE HCL 10 MG PO TABS: 10.0000 mg | ORAL_TABLET | Freq: Three times a day (TID) | ORAL | 0 refills | Status: DC | PRN

## 2021-07-11 NOTE — Patient Instructions (Signed)
Go get your left ankle xray'd at the addresses below:  [place]  In [facility]  [phone] Open ? Closes 5:30PM  Continue taking Motrin as needed and flexeril at night for additional pain relief.
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
## 2021-07-12 ENCOUNTER — Telehealth: Payer: Self-pay

## 2021-07-12 NOTE — Telephone Encounter (Signed)
Patient walked into [facility] requesting to pick up form stating work restrictions.   Patient stated she was seen yesterday and "someone" told her "it" was ready for pick up.   Will forward to provider who patient.

## 2021-07-13 NOTE — Telephone Encounter (Signed)
Form located and patient has been informed.

## 2021-07-15 ENCOUNTER — Encounter: Payer: Self-pay | Admitting: Family Medicine

## 2021-07-15 DIAGNOSIS — M25572 Pain in left ankle and joints of left foot: Secondary | ICD-10-CM | POA: Insufficient documentation

## 2021-07-15 NOTE — Assessment & Plan Note (Addendum)
Pt is a 51 yo female who presents with right ankle pain. Obtain xrays. Has history of previous injury to her foot several years ago. Pt request for workplace accommodation is reasonable to prevent re-injury. Able to stand for prolonged periods but avoid running/brisk walking. Take a break or sit every 4 hours. Paperwork completed and copy to be placed in the chart. Follow up in 6 weeks. Continue OTC analgesics. Pt agrees with plan.

## 2021-07-18 ENCOUNTER — Ambulatory Visit (INDEPENDENT_AMBULATORY_CARE_PROVIDER_SITE_OTHER): Payer: Managed Care, Other (non HMO) | Admitting: Radiology

## 2021-07-18 ENCOUNTER — Encounter: Payer: Self-pay | Admitting: Radiology

## 2021-07-18 VITALS — BP 156/112

## 2021-07-18 DIAGNOSIS — N813 Complete uterovaginal prolapse: Secondary | ICD-10-CM

## 2021-07-18 NOTE — Progress Notes (Signed)
   Katelyn Brown [birth year] [id number]   History:  51 y.o. G5P3 here for pessary placement. Ring #6 with support.  Gynecologic History Patient's last menstrual period was 07/10/2021.   Contraception/Family planning: tubal ligation Sexually active: yes, occasionally Last Pap: 07/06/20  Obstetric History OB History  Gravida Para Term Preterm AB Living  '5 3 3 '$ 0 2 3  SAB IAB Ectopic Multiple Live Births  1 1 0 0      # Outcome Date GA Lbr Len/2nd Weight Sex Delivery Anes PTL Lv  5 SAB           4 IAB           3 Term     F Vag-Spont     2 Term      Vag-Spont     1 Term      Vag-Spont        The following portions of the patient's history were reviewed and updated as appropriate: allergies, current medications, past family history, past medical history, past social history, past surgical history, and problem list.  Review of Systems Pertinent items noted in HPI and remainder of comprehensive ROS otherwise negative.   Past medical history, past surgical history, family history and social history were all reviewed and documented in the EPIC chart.   Exam:  Vitals:   07/18/21 1356  BP: (!) 156/112   There is no height or weight on file to calculate BMI.    Genitourinary   Inguinal/mons:  Normal without inguinal adenopathy  External genitalia:  Normal appearing vulva with no masses, tenderness, or lesions  BUS/Urethra/Skene's glands:  Normal without masses or exudate  Vagina:  Normal appearing with normal color and discharge, no lesions  Cervix:  protruding out of the vagina, did well with ring #6  Uterus:  Normal in size, shape and contour.  Mobile, nontender  Adnexa/parametria:     Rt: Normal in size, without masses or tenderness.   Lt: Normal in size, without masses or tenderness.  Anus and perineum: Normal   Patient informed chaperone available to be present for breast and pelvic exam. Patient has requested no chaperone to be present. Patient has been  advised what will be completed during breast and pelvic exam.   Assessment/Plan:   1. Cystocele with third degree uterine prolapse Pessary placed, #6 ring with support Care reviewed with patient Urogyn referral pending- has appt in September   Kerry Dory [facility] 2:41 PM 07/18/2021

## 2021-07-24 ENCOUNTER — Encounter: Payer: Self-pay | Admitting: *Deleted

## 2021-08-08 ENCOUNTER — Ambulatory Visit (INDEPENDENT_AMBULATORY_CARE_PROVIDER_SITE_OTHER): Payer: PRIVATE HEALTH INSURANCE | Admitting: Family Medicine

## 2021-08-08 ENCOUNTER — Encounter: Payer: Self-pay | Admitting: Family Medicine

## 2021-08-08 VITALS — BP 128/72 | HR 83 | Ht 65.0 in | Wt 204.0 lb

## 2021-08-08 DIAGNOSIS — R21 Rash and other nonspecific skin eruption: Secondary | ICD-10-CM

## 2021-08-08 DIAGNOSIS — M25572 Pain in left ankle and joints of left foot: Secondary | ICD-10-CM

## 2021-08-08 MED ORDER — TRIAMCINOLONE ACETONIDE 0.5 % EX OINT: 1.0000 | TOPICAL_OINTMENT | Freq: Two times a day (BID) | CUTANEOUS | 0 refills | Status: AC

## 2021-08-08 NOTE — Patient Instructions (Signed)
I would like you to do the physical therapy exercises we showed today at least twice a day for the next 4 weeks.  Follow-up if your ankle is worsening or not improving.  I also recommend you get diclofenac gel which is also called Voltaren gel to use 3-4 times per day on that ankle.  For the rash at your eyebrows I am prescribing a stronger steroid for you to use for the next 10 days.  It is important that you do not use this past 10 days without talking to a doctor.  If it is not improving your symptoms in 10 days we should consider getting a referral for dermatology.

## 2021-08-08 NOTE — Progress Notes (Signed)
    SUBJECTIVE:   CHIEF COMPLAINT / HPI:   Bilateral ankle pain: Previously saw Dr. Susa Simmonds in our clinic on 07/11/2021 with acute ankle pain.  X-rays performed at that time showed no acute fracture.  She had paperwork completed to have her avoid brisk walking or running and able to take a break reset every 4 hours and recommended follow-up in about 6 weeks with continuing over-the-counter analgesics.  Today she states she still has some pain in the left ankle.  Eyebrow rash: Has been present on and off for a long period of time.  She has used triamcinolone 0.1% daily without a lot of benefit.  This for started after having her eyebrows waxed a year or longer ago but has persisted.  She denies any regular make-ups or possible cause for this.  PERTINENT  PMH / PSH: None relevant  OBJECTIVE:   BP 128/72   Pulse 83   Ht '5\' 5"'$  (1.651 m)   Wt 204 lb (92.5 kg)   LMP 07/10/2021   SpO2 98%   BMI 33.95 kg/m    General: NAD, pleasant, able to participate in exam Respiratory: No respiratory distress Skin: Hyperpigmented area in the region of the eyebrows bilaterally with no erythema but there are some skin changes suggestive of chronic irritation and inflammation.  No open areas, no drainage MSK: Left ankle with mild amount of swelling on the medial aspect of the ankle.  No pain with palpation of the calcaneus, midfoot, or Achilles region.  No pain with eversion, inversion, dorsiflexion, plantarflexion.  Mild discomfort when palpating the medial aspect just below the medial malleolus.  Mild amount of swelling in that region without any overlying skin changes. Psych: Normal affect and mood  ASSESSMENT/PLAN:    Left ankle pain: Present since recent injury.  X-ray at that time was negative for fractures.  She continues to have some swelling and weakness in the region.  Discussed the benefits of physical therapy and demonstrated several exercises including doing the A, B, C's with that foot to  build mobility and strength.  She is going let us know if she would like to have formal physical therapy.  Also recommended a trial of Voltaren gel 3-4 times a day in that region.  Rash at eyebrows: Has been present for some time.  She denies using any current irritants in the region.  It did start after having her eyebrows waxed over a year ago.  She has used triamcinolone 0.1% without a lot of benefit.  We will give a trial of a higher potency triamcinolone 0.5% ointment for 10 days.  Recommend following up if this is not improved at that time.     Lurline Del, [place]

## 2021-08-28 ENCOUNTER — Ambulatory Visit (INDEPENDENT_AMBULATORY_CARE_PROVIDER_SITE_OTHER): Payer: Managed Care, Other (non HMO) | Admitting: Student

## 2021-08-28 ENCOUNTER — Encounter: Payer: Self-pay | Admitting: Student

## 2021-08-28 VITALS — BP 124/82 | HR 86 | Ht 65.0 in | Wt 201.6 lb

## 2021-08-28 DIAGNOSIS — M25572 Pain in left ankle and joints of left foot: Secondary | ICD-10-CM | POA: Diagnosis not present

## 2021-08-28 NOTE — Progress Notes (Signed)
    SUBJECTIVE:   CHIEF COMPLAINT / HPI: L ankle pain   Injury to ankle happened between March and April per patient. At work at [place] she had to run to up and down stairs carrying boxes and when she was coming down stairs at that time her ankle started burning and started swelling. Not sure what happened but she says it is still hurting. She says she doesn't remember rolling it. Has had normal foot XR since being seen in [facility] clinic twice.   Has been doing ABC's to build foot strength which has helped some however would like PT referral today.  Taking tyenol or motrin occasionally.  PERTINENT  PMH / PSH: None relevant  OBJECTIVE:   BP 124/82   Pulse 86   Ht '5\' 5"'$  (1.651 m)   Wt 201 lb 9.6 oz (91.4 kg)   SpO2 99%   BMI 33.55 kg/m   General: NAD, awake, alert, responsive to questions Head: Normocephalic atraumatic Respiratory:  no increased work of breathing MSK: swelling of left ankle on medial aspect still present. Pain with palpation of achilles region when palpation. Mild discomfort of medial malleolar region. Able to dorsiflex and plantarflex however dorsiflexion more limited. Everts and inverts foot without pain. No overlying skin changes.  ASSESSMENT/PLAN:   Acute left ankle pain Filled out reasonable accomodations form (in media tab) to avoid brisk walking/running at work and have break every 4 hours off legs. Swelling improved per patient but pain still present. Patient is actively looking to change jobs. Today pain is in Achilles region. Patient would be interested in referral to sports medicine to r/o small achilles tendon tear and what is causing length on pain in her ankle. She is interested in PT referral today. Continuing OTC analgesics prn.     Gerrit Heck, MD [place]

## 2021-08-28 NOTE — Patient Instructions (Signed)
It was great to see you! Thank you for allowing me to participate in your care!   I recommend that you always bring your medications to each appointment as this makes it easy to ensure we are on the correct medications and helps [place] not miss when refills are needed.  Our plans for today:  - I have signed the accommodations form for you - I have referred to sports med and PT   Take care and seek immediate care sooner if you develop any concerns. Please remember to show up 15 minutes before your scheduled appointment time!  Gerrit Heck, MD [place]

## 2021-08-29 NOTE — Assessment & Plan Note (Signed)
Filled out reasonable accomodations form (in media tab) to avoid brisk walking/running at work and have break every 4 hours off legs. Swelling improved per patient but pain still present. Patient is actively looking to change jobs. Today pain is in Achilles region. Patient would be interested in referral to sports medicine to r/o small achilles tendon tear and what is causing length on pain in her ankle. She is interested in PT referral today. Continuing OTC analgesics prn.

## 2021-08-30 NOTE — Progress Notes (Signed)
Patient signed ROI for Kristopher Oppenheim for accomodation paperwork.   Paperwork was faxed to [phone].   Copy made and placed in batch scanning.   Talbot Grumbling, RN

## 2021-09-05 ENCOUNTER — Ambulatory Visit: Payer: Managed Care, Other (non HMO) | Admitting: Family Medicine

## 2021-10-02 ENCOUNTER — Ambulatory Visit (INDEPENDENT_AMBULATORY_CARE_PROVIDER_SITE_OTHER): Payer: Managed Care, Other (non HMO) | Admitting: Student

## 2021-10-02 ENCOUNTER — Other Ambulatory Visit: Payer: Self-pay

## 2021-10-02 ENCOUNTER — Encounter: Payer: Self-pay | Admitting: Student

## 2021-10-02 VITALS — BP 154/94 | HR 81 | Wt 202.4 lb

## 2021-10-02 DIAGNOSIS — M25512 Pain in left shoulder: Secondary | ICD-10-CM | POA: Diagnosis not present

## 2021-10-02 DIAGNOSIS — G8929 Other chronic pain: Secondary | ICD-10-CM

## 2021-10-02 MED ORDER — NAPROXEN 500 MG PO TABS: 500.0000 mg | ORAL_TABLET | Freq: Two times a day (BID) | ORAL | 0 refills | Status: DC

## 2021-10-02 NOTE — Progress Notes (Incomplete)
   SUBJECTIVE:   CHIEF COMPLAINT / HPI:   Chief Complaint  Patient presents with   Arm Pain     Katelyn Brown is a 51 y.o. female here for ***   Pt reports ***    PERTINENT  PMH / PSH: reviewed and updated as appropriate   OBJECTIVE:   BP (!) 154/94   Pulse 81   Wt 91.8 kg   SpO2 100%   BMI 33.68 kg/m   ***  ASSESSMENT/PLAN:   No problem-specific Assessment & Plan notes found for this encounter.     Lyndee Hensen, DO PGY-3, [facility] 10/02/2021      {    This will disappear when note is signed, click to select method of visit    :1}

## 2021-10-02 NOTE — Progress Notes (Unsigned)
    SUBJECTIVE:   CHIEF COMPLAINT / HPI:   L Arm pain -for past 3 months, worsening -Sharp and shooting pain with movement but an ache when not moving -Starts in the neck ans shoots down posterior arm -Has tried Aleve and tylenol only when hurting very bad, not every day. They do help.  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 Respiratory: CTAB, normal effort, No wheezes, rales or rhonchi Abdomen: Bowel sounds present, nontender, nondistended, no hepatosplenomegaly. Extremities: no edema or cyanosis. Skin: warm and dry, no rashes noted Neuro: alert, no obvious focal deficits Psych: Normal affect and mood  ASSESSMENT/PLAN:   No problem-specific Assessment & Plan notes found for this encounter.     Dr. Precious Gilding, [place]    {    This will disappear when note is signed, click to select method of visit    :1}

## 2021-10-02 NOTE — Patient Instructions (Signed)
It was great to see you! Thank you for allowing me to participate in your care!  I recommend that you always bring your medications to each appointment as this makes it easy to ensure you are on the correct medications and helps [place] not miss when refills are needed.  Our plans for today:  - You do NOT need to take aspirin at this time - I sent in a prescription for Naproxen 500 mg, you can take this twice a day with a meal -You can also take Tylenol 1000 mg every 6 hours for pain  -Continue using topical therapies if they help -I ordered an x ray of your spine and shoulder. You can go have these done at [facility].  - Please return ASAP to discuss other concerns including blood pressure   Take care and seek immediate care sooner if you develop any concerns.   Dr. Precious Gilding, DO [facility]

## 2021-10-03 DIAGNOSIS — G8929 Other chronic pain: Secondary | ICD-10-CM | POA: Insufficient documentation

## 2021-10-03 NOTE — Assessment & Plan Note (Signed)
Unsure if this pain is due to nerve impingement, the way she describes the pain as sharp and shooting going from her neck down her arm sounds like it could be.  Could also be related to a tendinitis or rotator cuff issue from repetitive motion at work.  We will start with imaging and conservative pain medication. -X-ray of cervical spine and left shoulder -Naproxen 500 mg twice daily for 2 weeks -Tylenol 1 g every 6 hours as needed -Can continue using her topical therapies including Voltaren gel, over-the-counter patches if they help -If nothing is revealed on imaging and pain persist, can consider referral to sports medicine for further evaluation

## 2021-10-09 ENCOUNTER — Ambulatory Visit
Admission: RE | Admit: 2021-10-09 | Discharge: 2021-10-09 | Disposition: A | Discharge status: Home / Self Care | Payer: Managed Care, Other (non HMO) | Source: Ambulatory Visit | Attending: Family Medicine | Admitting: Family Medicine

## 2021-10-09 DIAGNOSIS — G8929 Other chronic pain: Secondary | ICD-10-CM

## 2021-10-10 ENCOUNTER — Telehealth: Payer: Self-pay | Admitting: Student

## 2021-10-10 NOTE — Telephone Encounter (Signed)
Called pt to discuss results of shoulder x ray which does not explain her shoulder pain. They did not do the x-ray of her cervical spine that was ordered. She will go back to have that done.  She is still experiencing the shoulder pain but never took the Naproxen prescribed because she doesn't like taking medications unless she feels she absolutely has to. Plan now is to get the cervical spine x-ray and pt will let me know if pain persists and if she wants further work up with referral to sports medicine.

## 2021-10-11 ENCOUNTER — Ambulatory Visit (INDEPENDENT_AMBULATORY_CARE_PROVIDER_SITE_OTHER): Payer: Managed Care, Other (non HMO) | Admitting: Student

## 2021-10-11 VITALS — BP 122/78 | HR 72 | Wt 203.8 lb

## 2021-10-11 DIAGNOSIS — G8929 Other chronic pain: Secondary | ICD-10-CM | POA: Diagnosis not present

## 2021-10-11 DIAGNOSIS — M25512 Pain in left shoulder: Secondary | ICD-10-CM

## 2021-10-11 NOTE — Assessment & Plan Note (Signed)
Patient continues to have left sided arm/shoulder pain located at her deltoid later surface and where the bicep meets the deltoid. Pain is constant. Patient had DG shoulder that showed some degenerative changes at [facility] joint. Patient has DG scheduled for cervical spine. On PE she had decreased ROM in left shoulder 2/2 pain, but strength and sensation were intact. Patient may be suffering from biceps tendonitis, or muslce strain. Will refer to Sports Medicine -Sport's medicine referral -Naproxen 500 mg BID for 2 weeks, as needed -Tylenol 1000 mg, TID, as needed

## 2021-10-11 NOTE — Progress Notes (Signed)
  SUBJECTIVE:   CHIEF COMPLAINT / HPI:   F/u shoulder pain, patient wanting Sport's Med referral  Shoulder pain Pain has been an issue for last two months, left side, also having weakness in the arm. Right arm is starting to hurt as well, so she favors her right side. Left arm feels heavy when she wakes. Describes the pain as an ache or sometimes feels like needles. Appreciates an achy soreness and weakness that feels like she's just started back working out at the gym. Pain located around bicep and tricep. Rates the pain as a 6-7/10. Not taking any pain meds, doesn't like taking medicine/forgets. Patient reports she will try the medicine.   HTN Not taking medicine. BP 122/78 today.   PERTINENT  PMH / PSH: Chronic arm pain, chronic ankle pain, chronic hip pain   OBJECTIVE:  BP 122/78   Pulse 72   Wt 203 lb 12.8 oz (92.4 kg)   LMP 10/07/2021   SpO2 100%   BMI 33.91 kg/m   General: NAD, pleasant, able to participate in exam Cardiac: RRR, no murmurs auscultated. Respiratory: CTAB, normal effort, no wheezes, rales or rhonchi Physical Exam Constitutional:      Appearance: Normal appearance.  Musculoskeletal:     Right shoulder: No swelling, deformity, effusion, tenderness or bony tenderness. Normal strength.     Left shoulder: No swelling, deformity, tenderness or bony tenderness. Decreased range of motion. Decreased strength.     Right upper arm: Tenderness present. No swelling, edema or deformity.     Left upper arm: Tenderness present. No swelling, edema, deformity or bony tenderness.     Cervical back: Tenderness present. No swelling, deformity, erythema, signs of trauma, rigidity or spasms. No pain with movement.  Neurological:     Mental Status: She is alert.     ASSESSMENT/PLAN:  Chronic left shoulder pain Patient continues to have left sided arm/shoulder pain located at her deltoid later surface and where the bicep meets the deltoid. Pain is constant. Patient had DG  shoulder that showed some degenerative changes at [facility] joint. Patient has DG scheduled for cervical spine. On PE she had decreased ROM in left shoulder 2/2 pain, but strength and sensation were intact. Patient may be suffering from biceps tendonitis, or muslce strain. Will refer to Sports Medicine -Sport's medicine referral -Naproxen 500 mg BID for 2 weeks, as needed -Tylenol 1000 mg, TID, as needed   Orders Placed This Encounter  Procedures   Ambulatory referral to Sports Medicine    Referral Priority:   Routine    Referral Type:   Consultation    Number of Visits Requested:   1   No orders of the defined types were placed in this encounter.  No follow-ups on file. '@SIGNNOTE'$ @

## 2021-10-11 NOTE — Patient Instructions (Addendum)
It was great to see you! Thank you for allowing me to participate in your care!  It looks like you have a muscular issue with your shoulder/arm (deltoid/bicep). We will place a referral to [facility] for today:  - Call Sport's Medicine and set-up an appointment  [address], [place], [place] [address]  [phone]  - Try the pain medicine  Naproxen 500 mg twice a day, for 2 weeks, as needed  Tylenol 1000 mg every 8 hours as needed.  - Get X-ray of neck (Cervical Spine)  Take care and seek immediate care sooner if you develop any concerns.   Dr. Holley Bouche, MD [place]

## 2021-11-01 ENCOUNTER — Ambulatory Visit (INDEPENDENT_AMBULATORY_CARE_PROVIDER_SITE_OTHER): Payer: Managed Care, Other (non HMO) | Admitting: Student

## 2021-11-01 DIAGNOSIS — M25572 Pain in left ankle and joints of left foot: Secondary | ICD-10-CM | POA: Diagnosis not present

## 2021-11-01 DIAGNOSIS — M7662 Achilles tendinitis, left leg: Secondary | ICD-10-CM

## 2021-11-01 DIAGNOSIS — M722 Plantar fascial fibromatosis: Secondary | ICD-10-CM | POA: Insufficient documentation

## 2021-11-01 NOTE — Assessment & Plan Note (Deleted)
Discussed OTC Voltaren gel for localized pain relief.

## 2021-11-01 NOTE — Assessment & Plan Note (Signed)
The patient's left foot plantar pain is consistent with plantar fasciitis based on HPI and physical exam.  Provided patient with a reference on [place] for purchase of planter fasciitis splint for use at night.  Instructed the patient to use a frozen water bottle to roll under plantar surface of foot twice daily as well.  Instructed patient to wear shoes with good arch support at work and outside the house, suggested purchasing special insoles online if needed.  Patient's Achilles tendon pain is consistent with Achilles tendinitis based on HPI and physical exam.  Provided patient with eccentric strengthening exercises for Achilles tendon and instructed her to do these excises daily at home.  Physical therapy would be preferable but patient is unfortunately unable to pursue extensive PT at this time due to cost. Discussed OTC Voltaren gel for localized pain relief across entire ankle.  Patient's request for work accommodations is reasonable and work form will be filled out similarly to previous versions of the same form with copy to be placed in patient's chart.

## 2021-11-01 NOTE — Patient Instructions (Addendum)
It was great meeting you today.  For your plantar fasciitis, use a frozen water bottle to roll under the bottom of your foot daily- twice daily. Also, you may wear a nighttime plantar fasciitis night splint which are available on [place]. Please wear shoes with arch support.  For your achilles tendonitis, please perform the home exercises twice daily for 30 seconds per stretch using the handout we gave you.  I will fill out your work paperwork for accommodations.   If you have any questions or concerns, please feel free to call the clinic.    Be well,  Dr. Orvis Brill [facility] [phone]

## 2021-11-01 NOTE — Progress Notes (Cosign Needed)
SUBJECTIVE:   CHIEF COMPLAINT / HPI:  Katelyn Brown is a 51 y.o. female with a past medical history of left ankle pain and hypertension presenting to the clinic with persistent left ankle pain and for assistance with a form for work accommodations.  Left ankle pain The patient reports that she has been experiencing left ankle pain while working at her job at [employer] where she has to carry heavy boxes and walk briskly or jog to keep up with job demands.  She also reports that with walking up stairs and stepping can be painful.  She indicates the sole of her foot and her Achilles tendon when asked where the pain is most significant.  She is currently taking naproxen 200 mg daily with only mild relief.  The patient presents a form from work for accommodations and expresses frustration with her job for continuing to place her in environments requiring brisk walking.  She requests that we fill out her form after this visit.  Chart review shows that the same form has been filled out twice before by our practice and patient reports that her job keeps requesting this document periodically, to her frustration.  The patient has a previous referral to sports med but found when she called her insurance that she would have to pay a co-pay of $100 per visit and decided not to schedule an appointment at this time.  She also has been unable to go to physical therapy due to cost as well.   PERTINENT  PMH / PSH:  Patient medications reviewed this visit. Current Meds  Medication Sig   acetaminophen (TYLENOL) 500 MG tablet Take 500 mg by mouth every 6 (six) hours as needed for mild pain.   amLODipine (NORVASC) 5 MG tablet Take 5 mg by mouth daily.   cyclobenzaprine (FLEXERIL) 10 MG tablet Take 1 tablet (10 mg total) by mouth 3 (three) times daily as needed for muscle spasms.   naproxen (NAPROSYN) 500 MG tablet Take 1 tablet (500 mg total) by mouth 2 (two) times daily with a meal.     PERTINENT SOCIAL HISTORY: Patient works as a Programme researcher, broadcasting/film/video at [employer], placing food items into passing containers on a conveyor belt and lifting these containers to carry them to and from the conveyor belt.  Her job periodically requires her to jog or walk briskly along the conveyor line.  OBJECTIVE:   BP 124/70   Pulse 70   Temp 98.4 F (36.9 C)   Ht '5\' 5"'$  (1.651 m)   Wt 202 lb 3.2 oz (91.7 kg)   LMP 11/01/2021 (Exact Date)   SpO2 100%   BMI 33.65 kg/m   General: Well-appearing, resting comfortably in chair,  Cardiovascular: Regular rate and rhythm. Normal S1/S2. No murmurs, rubs, or gallops appreciated. 2+ radial and pedal pulses bilaterally. Pulmonary: Clear bilaterally to ascultation. No increased WOB on room air. Skin: Warm and dry. MSK:  Tenderness to palpation over left Achilles tendon and plantar surface of left foot. Mild left-sided limp with normal gait. Plantar flexion of left foot elicits moderate discomfort, otherwise full ROM of left foot and leg. Extremities: No peripheral edema bilaterally. Extremities warm and well-perfused bilaterally.   ASSESSMENT/PLAN:  [name] is a 51 y.o. female with a past medical history of left ankle pain and hypertension presenting to the clinic for with left ankle pain concerning for plantar fasciitis and achilles tendinitis.  Acute left ankle pain The patient's left foot plantar pain is consistent  with plantar fasciitis based on HPI and physical exam.  Provided patient with a reference on [employer] for purchase of planter fasciitis splint for use at night.  Instructed the patient to use a frozen water bottle to roll under plantar surface of foot twice daily as well.  Instructed patient to wear shoes with good arch support at work and outside the house, suggested purchasing special insoles online if needed.  Patient's Achilles tendon pain is consistent with Achilles tendinitis based on HPI and physical exam.  Provided  patient with eccentric strengthening exercises for Achilles tendon and instructed her to do these excises daily at home.  Physical therapy would be preferable but patient is unfortunately unable to pursue extensive PT at this time due to cost. Discussed OTC Voltaren gel for localized pain relief across entire ankle.  Patient's request for work accommodations is reasonable and work form will be filled out similarly to previous versions of the same form with copy to be placed in patient's chart.  Terricka Onofrio Mining engineer, [place]    I was personally present and performed or re-performed the history, physical exam and medical decision making activities of this service and have verified that the service and findings are accurately documented in the student's note.  Orvis Brill, DO                  11/05/2021, 10:40 AM

## 2021-11-01 NOTE — Assessment & Plan Note (Deleted)
The patient's left foot plantar pain is consistent with plantar fasciitis.  Provided patient with a reference on [place] for purchase of planter fasciitis splint for use at night.  Instructed the patient to use a frozen water bottle to roll under plantar surface of foot twice daily as well.  Discussed OTC Voltaren gel for localized pain relief.  Patient's request for work accommodations is reasonable and work form will be filled out similarly to previous versions of the same form with copy to be placed in patient's chart.

## 2021-11-02 ENCOUNTER — Ambulatory Visit (INDEPENDENT_AMBULATORY_CARE_PROVIDER_SITE_OTHER): Payer: Managed Care, Other (non HMO) | Admitting: Obstetrics and Gynecology

## 2021-11-02 ENCOUNTER — Encounter: Payer: Self-pay | Admitting: Obstetrics and Gynecology

## 2021-11-02 VITALS — BP 152/97 | HR 71 | Ht 63.75 in | Wt 198.0 lb

## 2021-11-02 DIAGNOSIS — N812 Incomplete uterovaginal prolapse: Secondary | ICD-10-CM | POA: Diagnosis not present

## 2021-11-02 DIAGNOSIS — N939 Abnormal uterine and vaginal bleeding, unspecified: Secondary | ICD-10-CM | POA: Diagnosis not present

## 2021-11-02 DIAGNOSIS — N8110 Cystocele, unspecified: Secondary | ICD-10-CM

## 2021-11-02 DIAGNOSIS — N393 Stress incontinence (female) (male): Secondary | ICD-10-CM | POA: Diagnosis not present

## 2021-11-02 DIAGNOSIS — R350 Frequency of micturition: Secondary | ICD-10-CM | POA: Diagnosis not present
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
## 2021-11-08 ENCOUNTER — Ambulatory Visit
Admission: RE | Admit: 2021-11-08 | Discharge: 2021-11-08 | Disposition: A | Discharge status: Home / Self Care | Payer: Managed Care, Other (non HMO) | Source: Ambulatory Visit | Attending: Obstetrics and Gynecology | Admitting: Obstetrics and Gynecology

## 2021-11-08 DIAGNOSIS — N939 Abnormal uterine and vaginal bleeding, unspecified: Secondary | ICD-10-CM | POA: Insufficient documentation

## 2021-11-12 ENCOUNTER — Ambulatory Visit: Payer: Managed Care, Other (non HMO) | Admitting: Obstetrics and Gynecology

## 2021-11-20 ENCOUNTER — Telehealth: Payer: Self-pay | Admitting: Student

## 2021-11-20 NOTE — Telephone Encounter (Signed)
Patient walked in to request the status of the [employer]mod[employer] Healthcare provider questionnaire.  She needs the original copy and to know if it was faxed. Please call her at Ph # [phone] A copy example has been placed in the doctors box.

## 2021-11-27 ENCOUNTER — Other Ambulatory Visit (HOSPITAL_COMMUNITY)
Admission: RE | Admit: 2021-11-27 | Discharge: 2021-11-27 | Disposition: A | Discharge status: Home / Self Care | Payer: Managed Care, Other (non HMO) | Source: Ambulatory Visit | Attending: Obstetrics and Gynecology | Admitting: Obstetrics and Gynecology

## 2021-11-27 ENCOUNTER — Encounter: Payer: Self-pay | Admitting: Obstetrics and Gynecology

## 2021-11-27 ENCOUNTER — Ambulatory Visit: Payer: Managed Care, Other (non HMO) | Admitting: Obstetrics and Gynecology

## 2021-11-27 VITALS — BP 156/111 | HR 86

## 2021-11-27 DIAGNOSIS — N812 Incomplete uterovaginal prolapse: Secondary | ICD-10-CM

## 2021-11-27 DIAGNOSIS — N816 Rectocele: Secondary | ICD-10-CM | POA: Diagnosis not present

## 2021-11-27 DIAGNOSIS — N921 Excessive and frequent menstruation with irregular cycle: Secondary | ICD-10-CM

## 2021-11-27 DIAGNOSIS — N8110 Cystocele, unspecified: Secondary | ICD-10-CM | POA: Diagnosis not present

## 2021-11-27 HISTORY — PX: ENDOMETRIAL BIOPSY: SHX622

## 2021-11-27 NOTE — Progress Notes (Signed)
[facility] Return Visit  SUBJECTIVE  History of Present Illness: Katelyn Brown is a 51 y.o. female seen in follow-up for for simple CMG and endometrial biopsy. She wants to proceed with surgery for her prolapse.     Past Medical History: Patient  has a past medical history of Abnormality of cervix (04/08/2014), Allergy, Alopecia, Anxiety, Carpal tunnel syndrome, Female pelvic congestion syndrome (03/01/2013), Headache(784.0), Hemorrhoid (2006), Hypertension, IBS (irritable bowel syndrome), Low back pain (11/19/2015), Neck pain, Ovarian cyst (2004), Seasonal allergies (07/26/2011), STD (female), and Uterine fibroid.   Past Surgical History: She  has a past surgical history that includes Facial fracture surgery (1993); Tubal ligation (1996); Eye surgery; Fracture surgery; Cholecystectomy (N/A, 03/15/2013); Embolization; and Liposuction.   Medications: She has a current medication list which includes the following prescription(s): acetaminophen, amlodipine, cyclobenzaprine, ferrous sulfate, naproxen, and triamcinolone ointment.   Allergies: Patient has No Known Allergies.   Social History: Patient  reports that she has never smoked. She has never used smokeless tobacco. She reports current alcohol use. She reports that she does not use drugs.      OBJECTIVE     Physical Exam: Vitals:   11/27/21 1453  BP: (!) 156/111  Pulse: 86   Gen: No apparent distress, A&O x 3.  Detailed Urogynecologic Evaluation:  Deferred. Prior exam showed:  POP-Q:    POP-Q   -1                                            Aa   -1                                           Ba   1                                              C    4.5                                            Gh   4                                            Pb   9                                            tvl    -2                                            Ap   -2                                               Bp   -7                                              D       Verbal consent was obtained to perform simple CMG procedure:   Prolapse was reduced using 2 large cotton swabs. Urethra was prepped with betadine and a 68F catheter was placed and bladder was drained completely. The bladder was then backfilled with sterile water by gravity.  First sensation: 230m First Desire: 2858mStrong Desire: 40067mapacity: 500m34mugh stress test was negative. Valsalva stress test was negative. No DO. Performed in both sitting and standing positions.  Catheter was replaced to drain the bladder.   Interpretation: CMG showed normal sensation, and normal cystometric capacity. Findings negative for stress incontinence, negative for detrusor overactivity.   Endometrial Biopsy: After informed consent was obtained and allergies confirmed, speculum was placed and cervix prepped with betadine. Pipelle was attempted to be passed but met resistance, so single tooth tenaculum placed on anterior lip of cervix. Pipelle could still not be fully passed so os finder used to dilate cervix. Pipelle then passed easily through os and 3 passes made with very scant tissue returned. Patient tolerated with some cramping. Tenaculum removed. No bleeding from tenaculum sites or from cervix. Tissue sent to pathology.  ASSESSMENT AND PLAN    Ms. CunnLubinskia 51 y6. with:  1. Menorrhagia with irregular cycle   2. Prolapse of anterior vaginal wall   3. Prolapse of posterior vaginal wall   4. Uterovaginal prolapse, incomplete    Plan for surgery: Exam under anesthesia, robotic assisted total laparoscopic hysterectomy, bilateral salpingectomy sacrocolpopexy, cystoscopy  - We reviewed the patient's specific anatomic and functional findings, with the assistance of diagrams, and together finalized the above procedure. The planned surgical procedures were discussed along with the surgical risks outlined below, which were  also provided on a detailed handout. Additional treatment options including expectant management, conservative management, medical management were discussed where appropriate.  We reviewed the benefits and risks of each treatment option.   - For preop Visit:  She is required to have a visit within 30 days of her surgery.    - Medical clearance: not required Letter sent to her PCP Dr MaxwZigmund Danieluesting risk stratification and medical optimization due to her hypertension - Anticoagulant use: No - Medicaid Hysterectomy form: No - Accepts blood transfusion: will confirm at pre op - Expected length of stay: outpatient  Request sent for surgery scheduling.   MichJaquita Folds

## 2021-11-29 LAB — SURGICAL PATHOLOGY

## 2021-11-30 NOTE — Telephone Encounter (Signed)
Called the pt to schedule an appt for risk stratification. The pt did not answer and I was unable to leave a vm.

## 2021-11-30 NOTE — Telephone Encounter (Signed)
-----   Message from [name], MD sent at 11/28/2021 10:04 AM EDT ----- We need to schedule this patient for risk stratification for surgery with me. Thanks!

## 2021-12-03 ENCOUNTER — Encounter: Payer: Self-pay | Admitting: Student

## 2021-12-04 ENCOUNTER — Telehealth: Payer: Self-pay | Admitting: Student

## 2021-12-04 NOTE — Telephone Encounter (Signed)
Multiple attempts to call for pre op visit, sent message as well. I will try to call again.   [name], MD

## 2021-12-24 ENCOUNTER — Other Ambulatory Visit (HOSPITAL_COMMUNITY): Payer: Managed Care, Other (non HMO)

## 2021-12-24 ENCOUNTER — Encounter: Payer: Self-pay | Admitting: Student

## 2021-12-24 ENCOUNTER — Ambulatory Visit (HOSPITAL_COMMUNITY)
Admission: RE | Admit: 2021-12-24 | Discharge: 2021-12-24 | Disposition: A | Discharge status: Home / Self Care | Payer: Managed Care, Other (non HMO) | Source: Ambulatory Visit | Attending: Family Medicine | Admitting: Family Medicine

## 2021-12-24 ENCOUNTER — Ambulatory Visit (INDEPENDENT_AMBULATORY_CARE_PROVIDER_SITE_OTHER): Payer: Self-pay | Admitting: Student

## 2021-12-24 VITALS — BP 132/100 | HR 73 | Ht 65.0 in | Wt 208.4 lb

## 2021-12-24 DIAGNOSIS — M25512 Pain in left shoulder: Secondary | ICD-10-CM

## 2021-12-24 DIAGNOSIS — I10 Essential (primary) hypertension: Secondary | ICD-10-CM

## 2021-12-24 DIAGNOSIS — R0609 Other forms of dyspnea: Secondary | ICD-10-CM

## 2021-12-24 DIAGNOSIS — Z01810 Encounter for preprocedural cardiovascular examination: Secondary | ICD-10-CM | POA: Insufficient documentation

## 2021-12-24 DIAGNOSIS — G8929 Other chronic pain: Secondary | ICD-10-CM

## 2021-12-24 DIAGNOSIS — R42 Dizziness and giddiness: Secondary | ICD-10-CM

## 2021-12-24 MED ORDER — IBUPROFEN 400 MG PO TABS: 400.0000 mg | ORAL_TABLET | Freq: Four times a day (QID) | ORAL | 0 refills | Status: DC | PRN

## 2021-12-24 MED ORDER — OXYCODONE HCL 5 MG PO TABS: 5.0000 mg | ORAL_TABLET | Freq: Four times a day (QID) | ORAL | 0 refills | Status: AC | PRN

## 2021-12-24 MED ORDER — AMLODIPINE BESYLATE 5 MG PO TABS: 5.0000 mg | ORAL_TABLET | Freq: Every day | ORAL | 3 refills | Status: AC
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
## 2021-12-24 NOTE — Patient Instructions (Addendum)
It was great to see you today! Thank you for choosing [facility] for your primary care. Katelyn Brown was seen for pre op optimization.    Please call -- [address]  [phone] for a possible injection +/- ultrasound I have provided a short term course of oxycodone 5 mg tablets to take for breakthrough pain.  You can also take ibuprofen 400 mg every 6 hours scheduled for the next couple of weeks.  Only take the oxycodone if you continue to experience pain with ibuprofen and Tylenol. We will contact you about your heart ultrasound as well as the MRI of your left shoulder. Continue with the x-ray of your cervical spine with [facility] imaging, you can walk-in to this location, you do not need to schedule an appointment for that. I am obtaining several labs and I will call you about the results for Continue with low impact activity and return to me in 3 weeks for further discussion.   You need a mammogram to prevent breast cancer.  Please schedule an appointment.  You can call [phone].   Schedule your colonoscopy to help detect colon cancer. The stomach doctors will call to schedule an appt with you. If you decide against this, please ask about the cologuard as an option.    You can get the Shingrix vaccine at the pharmacy. You will need a 2nd shot 2-6 months after the first. This vaccine is important to help prevent shingles.     If you haven't already, sign up for My Chart to have easy access to your labs results, and communication with your primary care physician.  I recommend that you always bring your medications to each appointment as this makes it easy to ensure you are on the correct medications and helps [place] not miss refills when you need them. Call the clinic at [phone] if your symptoms worsen or you have any concerns.  You should return to our clinic Return in about 3 weeks (around 01/14/2022) for chest pain, SOB, dizziness follow up . Please  arrive 15 minutes before your appointment to ensure smooth check in process.  We appreciate your efforts in making this happen.  Thank you for allowing me to participate in your care, [name], MD 12/24/2021, 10:43 AM PGY-2, [place]

## 2021-12-26 ENCOUNTER — Telehealth: Payer: Self-pay

## 2021-12-26 NOTE — Telephone Encounter (Signed)
Spoke with patient . Informed of MRI At [facility] on Wed. Nov 15th at 4:30pm. Patient understood and also have [place]. Salvatore Marvel, CMA

## 2022-01-02 ENCOUNTER — Ambulatory Visit (HOSPITAL_COMMUNITY)
Admission: RE | Admit: 2022-01-02 | Discharge: 2022-01-02 | Disposition: A | Discharge status: Home / Self Care | Payer: Managed Care, Other (non HMO) | Source: Ambulatory Visit | Attending: Family Medicine | Admitting: Family Medicine

## 2022-01-02 DIAGNOSIS — M25512 Pain in left shoulder: Secondary | ICD-10-CM | POA: Insufficient documentation

## 2022-01-02 DIAGNOSIS — G8929 Other chronic pain: Secondary | ICD-10-CM | POA: Insufficient documentation

## 2022-01-07 ENCOUNTER — Other Ambulatory Visit: Payer: Self-pay | Admitting: Student

## 2022-01-07 DIAGNOSIS — G8929 Other chronic pain: Secondary | ICD-10-CM

## 2022-01-07 DIAGNOSIS — M75122 Complete rotator cuff tear or rupture of left shoulder, not specified as traumatic: Secondary | ICD-10-CM

## 2022-01-09 ENCOUNTER — Ambulatory Visit: Payer: Self-pay | Admitting: Student
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
## 2022-01-09 NOTE — Patient Instructions (Incomplete)
It was great to see you today! Thank you for choosing [facility] for your primary care. Katelyn Brown was seen for follow up.  Today we addressed: ***  If you haven't already, sign up for My Chart to have easy access to your labs results, and communication with your primary care physician.  {AVS options:[id number]}  You should return to our clinic No follow-ups on file. Please arrive 15 minutes before your appointment to ensure smooth check in process.  We appreciate your efforts in making this happen.  Thank you for allowing me to participate in your care, [name], MD 01/09/2022, 7:16 AM PGY-***, [place]

## 2022-01-17 ENCOUNTER — Ambulatory Visit: Payer: Self-pay | Admitting: Sports Medicine

## 2022-01-24 ENCOUNTER — Ambulatory Visit (INDEPENDENT_AMBULATORY_CARE_PROVIDER_SITE_OTHER): Payer: Self-pay | Admitting: Sports Medicine

## 2022-01-24 VITALS — BP 134/98 | Ht 65.0 in | Wt 200.0 lb

## 2022-01-24 DIAGNOSIS — M75102 Unspecified rotator cuff tear or rupture of left shoulder, not specified as traumatic: Secondary | ICD-10-CM

## 2022-01-24 MED ORDER — TRAMADOL HCL 50 MG PO TABS: 50.0000 mg | ORAL_TABLET | Freq: Four times a day (QID) | ORAL | 0 refills | Status: AC | PRN

## 2022-01-24 NOTE — Progress Notes (Signed)
   Subjective:    Patient ID: Katelyn Brown, female    DOB: 03-19-1970, 51 y.o.   MRN: [id number]  HPI chief complaint: Left shoulder pain  Patient is a very pleasant 51 year old female that comes in today complaining of 2 months of worsening left shoulder pain.  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  She had had some intermittent pain prior to this, but it has recently gotten worse.  Pain is along the lateral aspect of the shoulder and arm.  She has great difficulty reaching overhead.  Pain at night as well.  She was prescribed some ibuprofen but it has been ineffective and her blood pressure has been elevated.  She denies any prior shoulder surgeries.  An MRI of her shoulder done on November 16 showed significant infraspinatus and supraspinatus tendinopathy with a small full-thickness tear at the anterior fibers of the supraspinatus tendon.  In addition to this she has bulky AC DJD and a type II to type III acromion.  Past medical history reviewed Medications reviewed Allergies reviewed    Review of Systems As above    Objective:   Physical Exam  Well-developed, well-nourished.  No acute distress  Left shoulder: Patient has limited active range of motion secondary to pain.  Positive painful arc.  Markedly positive empty can and positive Hawkins.  Pain and weakness with rotator cuff stressing.  Neurovascular intact distally.  MRI of the left shoulder as above      Assessment & Plan:   Left shoulder pain with MRI evidence of significant tendinopathy and a small rotator cuff tear  Combination of bulky AC DJD, type III acromial, advanced rotator cuff tendinopathy and tearing leading me to believe that this will ultimately need surgical intervention.  Therefore, I will refer her to Dr. Griffin Basil to discuss her treatment options.  I have provided her with a prescription for tramadol to take as needed for pain.  She is  instructed to take it primarily at night.  She will follow-up with me as needed.  This note was dictated using Dragon naturally speaking software and may contain errors in syntax, spelling, or content which have not been identified prior to signing this note.

## 2022-01-24 NOTE — Patient Instructions (Addendum)
We are going to refer you to Dr. [facility] [address] [phone]  Appt: 01/29/22 @ 8:45 am, please arrive at 8:30 am.

## 2022-01-25 ENCOUNTER — Ambulatory Visit (HOSPITAL_COMMUNITY): Admission: RE | Admit: 2022-01-25 | Payer: Medicaid Other | Source: Ambulatory Visit

## 2022-01-28 ENCOUNTER — Other Ambulatory Visit (HOSPITAL_COMMUNITY): Payer: Medicaid Other

## 2022-01-29 ENCOUNTER — Ambulatory Visit (HOSPITAL_COMMUNITY)
Admission: RE | Admit: 2022-01-29 | Discharge: 2022-01-29 | Disposition: A | Discharge status: Home / Self Care | Payer: BC Managed Care – PPO | Source: Ambulatory Visit | Attending: Family Medicine | Admitting: Family Medicine

## 2022-01-29 DIAGNOSIS — R0600 Dyspnea, unspecified: Secondary | ICD-10-CM | POA: Insufficient documentation

## 2022-01-29 DIAGNOSIS — R42 Dizziness and giddiness: Secondary | ICD-10-CM | POA: Diagnosis not present

## 2022-01-29 DIAGNOSIS — R0609 Other forms of dyspnea: Secondary | ICD-10-CM | POA: Diagnosis not present

## 2022-01-29 LAB — ECHOCARDIOGRAM COMPLETE
AR max vel: 2.76 cm2
AV Area VTI: 2.86 cm2
AV Area mean vel: 2.78 cm2
AV Mean grad: 3 mmHg
AV Peak grad: 5.3 mmHg
Ao pk vel: 1.15 m/s
Area-P 1/2: 4.49 cm2
Calc EF: 56.6 %
MV M vel: 3.08 m/s
MV Peak grad: 37.9 mmHg
S' Lateral: 2.9 cm
Single Plane A2C EF: 57.2 %
Single Plane A4C EF: 57.1 %

## 2022-01-29 NOTE — Progress Notes (Signed)
  Echocardiogram 2D Echocardiogram has been performed.  Lana Fish 01/29/2022, 9:08 AM

## 2022-01-30 ENCOUNTER — Encounter (HOSPITAL_BASED_OUTPATIENT_CLINIC_OR_DEPARTMENT_OTHER): Payer: Self-pay | Admitting: *Deleted

## 2022-02-06 ENCOUNTER — Encounter: Payer: Self-pay | Admitting: Student

## 2022-02-13 NOTE — Progress Notes (Signed)
Katelyn Brown is a 51 y.o. female  came in and dropped off FMLA paper from [employer] notified there will be a 10-14 day turn around for forms to be ready.  Pt notified  she will be contacted as soon as the from are ready and have been faxed.  Pre-op date: 02/25/2022 Surgery date: 03/06/2022 Post op date:04/17/2022

## 2022-02-25 ENCOUNTER — Ambulatory Visit: Payer: BC Managed Care – PPO | Admitting: Obstetrics and Gynecology

## 2022-02-25 ENCOUNTER — Encounter: Payer: Self-pay | Admitting: Obstetrics and Gynecology

## 2022-02-25 VITALS — BP 137/89 | HR 80 | Wt 209.8 lb

## 2022-02-25 DIAGNOSIS — N921 Excessive and frequent menstruation with irregular cycle: Secondary | ICD-10-CM

## 2022-02-25 DIAGNOSIS — N816 Rectocele: Secondary | ICD-10-CM | POA: Diagnosis not present

## 2022-02-25 DIAGNOSIS — Z01818 Encounter for other preprocedural examination: Secondary | ICD-10-CM

## 2022-02-25 DIAGNOSIS — N812 Incomplete uterovaginal prolapse: Secondary | ICD-10-CM

## 2022-02-25 DIAGNOSIS — N8110 Cystocele, unspecified: Secondary | ICD-10-CM

## 2022-02-25 MED ORDER — POLYETHYLENE GLYCOL 3350 17 GM/SCOOP PO POWD: 17.0000 g | Freq: Every day | ORAL | 0 refills | Status: AC

## 2022-02-25 MED ORDER — IBUPROFEN 600 MG PO TABS: 600.0000 mg | ORAL_TABLET | Freq: Four times a day (QID) | ORAL | 0 refills | Status: DC | PRN

## 2022-02-25 MED ORDER — ACETAMINOPHEN 500 MG PO TABS: 500.0000 mg | ORAL_TABLET | Freq: Four times a day (QID) | ORAL | 0 refills | Status: AC | PRN

## 2022-02-25 MED ORDER — OXYCODONE HCL 5 MG PO TABS: 5.0000 mg | ORAL_TABLET | ORAL | 0 refills | Status: DC | PRN
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
## 2022-02-27 ENCOUNTER — Encounter (HOSPITAL_BASED_OUTPATIENT_CLINIC_OR_DEPARTMENT_OTHER): Payer: Self-pay | Admitting: Obstetrics and Gynecology

## 2022-02-27 ENCOUNTER — Encounter: Payer: Self-pay | Admitting: Obstetrics and Gynecology

## 2022-02-27 NOTE — Progress Notes (Addendum)
I called and left a message for Katelyn Brown, Dr. Tommas Olp scheduler at [phone]. I let her know that the patient visited her PCP on 12/24/21 for surgical clearance. Lab work was ordered and she was to follow-up with PCP at the end of November. I cannot see where the patient ever had the lab work done or followed up with her PCP for final clearance.    Addendum: I also sent Dr. Wannetta Sender an Epic IB note making her aware of the above info.  I called patient and made a pre-op lab appointment for 03/04/22 at 12 noon. I let the patient know that she needed to contact her PCP to follow-up on lab work and final clearance.

## 2022-02-28 ENCOUNTER — Encounter: Payer: Self-pay | Admitting: Student

## 2022-02-28 ENCOUNTER — Telehealth: Payer: Self-pay

## 2022-02-28 ENCOUNTER — Other Ambulatory Visit: Payer: Self-pay | Admitting: Student

## 2022-02-28 NOTE — Progress Notes (Signed)
    Katelyn Brown [id number] January 07, 1971  Katelyn Brown is currently under my care and has been seen in my office for a preoperative exam, see note from 12/24/21 for full assessment. This patient is medically optimized for surgery and requires no further treatment or workup prior to proceeding with surgery. The risk involved with a surgical procedure for this is intermediate. She is scheduled for robotic assisted total laparoscopic hysterectomy, bilateral salpingectomy sacrocolpopexy, cystoscopy on 03/06/22. Will follow along and have patient return to my office after surgical intervention.    [name], MD  [place], [place] 02/28/2022

## 2022-02-28 NOTE — Telephone Encounter (Signed)
Patient calls nurse line regarding surgical clearance.   She is asking that provider send her clearance letter through [employer].   Will forward request to PCP.   Talbot Grumbling, RN

## 2022-02-28 NOTE — Telephone Encounter (Signed)
Patient does not have access to this area of her chart. Can she receive this as a letter? If you are okay with this, I can copy this to a letter and send to her mychart.   Talbot Grumbling, RN

## 2022-02-28 NOTE — Telephone Encounter (Signed)
Letter sent to patient via mychart.   Talbot Grumbling, RN

## 2022-03-04 ENCOUNTER — Other Ambulatory Visit: Payer: Self-pay

## 2022-03-04 ENCOUNTER — Encounter (HOSPITAL_BASED_OUTPATIENT_CLINIC_OR_DEPARTMENT_OTHER): Payer: Self-pay | Admitting: Obstetrics and Gynecology

## 2022-03-04 ENCOUNTER — Encounter (HOSPITAL_COMMUNITY)
Admission: RE | Admit: 2022-03-04 | Discharge: 2022-03-04 | Disposition: A | Discharge status: Home / Self Care | Payer: BC Managed Care – PPO | Source: Ambulatory Visit | Attending: Obstetrics and Gynecology | Admitting: Obstetrics and Gynecology

## 2022-03-04 DIAGNOSIS — Z01812 Encounter for preprocedural laboratory examination: Secondary | ICD-10-CM | POA: Insufficient documentation

## 2022-03-04 DIAGNOSIS — N812 Incomplete uterovaginal prolapse: Secondary | ICD-10-CM | POA: Diagnosis present

## 2022-03-04 DIAGNOSIS — G709 Myoneural disorder, unspecified: Secondary | ICD-10-CM | POA: Diagnosis not present

## 2022-03-04 DIAGNOSIS — Z6834 Body mass index (BMI) 34.0-34.9, adult: Secondary | ICD-10-CM | POA: Diagnosis not present

## 2022-03-04 DIAGNOSIS — Z79899 Other long term (current) drug therapy: Secondary | ICD-10-CM | POA: Diagnosis not present

## 2022-03-04 DIAGNOSIS — N736 Female pelvic peritoneal adhesions (postinfective): Secondary | ICD-10-CM | POA: Diagnosis not present

## 2022-03-04 DIAGNOSIS — Z01818 Encounter for other preprocedural examination: Secondary | ICD-10-CM

## 2022-03-04 DIAGNOSIS — N840 Polyp of corpus uteri: Secondary | ICD-10-CM | POA: Diagnosis not present

## 2022-03-04 DIAGNOSIS — E669 Obesity, unspecified: Secondary | ICD-10-CM | POA: Diagnosis not present

## 2022-03-04 DIAGNOSIS — I10 Essential (primary) hypertension: Secondary | ICD-10-CM | POA: Diagnosis not present

## 2022-03-04 DIAGNOSIS — F419 Anxiety disorder, unspecified: Secondary | ICD-10-CM | POA: Diagnosis not present

## 2022-03-04 DIAGNOSIS — D250 Submucous leiomyoma of uterus: Secondary | ICD-10-CM | POA: Diagnosis not present

## 2022-03-04 DIAGNOSIS — D251 Intramural leiomyoma of uterus: Secondary | ICD-10-CM | POA: Diagnosis not present

## 2022-03-04 DIAGNOSIS — K589 Irritable bowel syndrome without diarrhea: Secondary | ICD-10-CM | POA: Diagnosis not present

## 2022-03-04 DIAGNOSIS — N8003 Adenomyosis of the uterus: Secondary | ICD-10-CM | POA: Diagnosis not present

## 2022-03-04 DIAGNOSIS — D259 Leiomyoma of uterus, unspecified: Secondary | ICD-10-CM | POA: Diagnosis not present

## 2022-03-04 LAB — BASIC METABOLIC PANEL
Anion gap: 11 (ref 5–15)
BUN: 12 mg/dL (ref 6–20)
CO2: 23 mmol/L (ref 22–32)
Calcium: 8.4 mg/dL — ABNORMAL LOW (ref 8.9–10.3)
Chloride: 105 mmol/L (ref 98–111)
Creatinine, Ser: 0.69 mg/dL (ref 0.44–1.00)
GFR, Estimated: >60 mL/min (ref >60)
Glucose, Bld: 99 mg/dL (ref 70–99)
Potassium: 3.6 mmol/L (ref 3.5–5.1)
Sodium: 139 mmol/L (ref 135–145)

## 2022-03-04 LAB — CBC
HCT: 37.7 % (ref 36.0–46.0)
Hemoglobin: 12.1 g/dL (ref 12.0–15.0)
MCH: 28.5 pg (ref 26.0–34.0)
MCHC: 32.1 g/dL (ref 30.0–36.0)
MCV: 88.7 fL (ref 80.0–100.0)
Platelets: 263 10*3/uL (ref 150–400)
RBC: 4.25 MIL/uL (ref 3.87–5.11)
RDW: 14.1 % (ref 11.5–15.5)
WBC: 7 10*3/uL (ref 4.0–10.5)
nRBC: 0 % (ref 0.0–0.2)

## 2022-03-04 NOTE — Progress Notes (Signed)
Spoke w/ via phone for pre-op interview---Katelyn [name] needs dos----urine pregnancy            [name] results------03/04/22 [name] appt for cbc, bmp, type & screen, 01/29/22 Echo in Epic, 12/24/21 EKG in Epic & chart COVID test -----patient states asymptomatic no test needed Arrive at -------0630 on Wednesday, 03/06/22 NPO after MN NO Solid Food.  Clear liquids from MN until---0530 Med rec completed Medications to take morning of surgery -----Amlodipine, Flonase Diabetic medication -----n/a Patient instructed no nail polish to be worn day of surgery Patient instructed to bring photo id and insurance card day of surgery Patient aware to have Driver (ride ) / caregiver    for 24 hours after surgery - Driver - Angie Fava, Caregivers - daughters Patient Special Instructions -----Extended / overnight stay instructions given. Pre-Op special Istructions -----none Patient verbalized understanding of instructions that were given at this phone interview. Patient denies shortness of breath, chest pain, fever, cough at this phone interview.  Pre-operative clearance given by Dr. [name] on 02/28/2022 (in chart & Epic.)

## 2022-03-04 NOTE — Progress Notes (Signed)
Your procedure is scheduled on Wednesday, 03/06/2022.  Report to [facility]   Call this number if you have problems the morning of surgery  :[phone].   OUR ADDRESS IS [address].  WE ARE LOCATED IN [facility].  PLEASE BRING YOUR INSURANCE CARD AND PHOTO ID DAY OF SURGERY.  ONLY 2 PEOPLE ARE ALLOWED IN  WAITING  ROOM.                                      REMEMBER:  DO NOT EAT FOOD, CANDY GUM OR MINTS  AFTER MIDNIGHT THE NIGHT BEFORE YOUR SURGERY . YOU MAY HAVE CLEAR LIQUIDS FROM MIDNIGHT THE NIGHT BEFORE YOUR SURGERY UNTIL  5:30 AM. NO CLEAR LIQUIDS AFTER   5:30 AM DAY OF SURGERY.  YOU MAY  BRUSH YOUR TEETH MORNING OF SURGERY AND RINSE YOUR MOUTH OUT, NO CHEWING GUM CANDY OR MINTS.     CLEAR LIQUID DIET   Foods Allowed                                                                     Foods Excluded  Coffee and tea, regular and decaf                             liquids that you cannot  Plain Jell-O                                                                   see through such as: Fruit ices (not with fruit pulp)                                     milk, soups, orange juice  Plain  Popsicles                                    All solid food Carbonated beverages, regular and diet                                    Cranberry, grape and apple juices Sports drinks like Gatorade _____________________________________________________________________     TAKE ONLY THESE MEDICATIONS MORNING OF SURGERY: Amlodipine, Flonase    UP TO 4 VISITORS  MAY VISIT IN THE EXTENDED RECOVERY ROOM UNTIL 800 PM ONLY.  ONE  VISITOR AGE 49 AND OVER MAY SPEND THE NIGHT AND MUST BE IN EXTENDED RECOVERY ROOM NO LATER THAN 800 PM . YOUR DISCHARGE TIME AFTER YOU SPEND THE NIGHT IS 900 AM THE MORNING AFTER YOUR SURGERY.  YOU MAY PACK A SMALL OVERNIGHT BAG WITH TOILETRIES FOR YOUR OVERNIGHT  STAY IF YOU WISH.  YOUR PRESCRIPTION MEDICATIONS  WILL BE PROVIDED DURING [place].                                      DO NOT WEAR JEWERLY, MAKE UP. DO NOT WEAR LOTIONS, POWDERS, PERFUMES OR NAIL POLISH ON YOUR FINGERNAILS. TOENAIL POLISH IS OK TO WEAR. DO NOT SHAVE FOR 48 HOURS PRIOR TO DAY OF SURGERY. MEN MAY SHAVE FACE AND NECK. CONTACTS, GLASSES, OR DENTURES MAY NOT BE WORN TO SURGERY.  REMEMBER: NO SMOKING, DRUGS OR ALCOHOL FOR 24 HOURS BEFORE YOUR SURGERY.                                    [facility] IS NOT RESPONSIBLE  FOR ANY BELONGINGS.                                                                    Marland Kitchen           [facility] - Preparing for Surgery Before surgery, you can play an important role.  Because skin is not sterile, your skin needs to be as free of germs as possible.  You can reduce the number of germs on your skin by washing with CHG (chlorahexidine gluconate) soap before surgery.  CHG is an antiseptic cleaner which kills germs and bonds with the skin to continue killing germs even after washing. Please DO NOT use if you have an allergy to CHG or antibacterial soaps.  If your skin becomes reddened/irritated stop using the CHG and inform your nurse when you arrive at Short Stay. Do not shave (including legs and underarms) for at least 48 hours prior to the first CHG shower.  You may shave your face/neck. Please follow these instructions carefully:  1.  Shower with CHG Soap the night before surgery and the  morning of Surgery.  2.  If you choose to wash your hair, wash your hair first as usual with your  normal  shampoo.  3.  After you shampoo, rinse your hair and body thoroughly to remove the  shampoo.                                        4.  Use CHG as you would any other liquid soap.  You can apply chg directly  to the skin and wash , chg soap provided, night before and morning of your surgery.  5.  Apply the CHG Soap to your body ONLY FROM THE NECK DOWN.   Do not use on face/ open                            Wound or open sores. Avoid contact with eyes, ears mouth and genitals (private parts).                       Wash face,  Genitals (private parts) with your normal soap.  6.  Wash thoroughly, paying special attention to the area where your surgery  will be performed.  7.  Thoroughly rinse your body with warm water from the neck down.  8.  DO NOT shower/wash with your normal soap after using and rinsing off  the CHG Soap.             9.  Pat yourself dry with a clean towel.            10.  Wear clean pajamas.            11.  Place clean sheets on your bed the night of your first shower and do not  sleep with pets. Day of Surgery : Do not apply any lotions/deodorants the morning of surgery.  Please wear clean clothes to the hospital/surgery center.  IF YOU HAVE ANY SKIN IRRITATION OR PROBLEMS WITH THE SURGICAL SOAP, PLEASE GET A BAR OF GOLD DIAL SOAP AND SHOWER THE NIGHT BEFORE YOUR SURGERY AND THE MORNING OF YOUR SURGERY. PLEASE LET THE NURSE KNOW MORNING OF YOUR SURGERY IF YOU HAD ANY PROBLEMS WITH THE SURGICAL SOAP.   ________________________________________________________________________                                                        QUESTIONS [facility] PRE OP NURSE PHONE [phone].

## 2022-03-06 ENCOUNTER — Ambulatory Visit (HOSPITAL_BASED_OUTPATIENT_CLINIC_OR_DEPARTMENT_OTHER)
Admission: RE | Admit: 2022-03-06 | Discharge: 2022-03-06 | Disposition: A | Discharge status: Home / Self Care | Payer: BC Managed Care – PPO | Attending: Obstetrics and Gynecology | Admitting: Obstetrics and Gynecology

## 2022-03-06 ENCOUNTER — Ambulatory Visit (HOSPITAL_BASED_OUTPATIENT_CLINIC_OR_DEPARTMENT_OTHER): Payer: BC Managed Care – PPO | Admitting: Anesthesiology

## 2022-03-06 ENCOUNTER — Other Ambulatory Visit: Payer: Self-pay

## 2022-03-06 ENCOUNTER — Encounter (HOSPITAL_BASED_OUTPATIENT_CLINIC_OR_DEPARTMENT_OTHER): Payer: Self-pay | Admitting: Obstetrics and Gynecology

## 2022-03-06 ENCOUNTER — Encounter (HOSPITAL_BASED_OUTPATIENT_CLINIC_OR_DEPARTMENT_OTHER)
Admission: RE | Disposition: A | Discharge status: Home / Self Care | Payer: Self-pay | Source: Home / Self Care | Attending: Obstetrics and Gynecology

## 2022-03-06 DIAGNOSIS — D250 Submucous leiomyoma of uterus: Secondary | ICD-10-CM | POA: Insufficient documentation

## 2022-03-06 DIAGNOSIS — N816 Rectocele: Secondary | ICD-10-CM

## 2022-03-06 DIAGNOSIS — F419 Anxiety disorder, unspecified: Secondary | ICD-10-CM | POA: Insufficient documentation

## 2022-03-06 DIAGNOSIS — N8110 Cystocele, unspecified: Secondary | ICD-10-CM | POA: Diagnosis not present

## 2022-03-06 DIAGNOSIS — K589 Irritable bowel syndrome without diarrhea: Secondary | ICD-10-CM | POA: Insufficient documentation

## 2022-03-06 DIAGNOSIS — N736 Female pelvic peritoneal adhesions (postinfective): Secondary | ICD-10-CM | POA: Insufficient documentation

## 2022-03-06 DIAGNOSIS — I10 Essential (primary) hypertension: Secondary | ICD-10-CM | POA: Insufficient documentation

## 2022-03-06 DIAGNOSIS — N812 Incomplete uterovaginal prolapse: Secondary | ICD-10-CM | POA: Diagnosis present

## 2022-03-06 DIAGNOSIS — D259 Leiomyoma of uterus, unspecified: Secondary | ICD-10-CM

## 2022-03-06 DIAGNOSIS — N840 Polyp of corpus uteri: Secondary | ICD-10-CM | POA: Insufficient documentation

## 2022-03-06 DIAGNOSIS — D251 Intramural leiomyoma of uterus: Secondary | ICD-10-CM | POA: Insufficient documentation

## 2022-03-06 DIAGNOSIS — Z79899 Other long term (current) drug therapy: Secondary | ICD-10-CM | POA: Insufficient documentation

## 2022-03-06 DIAGNOSIS — G709 Myoneural disorder, unspecified: Secondary | ICD-10-CM | POA: Insufficient documentation

## 2022-03-06 DIAGNOSIS — E669 Obesity, unspecified: Secondary | ICD-10-CM | POA: Insufficient documentation

## 2022-03-06 DIAGNOSIS — Z01818 Encounter for other preprocedural examination: Secondary | ICD-10-CM

## 2022-03-06 DIAGNOSIS — Z6834 Body mass index (BMI) 34.0-34.9, adult: Secondary | ICD-10-CM | POA: Insufficient documentation

## 2022-03-06 DIAGNOSIS — N8003 Adenomyosis of the uterus: Secondary | ICD-10-CM | POA: Insufficient documentation

## 2022-03-06 HISTORY — PX: CYSTOSCOPY: SHX5120

## 2022-03-06 HISTORY — PX: XI ROBOTIC ASSISTED TOTAL HYSTERECTOMY WITH SACROCOLPOPEXY: SHX6825

## 2022-03-06 HISTORY — DX: Other chronic pain: G89.29

## 2022-03-06 LAB — TYPE AND SCREEN
ABO/RH(D): B POS
Antibody Screen: NEGATIVE

## 2022-03-06 LAB — POCT PREGNANCY, URINE: Preg Test, Ur: NEGATIVE

## 2022-03-06 LAB — ABO/RH: ABO/RH(D): B POS

## 2022-03-06 SURGERY — HYSTERECTOMY, TOTAL, ROBOT-ASSISTED, WITH SACROCOLPOPEXY
Anesthesia: General | Site: Pelvis

## 2022-03-06 MED ORDER — SCOPOLAMINE 1 MG/3DAYS TD PT72
MEDICATED_PATCH | TRANSDERMAL | Status: AC
  Filled 2022-03-06: qty 1

## 2022-03-06 MED ORDER — ONDANSETRON HCL 4 MG/2ML IJ SOLN
INTRAMUSCULAR | Status: AC
  Filled 2022-03-06: qty 2

## 2022-03-06 MED ORDER — GABAPENTIN 300 MG PO CAPS
ORAL_CAPSULE | ORAL | Status: AC
  Filled 2022-03-06: qty 1

## 2022-03-06 MED ORDER — ONDANSETRON HCL 4 MG PO TABS: 4.0000 mg | ORAL_TABLET | Freq: Four times a day (QID) | ORAL | Status: DC | PRN

## 2022-03-06 MED ORDER — POVIDONE-IODINE 10 % EX SWAB: 2.0000 | Freq: Once | CUTANEOUS | Status: DC

## 2022-03-06 MED ORDER — FENTANYL CITRATE (PF) 100 MCG/2ML IJ SOLN
INTRAMUSCULAR | Status: AC
  Filled 2022-03-06: qty 2

## 2022-03-06 MED ORDER — LIDOCAINE HCL (PF) 2 % IJ SOLN
INTRAMUSCULAR | Status: AC
  Filled 2022-03-06: qty 5

## 2022-03-06 MED ORDER — SUGAMMADEX SODIUM 200 MG/2ML IV SOLN
INTRAVENOUS | Status: DC | PRN
  Administered 2022-03-06: 200 mg via INTRAVENOUS

## 2022-03-06 MED ORDER — ONDANSETRON HCL 4 MG/2ML IJ SOLN: 4.0000 mg | Freq: Four times a day (QID) | INTRAMUSCULAR | Status: DC | PRN

## 2022-03-06 MED ORDER — ROCURONIUM BROMIDE 10 MG/ML (PF) SYRINGE
PREFILLED_SYRINGE | INTRAVENOUS | Status: DC | PRN
  Administered 2022-03-06: 10 mg via INTRAVENOUS
  Administered 2022-03-06: 70 mg via INTRAVENOUS
  Administered 2022-03-06: 10 mg via INTRAVENOUS

## 2022-03-06 MED ORDER — STERILE WATER FOR IRRIGATION IR SOLN
Status: DC | PRN
  Administered 2022-03-06: 500 mL

## 2022-03-06 MED ORDER — BUPIVACAINE HCL (PF) 0.25 % IJ SOLN
INTRAMUSCULAR | Status: DC | PRN
  Administered 2022-03-06: 30 mL

## 2022-03-06 MED ORDER — PHENAZOPYRIDINE HCL 100 MG PO TABS
ORAL_TABLET | ORAL | Status: AC
  Filled 2022-03-06: qty 2

## 2022-03-06 MED ORDER — ACETAMINOPHEN 500 MG PO TABS
ORAL_TABLET | ORAL | Status: AC
  Filled 2022-03-06: qty 2

## 2022-03-06 MED ORDER — DEXAMETHASONE SODIUM PHOSPHATE 10 MG/ML IJ SOLN
INTRAMUSCULAR | Status: AC
  Filled 2022-03-06: qty 1

## 2022-03-06 MED ORDER — SODIUM CHLORIDE 0.9 % IR SOLN
Status: DC | PRN
  Administered 2022-03-06: 300 mL via INTRAVESICAL
  Administered 2022-03-06: 200 mL

## 2022-03-06 MED ORDER — FENTANYL CITRATE (PF) 100 MCG/2ML IJ SOLN: 25.0000 ug | INTRAMUSCULAR | Status: DC | PRN

## 2022-03-06 MED ORDER — ROCURONIUM BROMIDE 10 MG/ML (PF) SYRINGE
PREFILLED_SYRINGE | INTRAVENOUS | Status: AC
  Filled 2022-03-06: qty 10

## 2022-03-06 MED ORDER — IBUPROFEN 200 MG PO TABS
ORAL_TABLET | ORAL | Status: AC
  Filled 2022-03-06: qty 3

## 2022-03-06 MED ORDER — ACETAMINOPHEN 500 MG PO TABS: 1000.0000 mg | ORAL_TABLET | Freq: Once | ORAL | Status: AC

## 2022-03-06 MED ORDER — DEXAMETHASONE SODIUM PHOSPHATE 10 MG/ML IJ SOLN
INTRAMUSCULAR | Status: DC | PRN
  Administered 2022-03-06: 5 mg via INTRAVENOUS

## 2022-03-06 MED ORDER — EPHEDRINE 5 MG/ML INJ
INTRAVENOUS | Status: AC
  Filled 2022-03-06: qty 5

## 2022-03-06 MED ORDER — PROPOFOL 10 MG/ML IV BOLUS
INTRAVENOUS | Status: AC
  Filled 2022-03-06: qty 20

## 2022-03-06 MED ORDER — OXYCODONE HCL 5 MG PO TABS
ORAL_TABLET | ORAL | Status: AC
  Filled 2022-03-06: qty 1

## 2022-03-06 MED ORDER — DEXMEDETOMIDINE HCL IN NACL 80 MCG/20ML IV SOLN: INTRAVENOUS | Status: DC | PRN

## 2022-03-06 MED ORDER — EPHEDRINE SULFATE-NACL 50-0.9 MG/10ML-% IV SOSY
PREFILLED_SYRINGE | INTRAVENOUS | Status: DC | PRN
  Administered 2022-03-06: 10 mg via INTRAVENOUS

## 2022-03-06 MED ORDER — LACTATED RINGERS IV SOLN: INTRAVENOUS | Status: DC

## 2022-03-06 MED ORDER — ONDANSETRON HCL 4 MG/2ML IJ SOLN
INTRAMUSCULAR | Status: DC | PRN
  Administered 2022-03-06: 4 mg via INTRAVENOUS

## 2022-03-06 MED ORDER — SIMETHICONE 80 MG PO CHEW
80.0000 mg | CHEWABLE_TABLET | Freq: Four times a day (QID) | ORAL | Status: DC | PRN
  Administered 2022-03-06: 80 mg via ORAL

## 2022-03-06 MED ORDER — DEXMEDETOMIDINE HCL IN NACL 80 MCG/20ML IV SOLN
INTRAVENOUS | Status: AC
  Filled 2022-03-06: qty 20

## 2022-03-06 MED ORDER — DEXMEDETOMIDINE HCL IN NACL 200 MCG/50ML IV SOLN
INTRAVENOUS | Status: DC | PRN
  Administered 2022-03-06 (×3): 8 ug via INTRAVENOUS
  Administered 2022-03-06: 4 ug via INTRAVENOUS

## 2022-03-06 MED ORDER — SIMETHICONE 80 MG PO CHEW
CHEWABLE_TABLET | ORAL | Status: AC
  Filled 2022-03-06: qty 1

## 2022-03-06 MED ORDER — CEFAZOLIN SODIUM-DEXTROSE 2-4 GM/100ML-% IV SOLN
INTRAVENOUS | Status: AC
  Filled 2022-03-06: qty 100

## 2022-03-06 MED ORDER — SCOPOLAMINE 1 MG/3DAYS TD PT72: 1.0000 | MEDICATED_PATCH | Freq: Once | TRANSDERMAL | Status: DC

## 2022-03-06 MED ORDER — PROMETHAZINE HCL 25 MG/ML IJ SOLN: 6.2500 mg | INTRAMUSCULAR | Status: DC | PRN

## 2022-03-06 MED ORDER — MIDAZOLAM HCL 2 MG/2ML IJ SOLN
INTRAMUSCULAR | Status: AC
  Filled 2022-03-06: qty 2

## 2022-03-06 MED ORDER — SCOPOLAMINE 1 MG/3DAYS TD PT72
MEDICATED_PATCH | TRANSDERMAL | Status: DC | PRN
  Administered 2022-03-06: 1 via TRANSDERMAL

## 2022-03-06 MED ORDER — PHENAZOPYRIDINE HCL 100 MG PO TABS
200.0000 mg | ORAL_TABLET | ORAL | Status: AC
  Administered 2022-03-06: 200 mg via ORAL

## 2022-03-06 MED ORDER — ACETAMINOPHEN 325 MG PO TABS
650.0000 mg | ORAL_TABLET | ORAL | Status: DC | PRN
  Administered 2022-03-06: 650 mg via ORAL

## 2022-03-06 MED ORDER — CEFAZOLIN SODIUM-DEXTROSE 2-4 GM/100ML-% IV SOLN
2.0000 g | INTRAVENOUS | Status: AC
  Administered 2022-03-06: 2 g via INTRAVENOUS

## 2022-03-06 MED ORDER — ACETAMINOPHEN 500 MG PO TABS
1000.0000 mg | ORAL_TABLET | ORAL | Status: AC
  Administered 2022-03-06: 1000 mg via ORAL

## 2022-03-06 MED ORDER — ACETAMINOPHEN 325 MG PO TABS
ORAL_TABLET | ORAL | Status: AC
  Filled 2022-03-06: qty 2

## 2022-03-06 MED ORDER — GABAPENTIN 300 MG PO CAPS
300.0000 mg | ORAL_CAPSULE | ORAL | Status: AC
  Administered 2022-03-06: 300 mg via ORAL

## 2022-03-06 MED ORDER — MIDAZOLAM HCL 2 MG/2ML IJ SOLN
INTRAMUSCULAR | Status: DC | PRN
  Administered 2022-03-06: 2 mg via INTRAVENOUS

## 2022-03-06 MED ORDER — LIDOCAINE 2% (20 MG/ML) 5 ML SYRINGE
INTRAMUSCULAR | Status: DC | PRN
  Administered 2022-03-06: 1.5 mg/kg/h via INTRAVENOUS
  Administered 2022-03-06: 80 mg via INTRAVENOUS

## 2022-03-06 MED ORDER — OXYCODONE HCL 5 MG PO TABS
5.0000 mg | ORAL_TABLET | ORAL | Status: DC | PRN
  Administered 2022-03-06 (×2): 5 mg via ORAL

## 2022-03-06 MED ORDER — IBUPROFEN 200 MG PO TABS
600.0000 mg | ORAL_TABLET | Freq: Four times a day (QID) | ORAL | Status: DC
  Administered 2022-03-06 (×2): 600 mg via ORAL

## 2022-03-06 MED ORDER — KETOROLAC TROMETHAMINE 30 MG/ML IJ SOLN
INTRAMUSCULAR | Status: AC
  Filled 2022-03-06: qty 1

## 2022-03-06 MED ORDER — FENTANYL CITRATE (PF) 100 MCG/2ML IJ SOLN
INTRAMUSCULAR | Status: DC | PRN
  Administered 2022-03-06: 25 ug via INTRAVENOUS
  Administered 2022-03-06 (×2): 50 ug via INTRAVENOUS

## 2022-03-06 MED ORDER — PROPOFOL 10 MG/ML IV BOLUS
INTRAVENOUS | Status: DC | PRN
  Administered 2022-03-06: 170 mg via INTRAVENOUS

## 2022-03-06 SURGICAL SUPPLY — 71 items
ADH SKN CLS APL DERMABOND .7 (GAUZE/BANDAGES/DRESSINGS) ×2
APL PRP STRL LF DISP 70% ISPRP (MISCELLANEOUS) ×2
CATH FOLEY 3WAY  5CC 16FR (CATHETERS) ×2
CATH FOLEY 3WAY 5CC 16FR (CATHETERS) ×2 IMPLANT
CHLORAPREP W/TINT 26 (MISCELLANEOUS) ×2 IMPLANT
COVER BACK TABLE 60X90IN (DRAPES) ×2 IMPLANT
COVER TIP SHEARS 8 DVNC (MISCELLANEOUS) ×2 IMPLANT
COVER TIP SHEARS 8MM DA VINCI (MISCELLANEOUS) ×2
DEFOGGER SCOPE WARMER CLEARIFY (MISCELLANEOUS) ×2 IMPLANT
DERMABOND ADVANCED .7 DNX12 (GAUZE/BANDAGES/DRESSINGS) ×2 IMPLANT
DRAPE ARM DVNC X/XI (DISPOSABLE) ×8 IMPLANT
DRAPE COLUMN DVNC XI (DISPOSABLE) ×2 IMPLANT
DRAPE DA VINCI XI ARM (DISPOSABLE) ×8
DRAPE DA VINCI XI COLUMN (DISPOSABLE) ×2
DRAPE SHEET LG 3/4 BI-LAMINATE (DRAPES) ×2 IMPLANT
DRAPE UTILITY XL STRL (DRAPES) ×2 IMPLANT
ELECT REM PT RETURN 9FT ADLT (ELECTROSURGICAL) ×2
ELECTRODE REM PT RTRN 9FT ADLT (ELECTROSURGICAL) ×2 IMPLANT
GAUZE 4X4 16PLY ~~LOC~~+RFID DBL (SPONGE) ×4 IMPLANT
GLOVE BIOGEL PI IND STRL 6.5 (GLOVE) ×8 IMPLANT
GLOVE BIOGEL PI IND STRL 7.0 (GLOVE) ×4 IMPLANT
GLOVE ECLIPSE 6.0 STRL STRAW (GLOVE) ×6 IMPLANT
GOWN STRL REUS W/TWL LRG LVL3 (GOWN DISPOSABLE) ×2 IMPLANT
HIBICLENS CHG 4% 4OZ BTL (MISCELLANEOUS) ×2 IMPLANT
HOLDER FOLEY CATH W/STRAP (MISCELLANEOUS) ×2 IMPLANT
IRRIG SUCT STRYKERFLOW 2 WTIP (MISCELLANEOUS) ×2
IRRIGATION SUCT STRKRFLW 2 WTP (MISCELLANEOUS) ×2 IMPLANT
KIT TURNOVER CYSTO (KITS) ×2 IMPLANT
LEGGING LITHOTOMY PAIR STRL (DRAPES) ×2 IMPLANT
MANIFOLD NEPTUNE II (INSTRUMENTS) ×2 IMPLANT
MANIPULATOR ADVINCU DEL 2.5 PL (MISCELLANEOUS) IMPLANT
MANIPULATOR ADVINCU DEL 3.0 PL (MISCELLANEOUS) IMPLANT
MANIPULATOR ADVINCU DEL 3.5 PL (MISCELLANEOUS) IMPLANT
MANIPULATOR ADVINCU DEL 4.0 PL (MISCELLANEOUS) IMPLANT
MESH VERTESSA LITE -Y 2X4X3 (Mesh General) ×2 IMPLANT
NDL INSUFFLATION 14GA 120MM (NEEDLE) ×2 IMPLANT
NEEDLE INSUFFLATION 14GA 120MM (NEEDLE) ×2 IMPLANT
OBTURATOR OPTICAL STANDARD 8MM (TROCAR) ×2
OBTURATOR OPTICAL STND 8 DVNC (TROCAR) ×2
OBTURATOR OPTICALSTD 8 DVNC (TROCAR) ×2 IMPLANT
PACK CYSTO (CUSTOM PROCEDURE TRAY) ×2 IMPLANT
PACK ROBOT WH (CUSTOM PROCEDURE TRAY) ×2 IMPLANT
PACK ROBOTIC GOWN (GOWN DISPOSABLE) ×2 IMPLANT
PAD OB MATERNITY 4.3X12.25 (PERSONAL CARE ITEMS) ×2 IMPLANT
PAD POSITIONING PINK XL (MISCELLANEOUS) ×2 IMPLANT
PAD PREP 24X48 CUFFED NSTRL (MISCELLANEOUS) ×2 IMPLANT
POUCH LAPAROSCOPIC INSTRUMENT (MISCELLANEOUS) IMPLANT
PROTECTOR NERVE ULNAR (MISCELLANEOUS) ×2 IMPLANT
SCISSORS LAP 5X35 DISP (ENDOMECHANICALS) IMPLANT
SEAL CANN UNIV 5-8 DVNC XI (MISCELLANEOUS) ×10 IMPLANT
SEAL XI 5MM-8MM UNIVERSAL (MISCELLANEOUS) ×10
SEALER VESSEL DA VINCI XI (MISCELLANEOUS) ×2
SEALER VESSEL EXT DVNC XI (MISCELLANEOUS) IMPLANT
SET IRRIG Y TYPE TUR BLADDER L (SET/KITS/TRAYS/PACK) ×2 IMPLANT
SET TUBE SMOKE EVAC HIGH FLOW (TUBING) ×2 IMPLANT
SPIKE FLUID TRANSFER (MISCELLANEOUS) ×4 IMPLANT
SPONGE T-LAP 4X18 ~~LOC~~+RFID (SPONGE) IMPLANT
SUT ABS MONO DBL WITH NDL 48IN (SUTURE) IMPLANT
SUT GORETEX NAB #0 THX26 36IN (SUTURE) ×2 IMPLANT
SUT MNCRL AB 4-0 PS2 18 (SUTURE) ×2 IMPLANT
SUT MON AB 2-0 SH 27 (SUTURE) ×2 IMPLANT
SUT V-LOC BARB 180 2/0GR6 GS22 (SUTURE)
SUT V-LOC BARB 180 2/0GR9 GS23 (SUTURE) ×4
SUT VIC AB 0 CT1 27 (SUTURE) ×4
SUT VIC AB 0 CT1 27XBRD ANTBC (SUTURE) ×4 IMPLANT
SUT VLOC 180 0 9IN  GS21 (SUTURE) ×4
SUT VLOC 180 0 9IN GS21 (SUTURE) IMPLANT
SUT VLOC 180 2-0 9IN GS21 (SUTURE) IMPLANT
SUTURE V-LC BRB 180 2/0GR6GS22 (SUTURE) IMPLANT
SUTURE V-LC BRB 180 2/0GR9GS23 (SUTURE) ×4 IMPLANT
TOWEL OR 17X26 10 PK STRL BLUE (TOWEL DISPOSABLE) ×2 IMPLANT

## 2022-03-06 NOTE — Anesthesia Preprocedure Evaluation (Addendum)
Anesthesia Evaluation  Patient identified by MRN, date of birth, ID band Patient awake    Reviewed: Allergy & Precautions, NPO status , Patient's Chart, lab work & pertinent test results  Airway Mallampati: II  TM Distance: >3 FB Neck ROM: Full    Dental  (+) Dental Advisory Given, Missing   Pulmonary neg pulmonary ROS   Pulmonary exam normal breath sounds clear to auscultation       Cardiovascular hypertension, Pt. on medications Normal cardiovascular exam Rhythm:Regular Rate:Normal     Neuro/Psych  Headaches PSYCHIATRIC DISORDERS Anxiety      Neuromuscular disease    GI/Hepatic negative GI ROS, Neg liver ROS,,,  Endo/Other  Obesity   Renal/GU negative Renal ROS   cystocele; posterior vaginal prolapse; uterovaginal prolapse incomplete    Musculoskeletal negative musculoskeletal ROS (+)    Abdominal   Peds  Hematology negative hematology ROS (+)   Anesthesia Other Findings Day of surgery medications reviewed with the patient.  Reproductive/Obstetrics                             Anesthesia Physical Anesthesia Plan  ASA: 2  Anesthesia Plan: General   Post-op Pain Management: Tylenol PO (pre-op)*, Gabapentin PO (pre-op)* and Toradol IV (intra-op)*   Induction: Intravenous  PONV Risk Score and Plan: 4 or greater and Scopolamine patch - Pre-op, Midazolam, Dexamethasone and Ondansetron  Airway Management Planned: Oral ETT  Additional Equipment:   Intra-op Plan:   Post-operative Plan: Extubation in OR  Informed Consent: I have reviewed the patients History and Physical, chart, labs and discussed the procedure including the risks, benefits and alternatives for the proposed anesthesia with the patient or authorized representative who has indicated his/her understanding and acceptance.     Dental advisory given  Plan Discussed with: CRNA  Anesthesia Plan Comments: (2nd PIV after  induction )       Anesthesia Quick Evaluation

## 2022-03-06 NOTE — Anesthesia Procedure Notes (Signed)
Procedure Name: Intubation Date/Time: 03/06/2022 8:39 AM  Performed by: Suan Halter, CRNAPre-anesthesia Checklist: Patient identified, Emergency Drugs available, Suction available and Patient being monitored Patient Re-evaluated:Patient Re-evaluated prior to induction Oxygen Delivery Method: Circle system utilized Preoxygenation: Pre-oxygenation with 100% oxygen Induction Type: IV induction Ventilation: Mask ventilation without difficulty Laryngoscope Size: Mac and 3 Grade View: Grade I Tube type: Oral Tube size: 7.0 mm Number of attempts: 1 Airway Equipment and Method: Stylet and Oral airway Placement Confirmation: ETT inserted through vocal cords under direct vision, positive ETCO2 and breath sounds checked- equal and bilateral Secured at: 22 cm Tube secured with: Tape Dental Injury: Teeth and Oropharynx as per pre-operative assessment

## 2022-03-06 NOTE — Op Note (Signed)
Operative Note  Preoperative Diagnosis: anterior vaginal prolapse, posterior vaginal prolapse, and uterovaginal prolapse, incomplete  Postoperative Diagnosis: same  Procedures performed:  Robotic assisted lysis of adhesions, total laparoscopic hysterectomy, bilateral salpingectomy, sacrocolpopexy Erenest Blank Lite Y), cystoscopy  Implants:  Implant Name Type Inv. Item Serial No. Manufacturer Lot No. LRB No. Used Action  MESH Valli Glance [id number] [employer] [place] [id number] N/A 1 Implanted    Attending Surgeon: Sherlene Shams, MD  Anesthesia: General endotracheal  Findings: 1. On vaginal exam, stage III prolapse noted  2. On laparoscopy, omental adhesions surrounding umbilicus. Normal appearing uterus and fallopian tubes post tubal ligation. Permanent suture noted in IP on left- picture in media.   3. On  cystoscopy, normal bladder and urethra without injury, lesion or foreign body. Brisk bilateral ureteral efflux noted.   Specimens:  ID Type Source Tests Collected by Time Destination  1 : uterus, cervix, and bilateral fallopian tubes Tissue PATH Gyn benign resection SURGICAL PATHOLOGY Jaquita Folds, MD 03/06/2022 1002     Estimated blood loss: 25 mL  IV fluids: 900 mL  Urine output: 956 mL  Complications: none  Procedure in Detail:  After informed consent was obtained, the patient was taken to the operating room, where general anesthesia was induced and found to be adequate. She was placed in dorsolithotomy position in yellowfin stirrups. Her hips were noted not to be hyperflexed or hyperextended. Her arms were padded with gel pads and tucked to her sides. Her hands were surrounded by foam. A padded strap was placed across her chest with foam between the pad and her skin. She was noted to be appropriately positioned with all pressure points well padded and off tension. A tilt test showed no slippage. She was prepped and  draped in the usual sterile fashion. A uterine manipulator was placed in the uterus after sounding to 10 cm, an appropriately sized Koh ring was placed around the cervix, and a pneumo-occluder balloon was positioned in the vagina for later use.  A sterile Foley catheter was inserted.   0.25% plain Marcaine was injected in the supraumbilical  area and an 8 mm supraumbilical skin incision was made with the scalpel.  A Veress needle was inserted into the incision, CO2 insufflation was started, a low opening pressure was noted, and pneumoperitoneum was obtained. The Veress needle was removed and a 39m robotic trocar was placed. The robotic camera was inserted and intraperitoneal placement was confirmed. Survey of the abdomen and pelvis revealed the findings as noted above, specifically adhesions surrounding the umbilicus. The sacrum appeared to be free of any adhesive disease. After determining placement for the other ports, Local anesthetic was injected at each site and two 8 mm incisions were made for robotic ports at 10 cm lateral to and at the level of the umbilical port. Two additional 8 mm incisions were made 10 cm lateral to these and 30 degrees down followed by 8 mm robotic ports - the right side for an assistant port. All trocars were placed sequentially under direct visualization of the camera. The patient was placed in Trendelenburg. The camera was placed through the left side port. Monopolar scissors were used to take down the omental adhesions surrounding the umbilical port with cautery. A clip was noted on the peritoneum at the umbilicus. Good hemostasis was obtained.    The robot was docked on the patient's right side. Monopolar endoshears alternating with a vessel sealer was placed in  the right arm, a [place] bipolar grasper was placed in the 2nd arm of the patient's left side, and a Tip up grasper was placed in the 3rd arm on the patient's left side.   Attention was then turned to the robotic  hysterectomy and salpingectomy. The left IP was noted to have a permanent suture plicated along its length.  The ureter was identified and was found to be well away from the planned site of incision.The left fallopian tube was grasped and the mesosalpinx was cauterized and cut. This was repeated on the right side and the tubes were handed off the field.  The right uteroovarian ligament was cauterized and cut. The right round ligament was grasped, cauterized, and transected with electrocautery. The anterior and posterior leaves of the broad ligament were taken down with cautery and sharp dissection. The uterine artery was skeletonized and the bladder flap was created on the right side with a combination of electrosurgery and sharp dissection. The KOH ring was identified. The right uterine artery was clamped, cauterized, and transected. In a similar fashion, the left side was taken down. Further sharp dissection with combination of cautery was performed to further develop the bladder flap. At this point, the KOH ring was completely hugging the cervix. The pneumo-occluder balloon in the vagina was inflated to maintain pneumoperitoneum. A colpotomy was performed with electrosurgical cutting current and the uterus and cervix were completely amputated from the vagina. The specimen was delivered through the vagina. The posterior portion of the vaginal cuff was then grasped and pulled up to maintain pneumoperitoneum. The pneumo-occluder balloon was then replaced in the vagina. The right hand instrument was changed to a suture-cut needle driver. The vaginal cuff was then closed using a 0 V-lock suture in two layers.    The right hand instrument was replaced with monopolar endoshears. With a lucite probe in the vagina, the anterior vaginal dissection was then performed with sharp dissection and electrosurgery. The posterior vaginal dissection was then performed with sharp dissection and electrosurgery in order to dissect  the rectum away from the posterior vagina. Attention was then turned to the sacral promontory. The peritoneum overlying the sacral promontory was tented up, dissected sharply with monopolar scissors and electrosurgery using layer by layer technique. The peritoneal incision was extended down to the posterior cul-de-sac. This was performed with care to avoid the ureter on the right side and the sigmoid colon and its mesentary on the left side.  A "Y" mesh was then inserted into the abdomen after trimming to appropriate size. With the probe in the vagina, the anterior leaf of the Y mesh was affixed to the anterior portion of the vagina using a 2-0 v-loc suture in a spiral pattern to distribute the suture evenly across the surface of the anterior mesh leaf. In a similar fashion, the posterior leaf of the Y mesh was attached to the posterior surface of the vagina with 2-0 v-loc suture.  The distal end of the mesh was then brought to overlie the sacrum. The correct amount of tension was determined in order to elevate the vagina, but not put the mesh under tension. The distal end of the mesh was then affixed to the anterior longitudinal sacral ligament using two interrupted transverse stitches of CV2 Gortex. The excess distal mesh was then cut and removed. The peritoneum was reapproximated over the mesh using 2-0 monocryl. The bladder flap was incorporated to completely retroperitonealize the mesh. All pedicles were carefully inspected and noted to be hemostatic  as the CO2 gas was deflated. All instruments were removed from the patient's abdomen.   The Foley catheter was removed.  A 70-degree cystoscope was introduced, and 360-degree inspection revealed no injury, lesion or foreign body in the bladder. Brisk bilateral ureteral efflux was noted with the assistance of pyridium.  The bladder was drained and the cystoscope was removed.  The Foley catheter was replaced.  The robot was undocked. The CO2 gas was removed and  the ports were removed.  The skin incisions were closed with subcutaneous stitches of 4-0 Monocryl and covered with skin glue.  Sponge, lap, and needle counts were correct x 2. The patient tolerated the procedure well. She was awakened from anesthesia and transferred to the recovery room in stable condition.   Jaquita Folds, MD

## 2022-03-06 NOTE — Transfer of Care (Signed)
Immediate Anesthesia Transfer of Care Note  Patient: Katelyn Brown  Procedure(s) Performed: XI ROBOTIC ASSISTED TOTAL HYSTERECTOMY WITH BILATERAL SALPINGECTOMY AND SACROCOLPOPEXY (Pelvis) CYSTOSCOPY (Bladder)  Patient Location: PACU  Anesthesia Type:General  Level of Consciousness: drowsy  Airway & Oxygen Therapy: Patient Spontanous Breathing and Patient connected to nasal cannula oxygen  Post-op Assessment: Report given to RN and Post -op Vital signs reviewed and stable  Post vital signs: Reviewed and stable  Last Vitals:  Vitals Value Taken Time  BP    Temp    Pulse 83 03/06/22 1139  Resp    SpO2 96 % 03/06/22 1139  Vitals shown include unvalidated device data.  Last Pain:  Vitals:   03/06/22 0721  TempSrc: Oral  PainSc: 2       Patients Stated Pain Goal: 4 (85/63/14 9702)  Complications: No notable events documented.

## 2022-03-06 NOTE — Interval H&P Note (Signed)
History and Physical Interval Note:  03/06/2022 9:29 AM  Katelyn Brown  has presented today for surgery, with the diagnosis of cystocele; posterior vaginal prolapse; uterovaginal prolapse incomplete.  The various methods of treatment have been discussed with the patient and family. After consideration of risks, benefits and other options for treatment, the patient has consented to  Procedure(s) with comments: XI ROBOTIC ASSISTED TOTAL HYSTERECTOMY WITH BILATERAL SALPINGECTOMY AND SACROCOLPOPEXY (N/A)  CYSTOSCOPY (N/A) as a surgical intervention.  The patient's history has been reviewed, patient examined, no change in status, stable for surgery.  I have reviewed the patient's chart and labs.  Questions were answered to the patient's satisfaction.     Jaquita Folds

## 2022-03-06 NOTE — Discharge Instructions (Signed)

## 2022-03-06 NOTE — Progress Notes (Addendum)
1645  During attempt at ambulation after 50 ft, pt. Became dizzy, unsteady on her feet and unable to continue ambulation. Vital signs stable, incision WNL and peripads WNL no bleeding noted. Abdomen soft, mildly tender to touch. Pt. Assisted back to bed with two RN's. Dr. Wannetta Sender called and made aware. No new orders at this time. Will re attempt ambulation after allowing patient time to rest and eat.  Pt. States she has had vertigo in the past but it has been years since her last flare up. Katelyn Brown, Arville Lime

## 2022-03-07 ENCOUNTER — Telehealth: Payer: Self-pay | Admitting: Obstetrics and Gynecology

## 2022-03-07 ENCOUNTER — Encounter (HOSPITAL_BASED_OUTPATIENT_CLINIC_OR_DEPARTMENT_OTHER): Payer: Self-pay | Admitting: Obstetrics and Gynecology

## 2022-03-07 DIAGNOSIS — K5900 Constipation, unspecified: Secondary | ICD-10-CM

## 2022-03-07 MED ORDER — GLYCERIN (ADULT) 2 G RE SUPP: 1.0000 | RECTAL | 0 refills | Status: DC | PRN

## 2022-03-07 NOTE — Telephone Encounter (Signed)
Katelyn Brown underwent robotic assisted total laparoscopic hysterectomy with bilateral salpingectomy, sacrocolpopexy, cystoscopy on 03/06/22.   She passed her voiding trial.  352m was backfilled into the bladder Voided 2572m PVR by bladder scan was 10525m  She was discharged without a catheter. Please call her for a routine post op check. Thanks!  MicJaquita FoldsD

## 2022-03-07 NOTE — Anesthesia Postprocedure Evaluation (Signed)
Anesthesia Post Note  Patient: Katelyn Brown  Procedure(s) Performed: XI ROBOTIC ASSISTED TOTAL HYSTERECTOMY WITH BILATERAL SALPINGECTOMY AND SACROCOLPOPEXY (Pelvis) CYSTOSCOPY (Bladder)     Patient location during evaluation: PACU Anesthesia Type: General Level of consciousness: awake and alert Pain management: pain level controlled Vital Signs Assessment: post-procedure vital signs reviewed and stable Respiratory status: spontaneous breathing, nonlabored ventilation, respiratory function stable and patient connected to nasal cannula oxygen Cardiovascular status: blood pressure returned to baseline and stable Postop Assessment: no apparent nausea or vomiting Anesthetic complications: no   No notable events documented.  Last Vitals:  Vitals:   03/06/22 1657 03/06/22 2000  BP: 135/84 (!) 144/89  Pulse: 86 80  Resp:  16  Temp:  36.9 C  SpO2: 99% 99%    Last Pain:  Vitals:   03/06/22 2133  TempSrc:   PainSc: [place] Sundai Probert

## 2022-03-07 NOTE — Telephone Encounter (Signed)
Patient reports she has not been able to have a BM as of yet. Will send glycerin suppositories at her request.  Reports she has not been having any bleeding  Pain 4/10. Reports she took tylenol and oxycodone as well as miralax. Would like to try the glycerin suppositories, will send those to the   Reports normal urination.   Encouraged patient to call or message with any questions or concerns.

## 2022-03-08 LAB — SURGICAL PATHOLOGY

## 2022-03-15 ENCOUNTER — Encounter: Payer: Self-pay | Admitting: Obstetrics and Gynecology

## 2022-03-15 ENCOUNTER — Telehealth: Payer: Self-pay | Admitting: Obstetrics and Gynecology

## 2022-03-15 NOTE — Telephone Encounter (Signed)
Called patient. She is s/p Robotic assisted lysis of adhesions, total laparoscopic hysterectomy, bilateral salpingectomy, sacrocolpopexy Erenest Blank Lite Y), cystoscopy   She is feeling some shortness of breath as of two days ago. Denies chest pain but just feels hard to take a deep breath. She has been having a lot of pain in the left shoulder/ rotator cuff- feels that this is limiting her movement. Has just been taking tylenol and tried to limit the oxycodone due to constipation. She had been sent ibuprofen but it was never given to her at the pharmacy.   She has been constipated. She is taking the miralax and stool softener. She had a BM last 2 days ago, was looser at that time.   Advised her to continue with her bowel regimen. She should obtain ibuprofen and can take 800 mg every 8 hours. She has seen sports medicine and was planning and injection with them- plan to follow up next week with them to address the pain. If she has worsening shortness of breath, she should be evaluated in the ER, and she expressed understanding. She will call with any further concerns.   Jaquita Folds, MD

## 2022-03-18 ENCOUNTER — Telehealth: Payer: Self-pay

## 2022-03-18 NOTE — Telephone Encounter (Signed)
I will reopen the referral for patient.  Katelyn Brown,CMA

## 2022-03-18 NOTE — Telephone Encounter (Signed)
Patient calls nurse line requesting a referral to Ortho for her shoulder.   Patient was referred back in November, however according to the notes she denied the referral and it was closed.   Patient reports she is not interested in going to physical therapy at this time.   Will forward to PCP and referral coordinator.  Unsure if she would need a new referral.

## 2022-03-25 ENCOUNTER — Other Ambulatory Visit: Payer: Self-pay

## 2022-03-25 ENCOUNTER — Emergency Department (HOSPITAL_BASED_OUTPATIENT_CLINIC_OR_DEPARTMENT_OTHER): Payer: Medicaid Other

## 2022-03-25 ENCOUNTER — Emergency Department (HOSPITAL_COMMUNITY)
Admission: EM | Admit: 2022-03-25 | Discharge: 2022-03-26 | Disposition: A | Discharge status: Home / Self Care | Payer: Self-pay | Attending: Emergency Medicine | Admitting: Emergency Medicine

## 2022-03-25 ENCOUNTER — Telehealth: Payer: Self-pay | Admitting: Obstetrics and Gynecology

## 2022-03-25 ENCOUNTER — Emergency Department (HOSPITAL_COMMUNITY): Payer: Self-pay

## 2022-03-25 ENCOUNTER — Emergency Department (HOSPITAL_COMMUNITY): Payer: Medicaid Other

## 2022-03-25 DIAGNOSIS — G8929 Other chronic pain: Secondary | ICD-10-CM | POA: Insufficient documentation

## 2022-03-25 DIAGNOSIS — M25512 Pain in left shoulder: Secondary | ICD-10-CM | POA: Insufficient documentation

## 2022-03-25 DIAGNOSIS — I10 Essential (primary) hypertension: Secondary | ICD-10-CM | POA: Insufficient documentation

## 2022-03-25 DIAGNOSIS — R1031 Right lower quadrant pain: Secondary | ICD-10-CM | POA: Insufficient documentation

## 2022-03-25 DIAGNOSIS — A599 Trichomoniasis, unspecified: Secondary | ICD-10-CM | POA: Insufficient documentation

## 2022-03-25 DIAGNOSIS — Z79899 Other long term (current) drug therapy: Secondary | ICD-10-CM | POA: Insufficient documentation

## 2022-03-25 DIAGNOSIS — Z01818 Encounter for other preprocedural examination: Secondary | ICD-10-CM

## 2022-03-25 DIAGNOSIS — R52 Pain, unspecified: Secondary | ICD-10-CM

## 2022-03-25 DIAGNOSIS — Z1152 Encounter for screening for COVID-19: Secondary | ICD-10-CM | POA: Insufficient documentation

## 2022-03-25 LAB — CBC
HCT: 35.7 % — ABNORMAL LOW (ref 36.0–46.0)
Hemoglobin: 11.6 g/dL — ABNORMAL LOW (ref 12.0–15.0)
MCH: 28.3 pg (ref 26.0–34.0)
MCHC: 32.5 g/dL (ref 30.0–36.0)
MCV: 87.1 fL (ref 80.0–100.0)
Platelets: 352 K/uL (ref 150–400)
RBC: 4.1 MIL/uL (ref 3.87–5.11)
RDW: 14 % (ref 11.5–15.5)
WBC: 12.4 K/uL — ABNORMAL HIGH (ref 4.0–10.5)
nRBC: 0 % (ref 0.0–0.2)

## 2022-03-25 LAB — RESP PANEL BY RT-PCR (RSV, FLU A&B, COVID)  RVPGX2
Influenza A by PCR: NEGATIVE
Influenza B by PCR: NEGATIVE
Resp Syncytial Virus by PCR: NEGATIVE
SARS Coronavirus 2 by RT PCR: NEGATIVE

## 2022-03-25 LAB — I-STAT BETA HCG BLOOD, ED (MC, WL, AP ONLY): I-stat hCG, quantitative: <5 m[IU]/mL

## 2022-03-25 LAB — BASIC METABOLIC PANEL WITH GFR
Anion gap: 9 (ref 5–15)
BUN: 10 mg/dL (ref 6–20)
CO2: 23 mmol/L (ref 22–32)
Calcium: 8.6 mg/dL — ABNORMAL LOW (ref 8.9–10.3)
Chloride: 107 mmol/L (ref 98–111)
Creatinine, Ser: 0.68 mg/dL (ref 0.44–1.00)
GFR, Estimated: >60 mL/min
Glucose, Bld: 99 mg/dL (ref 70–99)
Potassium: 3.6 mmol/L (ref 3.5–5.1)
Sodium: 139 mmol/L (ref 135–145)

## 2022-03-25 LAB — URINALYSIS, ROUTINE W REFLEX MICROSCOPIC
Bilirubin Urine: NEGATIVE
Glucose, UA: NEGATIVE mg/dL
Ketones, ur: NEGATIVE mg/dL
Nitrite: NEGATIVE
Protein, ur: NEGATIVE mg/dL
Specific Gravity, Urine: 1.021 (ref 1.005–1.030)
pH: 6 (ref 5.0–8.0)

## 2022-03-25 LAB — LACTIC ACID, PLASMA: Lactic Acid, Venous: 0.9 mmol/L (ref 0.5–1.9)

## 2022-03-25 LAB — WET PREP, GENITAL
Clue Cells Wet Prep HPF POC: NONE SEEN
Sperm: NONE SEEN
WBC, Wet Prep HPF POC: <10 (ref <10)
Yeast Wet Prep HPF POC: NONE SEEN

## 2022-03-25 LAB — TROPONIN I (HIGH SENSITIVITY): Troponin I (High Sensitivity): <2 ng/L (ref <18)

## 2022-03-25 MED ORDER — METRONIDAZOLE 500 MG PO TABS: 500.0000 mg | ORAL_TABLET | Freq: Two times a day (BID) | ORAL | 0 refills | Status: AC

## 2022-03-25 MED ORDER — METRONIDAZOLE 500 MG PO TABS
500.0000 mg | ORAL_TABLET | Freq: Once | ORAL | Status: AC
  Administered 2022-03-25: 500 mg via ORAL
  Filled 2022-03-25: qty 1

## 2022-03-25 MED ORDER — IOHEXOL 350 MG/ML SOLN
100.0000 mL | Freq: Once | INTRAVENOUS | Status: AC | PRN
  Administered 2022-03-25: 100 mL via INTRAVENOUS

## 2022-03-25 MED ORDER — IBUPROFEN 600 MG PO TABS: 600.0000 mg | ORAL_TABLET | Freq: Four times a day (QID) | ORAL | 0 refills | Status: AC | PRN

## 2022-03-25 NOTE — ED Triage Notes (Signed)
Pt c/o CP and SOB for the last several days. Pt states that she recently had hysterectomy on 1/17.

## 2022-03-25 NOTE — Progress Notes (Signed)
VASCULAR LAB    Left upper extremity venous duplex has been performed.  See CV proc for preliminary results.   Zackerie Sara, RVT 03/25/2022, 7:07 PM

## 2022-03-25 NOTE — Discharge Instructions (Addendum)
Your workup today was remarkable for trichomoniasis.  Otherwise it was fairly unremarkable.  Urinalysis is otherwise reassuring.  Follow-up as above.  Take medications as prescribed.

## 2022-03-25 NOTE — ED Provider Triage Note (Addendum)
Emergency Medicine Provider Triage Evaluation Note  Katelyn Brown , a 52 y.o. female  was evaluated in triage.  Pt complains of chest pain shortness of breath.  Patient recently had a hysterectomy on March 06, 2022 and since then has been having progressive chest pain and shortness of breath.  Patient states symptoms are worse when lying down or exerting herself and that she finds her to have out of breath quite easily.  Patient states pain is substernal 6 out of 10 that is throughout her chest and radiates to her back.  Patient is not on any blood thinners.  Patient states she has been noticeably weaker.  Patient denied nausea/vomiting/diarrhea, syncope, dysphagia, skin color changes  Review of Systems  Positive: See HPI Negative: See HPI  Physical Exam  BP (!) 146/107 (BP Location: Right Arm)   Pulse 87   Temp 98.6 F (37 C) (Oral)   Resp 16   SpO2 100%  Gen:   Awake, no distress   Resp:  Normal effort  MSK:   Moves extremities without difficulty  Other:  Sensation intact in both hands, 4 out of 5 bilateral grip strength, less than 2-second cap refill; tachycardic; lungs clear bilaterally  Medical Decision Making  Medically screening exam initiated at 3:10 PM.  Appropriate orders placed.  [name] was informed that the remainder of the evaluation will be completed by another provider, this initial triage assessment does not replace that evaluation, and the importance of remaining in the ED until their evaluation is complete.  Workup initiated.  Patient is not in any distress at this time   [name] 03/25/22 1521    Chuck Hint, PA-C 03/25/22 1527

## 2022-03-25 NOTE — Telephone Encounter (Signed)
Patient called stating she is having significant shoulder pain and shortness of breath. Patient does have an MRI on 01/03/22 showing Significant infraspinatus and supraspinatus tendinopathy/tendinosis with interstitial tears along with degenerative fraying and fibrillation along the bursal and articular Surfaces and  Small full-thickness tear at the junction of the anterior fibers of the supraspinatus tendon and the superior fibers of the subscapularis tendon.  Patient had a follow up appointment with Ortho or sports med and could not afford the co-pay so was unable to go to the appointment.   Patient is describing shortness of breath when walking upstairs and states she feels like her heart is racing. No hx of CHF or other hear disease or COPD. Unable to discern if this is cardiac in nature or MSK. Informed patient that with her shortness of breath and shoulder pain it could be cardiac in nature and she should seek evaluation at the ER.   We discussed that the free standing ERs and the Urgent Cares do not have cath lab on site if she were to need cardiac intervention and she should go to an ER with catheterization capabilities and imaging readily available. We discussed that with her symptoms they would likely perform EKG, Blood tests (troponin or BNP) and possible cardiac imaging to rule out heart attack or blood clot. She reports understanding and plans to go to the ER for her symptoms.
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
## 2022-03-25 NOTE — ED Notes (Signed)
Pt ambulatory to restroom

## 2022-03-26 ENCOUNTER — Ambulatory Visit (INDEPENDENT_AMBULATORY_CARE_PROVIDER_SITE_OTHER): Payer: Self-pay | Admitting: Obstetrics and Gynecology

## 2022-03-26 ENCOUNTER — Encounter: Payer: Self-pay | Admitting: Obstetrics and Gynecology

## 2022-03-26 VITALS — BP 129/88 | HR 84

## 2022-03-26 DIAGNOSIS — Z9889 Other specified postprocedural states: Secondary | ICD-10-CM

## 2022-03-26 LAB — GC/CHLAMYDIA PROBE AMP (~~LOC~~) NOT AT ARMC
Chlamydia: NEGATIVE
Comment: NEGATIVE
Comment: NORMAL
Neisseria Gonorrhea: NEGATIVE

## 2022-03-26 MED ORDER — ACETAMINOPHEN 325 MG PO TABS
650.0000 mg | ORAL_TABLET | Freq: Once | ORAL | Status: DC
  Filled 2022-03-26: qty 2

## 2022-03-26 NOTE — Progress Notes (Signed)
[facility] Return Visit  SUBJECTIVE  History of Present Illness: Katelyn Brown is a 52 y.o. female seen in follow-up for ER visit and Post op.     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   Past Surgical History: She  has a past surgical history that includes Facial fracture surgery (1993); Tubal ligation (1996); Eye surgery (1993); Fracture surgery; Cholecystectomy (N/A, 03/15/2013); Embolization (2015); Liposuction (2019); Endometrial biopsy (11/27/2021); Xi robotic assisted total hysterectomy with sacrocolpopexy (N/A, 03/06/2022); and Cystoscopy (N/A, 03/06/2022).   Medications: She has a current medication list which includes the following prescription(s): acetaminophen, amlodipine, fluticasone, glycerin adult, ibuprofen, metronidazole, oxycodone, polyethylene glycol powder, and triamcinolone ointment.   Allergies: Patient has No Known Allergies.   Social History: Patient  reports that she has never smoked. She has never used smokeless tobacco. She reports current alcohol use. She reports that she does not use drugs.      OBJECTIVE     Physical Exam: Vitals:   03/26/22 1102  BP: 129/88  Pulse: 84   Gen: No apparent distress, A&O x 3.  Detailed Urogynecologic Evaluation:  Deferred due to ER visit and full work up last night     [place]    Katelyn Brown is a 52 y.o. with:  1. Post-operative state    Patient had a full  evaluation last night at [place]. She was diagnosed with Trichomonas infection based on Wet prep. Patient was prescribed Flagyl by ER physician. She also got a CT Abdomen Pelvis with contrast and a CT angio chest to r/o PE due to her shortness of breath which was negative for PE.   Patient needs to follow up with orthopedics for her shoulder but is unable to afford the co-pay of 90$ at this time.   Provided words of encouragement and reviewed some of the testing last night with patient. She is aware that we do not have a solid explanation for her shortness of breath, but do not believe it is related to her recent surgery.   Patient to follow up with Dr. Wannetta Sender for routine post op in 3 weeks.

## 2022-03-26 NOTE — ED Provider Notes (Signed)
Patient signed out pending urinalysis.  Urinalysis reviewed.  Not a clear-cut UTI.  Urine culture is pending.  Will hold treatment.  Patient is being treated for trichomonas.  Otherwise her workup has been largely reassuring.  Recommend follow-up with her OB/GYN and urology as provided in her discharge summary by prior provider.  Physical Exam  BP (!) 141/103   Pulse 81   Temp (!) 97.5 F (36.4 C) (Oral)   Resp 16   Ht 1.651 m ('5\' 5"'$ )   Wt 90.7 kg   SpO2 96%   BMI 33.28 kg/m   Procedures  Procedures  ED Course / MDM   Clinical Course as of 03/26/22 0154  Mon Mar 25, 2022  1654 WBC(!): 12.4 Mild leukocytosis [HN]  7793 Basic metabolic panel(!) urnemarkable [HN]  1654 Troponin I (High Sensitivity): <2 [HN]  9030 DG Chest 2 View FINDINGS: Heart size and mediastinal contours are stable. Lungs are clear. No pleural effusion or pneumothorax is seen. Osseous structures about the chest are unremarkable.  IMPRESSION: No active cardiopulmonary disease. No evidence of pneumonia or pulmonary edema.   [HN]  1823 Lactic Acid, Venous: 0.9 [HN]  1843 CT Angio Chest PE W and/or Wo Contrast 1. No acute intrathoracic pathology. No CT evidence of pulmonary artery embolus. 2. No acute intra-abdominal or pelvic pathology. 3. Moderate colonic stool burden. No bowel obstruction. Normal appendix. 4. Small free fluid within the pelvis, likely related to recent surgery. A 1 cm calcific focus noted within the fluid in the posterior pelvis. 5.  Aortic Atherosclerosis (ICD10-I70.0).   [HN]  1916 Neg LUE DVT US [HN]  2214 Trich, Wet Prep(!): PRESENT [HN]  2236 D/w OB/Gyn who does not have any further recommendations besides flagyl and f/u in clinic. [HN]    Clinical Course User Index [HN] Audley Hose, MD   Medical Decision Making Amount and/or Complexity of Data Reviewed Labs: ordered. Decision-making details documented in ED Course. Radiology: ordered. Decision-making details  documented in ED Course.  Risk OTC drugs. Prescription drug management.   Problem List Items Addressed This Visit       Other   Chronic left shoulder pain   Relevant Medications   ibuprofen (ADVIL) 600 MG tablet   acetaminophen (TYLENOL) tablet 650 mg (Start on 03/26/2022  2:00 AM)   Other Visit Diagnoses     Trichomoniasis    -  Primary   Relevant Medications   metroNIDAZOLE (FLAGYL) tablet 500 mg (Completed)   metroNIDAZOLE (FLAGYL) 500 MG tablet   Right lower quadrant abdominal pain       Pre-op evaluation       Relevant Medications   ibuprofen (ADVIL) 600 MG tablet             Merryl Hacker, MD 03/26/22 [id number]

## 2022-03-26 NOTE — ED Notes (Signed)
Lab called to add on urine culture.  ?

## 2022-03-26 NOTE — ED Notes (Signed)
I provided reinforced discharge education based off of after visit summary/care provided. Pt acknowledged and understood my education. Pt had no further questions/concerns for provider/myself. After visit summary provided to pt.

## 2022-03-27 ENCOUNTER — Telehealth: Payer: Self-pay

## 2022-03-27 LAB — URINE CULTURE
Culture: <10000 — AB
Special Requests: NORMAL

## 2022-03-27 NOTE — Telephone Encounter (Signed)
-----   Message from [name], MD sent at 03/27/2022  8:42 AM EST ----- Needs post op visit, not urgent   Katelyn Brown  ----- Message ----- From: Berton Mount, NP Sent: 03/26/2022  11:29 AM EST To: [name], MD

## 2022-03-27 NOTE — Telephone Encounter (Signed)
Spoke with patient. Made appt for March 8th at 2:10. Salvatore Marvel, CMA

## 2022-04-08 ENCOUNTER — Encounter: Payer: Self-pay | Admitting: *Deleted

## 2022-04-17 ENCOUNTER — Ambulatory Visit (INDEPENDENT_AMBULATORY_CARE_PROVIDER_SITE_OTHER): Payer: Self-pay | Admitting: Obstetrics and Gynecology

## 2022-04-17 ENCOUNTER — Encounter: Payer: Self-pay | Admitting: Obstetrics and Gynecology

## 2022-04-17 VITALS — BP 128/84 | HR 78

## 2022-04-17 DIAGNOSIS — N393 Stress incontinence (female) (male): Secondary | ICD-10-CM

## 2022-04-17 DIAGNOSIS — Z9889 Other specified postprocedural states: Secondary | ICD-10-CM

## 2022-04-17 NOTE — Progress Notes (Signed)
[facility]  Date of Visit: 04/17/2022  History of Present Illness: Katelyn Brown is a 52 y.o. female scheduled today for a post-operative visit.   Surgery: s/p Robotic assisted lysis of adhesions, total laparoscopic hysterectomy, bilateral salpingectomy, sacrocolpopexy Erenest Blank Lite Y), cystoscopy on 03/06/22  She passed her postoperative void trial.   Postoperative course has been complicated by shortness of breath. Was evaluated by ED on 03/25/22. CT Angio for PE and CT A/P negative. She was diagnosed with trichomonas and placed on flagyl.   Today she reports still have the rotator cuff pain which has been bothering her. Overall shortness of breath has resolved.   UTI in the last 6 weeks? No  Pain? No , other than shoulder She has returned to her normal activity (except for postop restrictions) Vaginal bulge? No  Stress incontinence: Yes - happens if she coughs or sneezes Urgency/frequency: No  Urge incontinence: No  Voiding dysfunction: No  Bowel issues: Yes , some constipation due to IBS but has not been straining  Subjective Success: Do you usually have a bulge or something falling out that you can see or feel in the vaginal area? No  Retreatment Success: Any retreatment with surgery or pessary for any compartment? No   Pathology results: UTERUS WITH RIGHT AND LEFT FALLOPIAN TUBE, HYSTERECTOMY AND BILATERAL  SALPINGECTOMY:  Benign leiomyomata, intramural and submucosal, measuring up to 1.4 cm in  greatest dimension  Adenomyosis  Benign endometrial polyp  Uterine serosal adhesions  Benign proliferative phase endometrium  Acute and chronic cervicitis with squamous metaplasia  Benign fallopian tubes   Medications: She has a current medication list which includes the following prescription(s): acetaminophen, amlodipine, cyclobenzaprine, fluticasone, glycerin adult, oxycodone, polyethylene glycol powder, and triamcinolone ointment.   Allergies: Patient has No  Known Allergies.   Physical Exam: BP 128/84   Pulse 78   LMP 02/20/2022 (Exact Date)   Abdomen: soft, non-tender, without masses or organomegaly Laparoscopic Incisions: well healed Pelvic Examination: Vagina: Incisions healing well. Sutures are present at the cuff and there is not granulation tissue. No tenderness along the anterior or posterior vagina. No apical tenderness. No pelvic masses. No visible or palpable mesh.  POP-Q: POP-Q  -3                                            Aa   -3                                           Ba  -9                                              C   4                                            Gh  3.5  Pb  9                                            tvl   -2.5                                            Ap  -2.5                                            Bp                                                 D     ---------------------------------------------------------  Assessment and Plan:  1. Post-operative state   2. SUI (stress urinary incontinence, female)     - Pathology results were reviewed with the patient today and she verbalized understanding that the results were benign.  - Can resume regular activity including exercise. Wait an additional 6 weeks for intercourse.  - Discussed avoidance of heavy lifting and straining long term to reduce the risk of recurrence. Try to be consistent with bowel regimen.  - For SUI symptoms, we discussed that this can improve with time. Simple CMG prior to surgery did not demonstrate incontinence. We reviewed options of pelvic PT, urethral bulking or sling if symptoms are persistent.    Return for 1 month follow up for SUI symptoms  Jaquita Folds, MD

## 2022-04-26 ENCOUNTER — Ambulatory Visit: Payer: Medicaid Other | Admitting: Student

## 2022-04-26 NOTE — Progress Notes (Deleted)
  SUBJECTIVE:   CHIEF COMPLAINT / HPI:   Patient p/f post op visit S/p robotic assisted lysis of adhesions, total laparoscopic hysterectomy, bilateral salpingectomy, sacrocolpopexy cystoscopy on 03/06/22. Was experiencing dyspnea after the initial surgery and saw an ED provider on 03/25/2022.  CTA PE was negative.  She was found to have trichomonas that was treated.    PERTINENT  PMH / PSH:     Patient Care Team: [name], MD as PCP - General (Family Medicine) OBJECTIVE:  LMP 02/20/2022 (Exact Date)  Physical Exam   ASSESSMENT/PLAN:  There are no diagnoses linked to this encounter. No follow-ups on file. [name], MD 04/26/2022, 1:05 PM PGY-2, [place] {    This will disappear when note is signed, click to select method of visit    :1}

## 2022-05-24 IMAGING — CR DG HIP (WITH OR WITHOUT PELVIS) 2-3V*R*
3 series · 3 of 3 positions shown · non-contrast
Comparison: None.

CLINICAL DATA: Right hip pain common no known injury, initial
encounter

EXAM:
DG HIP (WITH OR WITHOUT PELVIS) 3V RIGHT

[w pelvis * (1 of 3)]
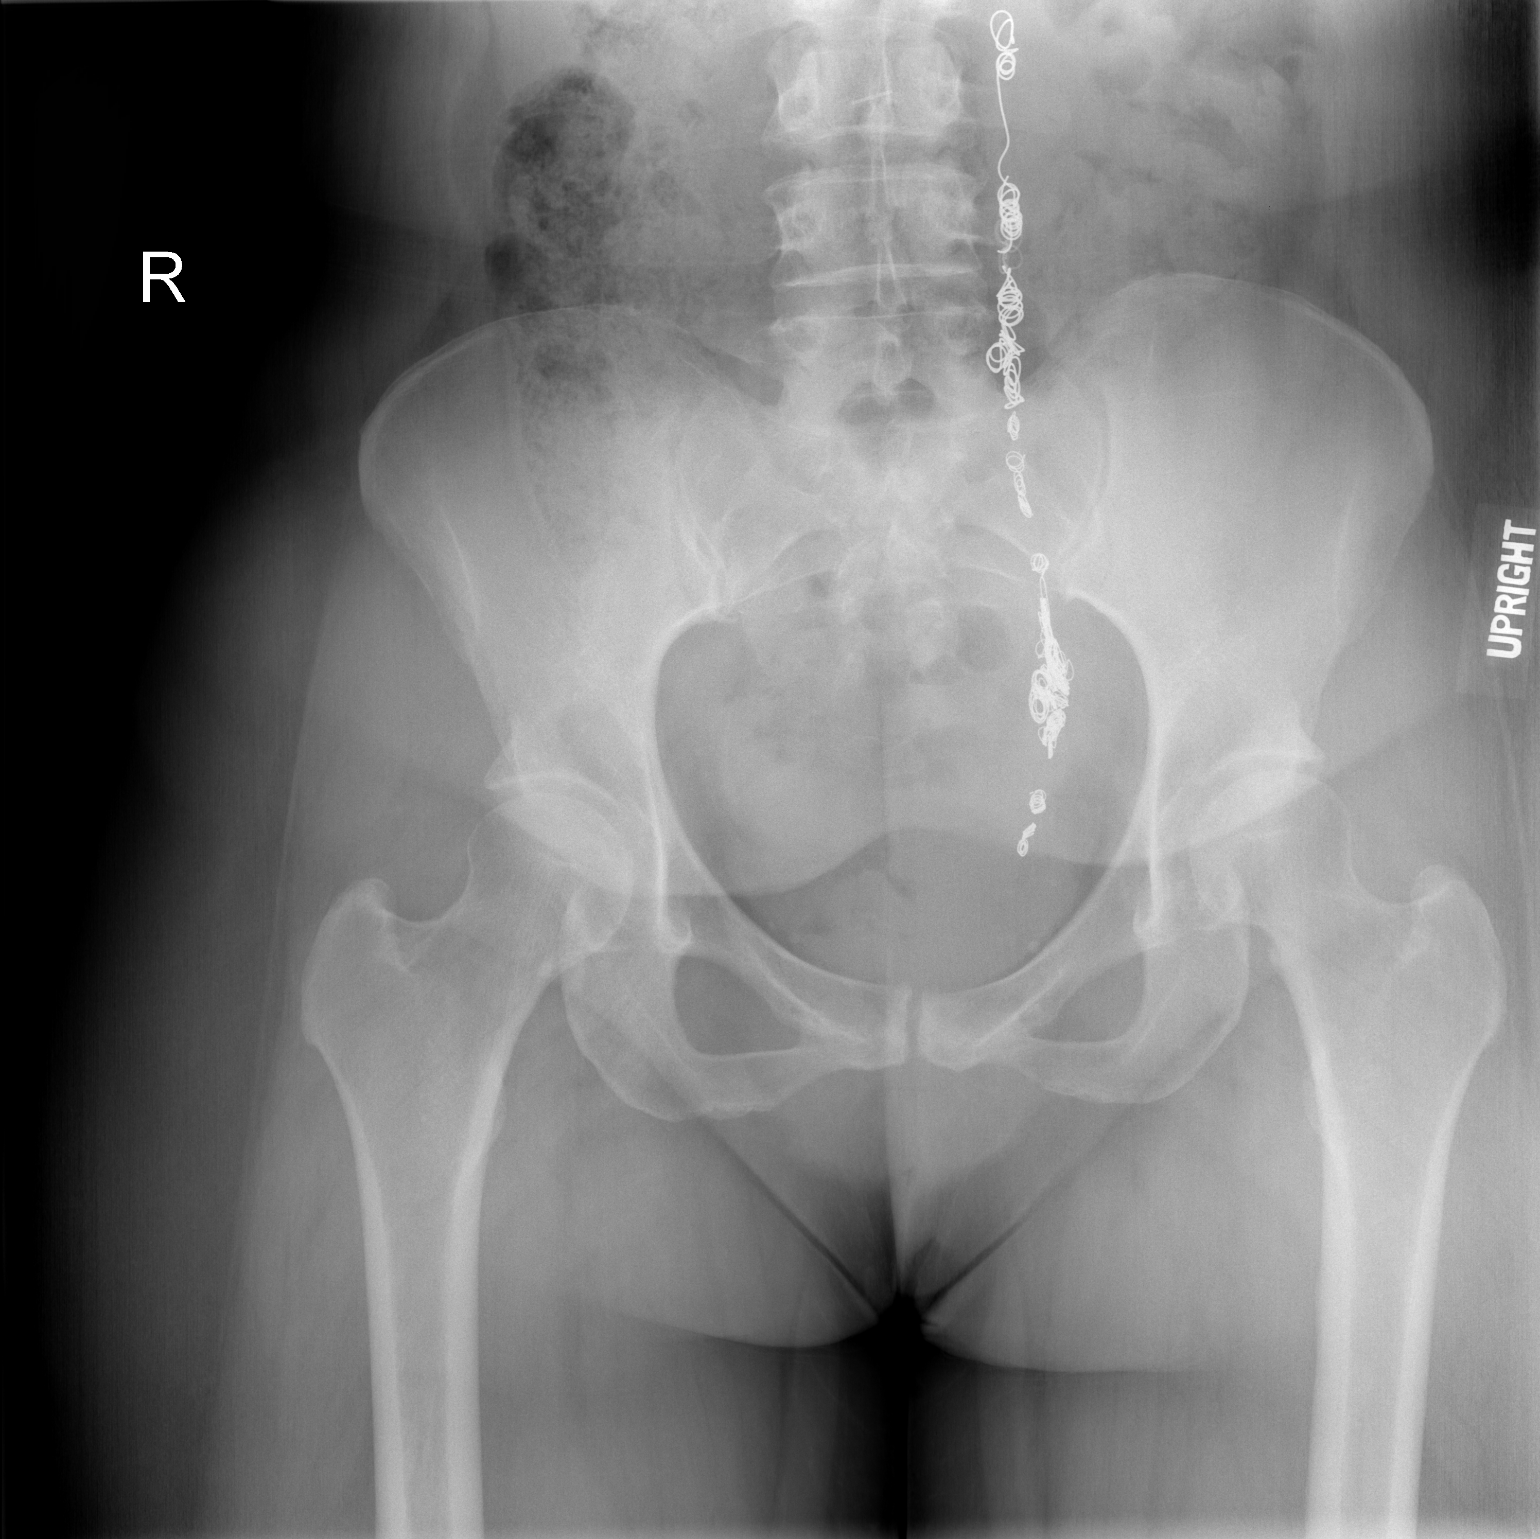

[w pelvis * (2 of 3)]
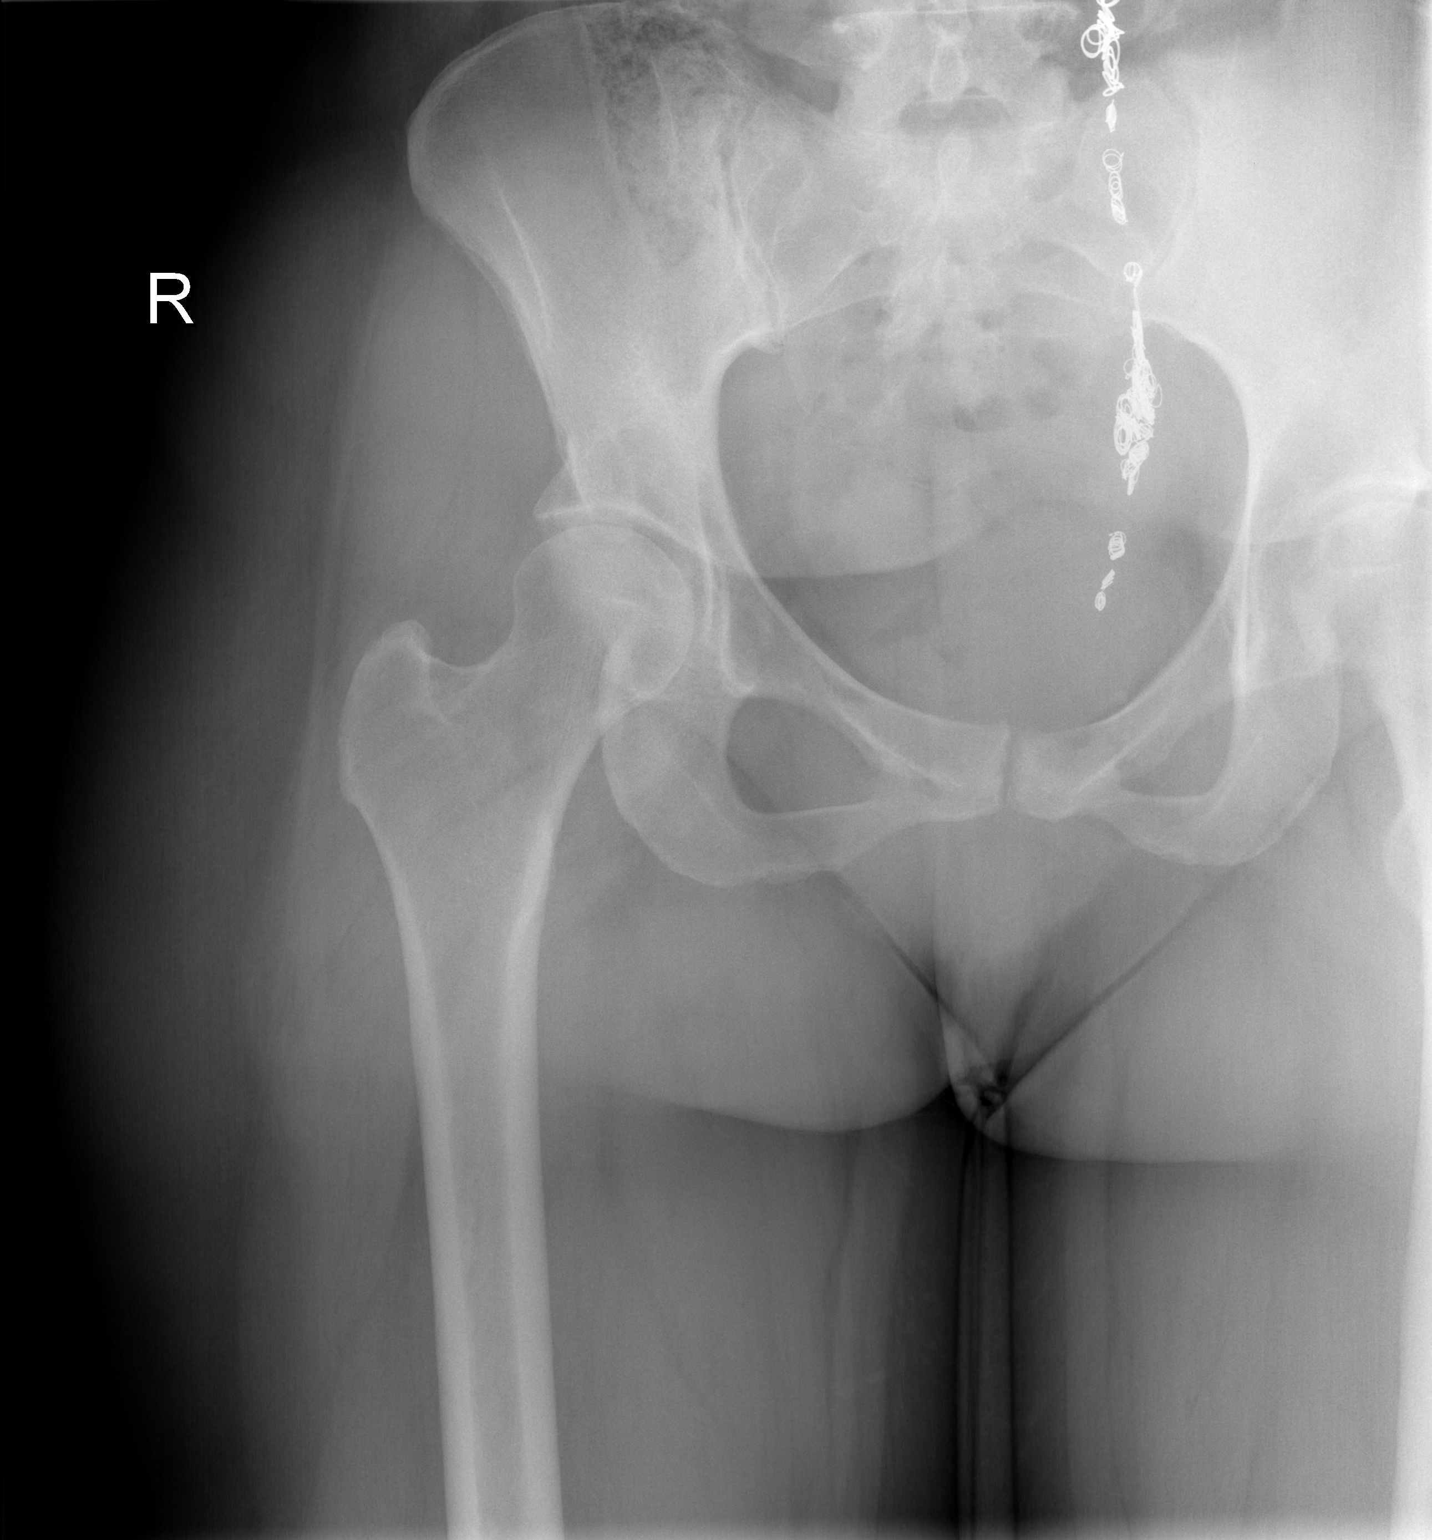

[w pelvis * (3 of 3)]
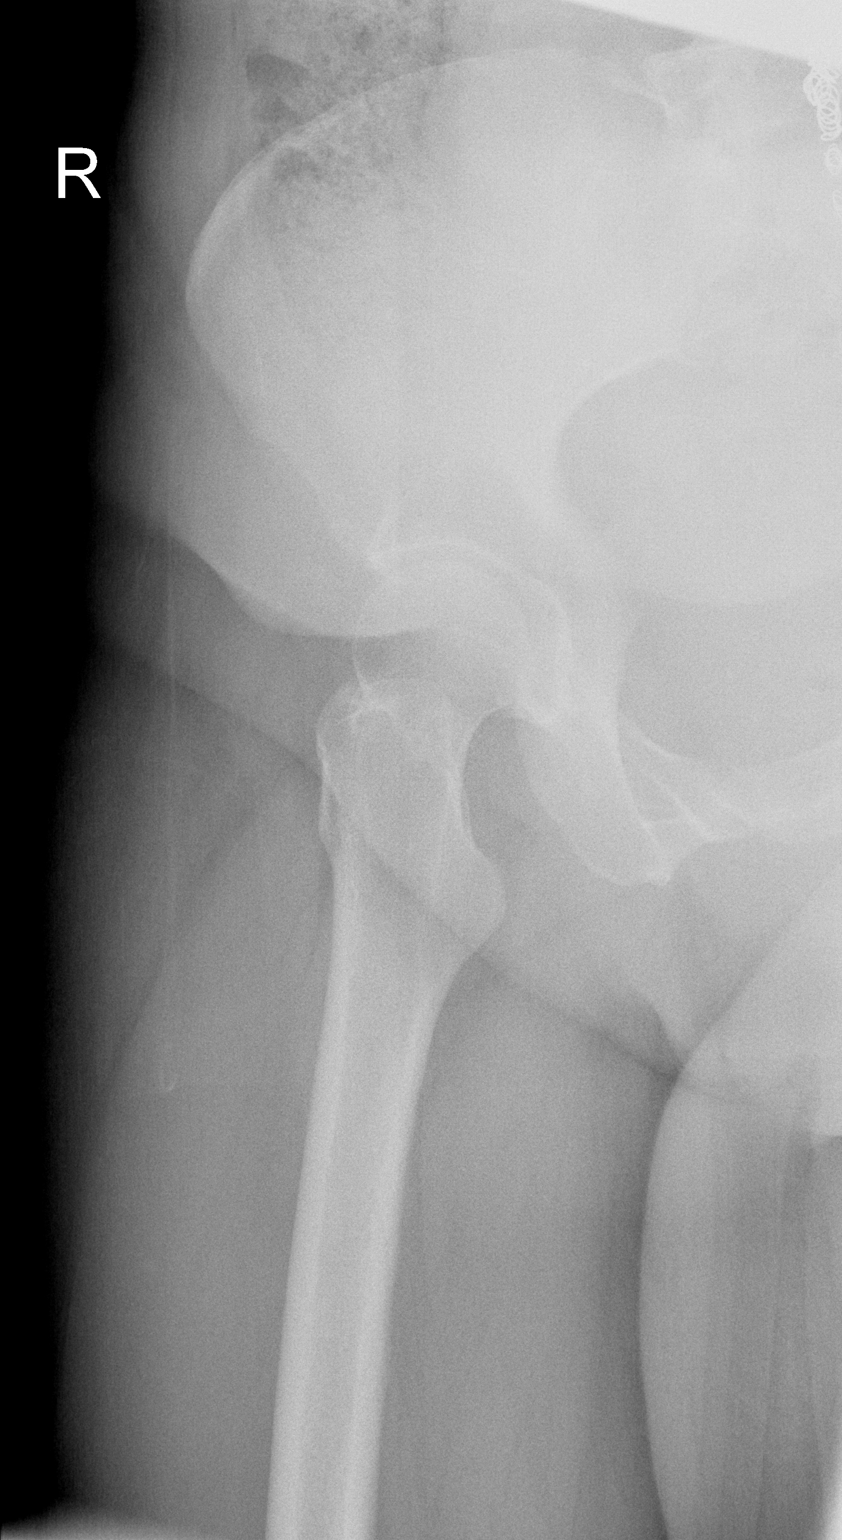

[3 of 3 positions shown; findings below may reference images not displayed]

FINDINGS: Pelvic ring is intact. No acute fracture or dislocation is noted.
Multiple coils are noted within the left ovarian vein consistent
with prior embolotherapy. No other focal abnormality is noted.
IMPRESSION: No acute abnormality noted.

## 2022-05-24 IMAGING — CR DG LUMBAR SPINE COMPLETE 4+V
4 series · 4 of 4 positions shown · non-contrast
Comparison: None.

CLINICAL DATA: Chronic right-sided back pain radiating into the
right hip, initial encounter

EXAM:
LUMBAR SPINE - COMPLETE 4+ VIEW

[w l-spine a.p. * (1 of 3)]
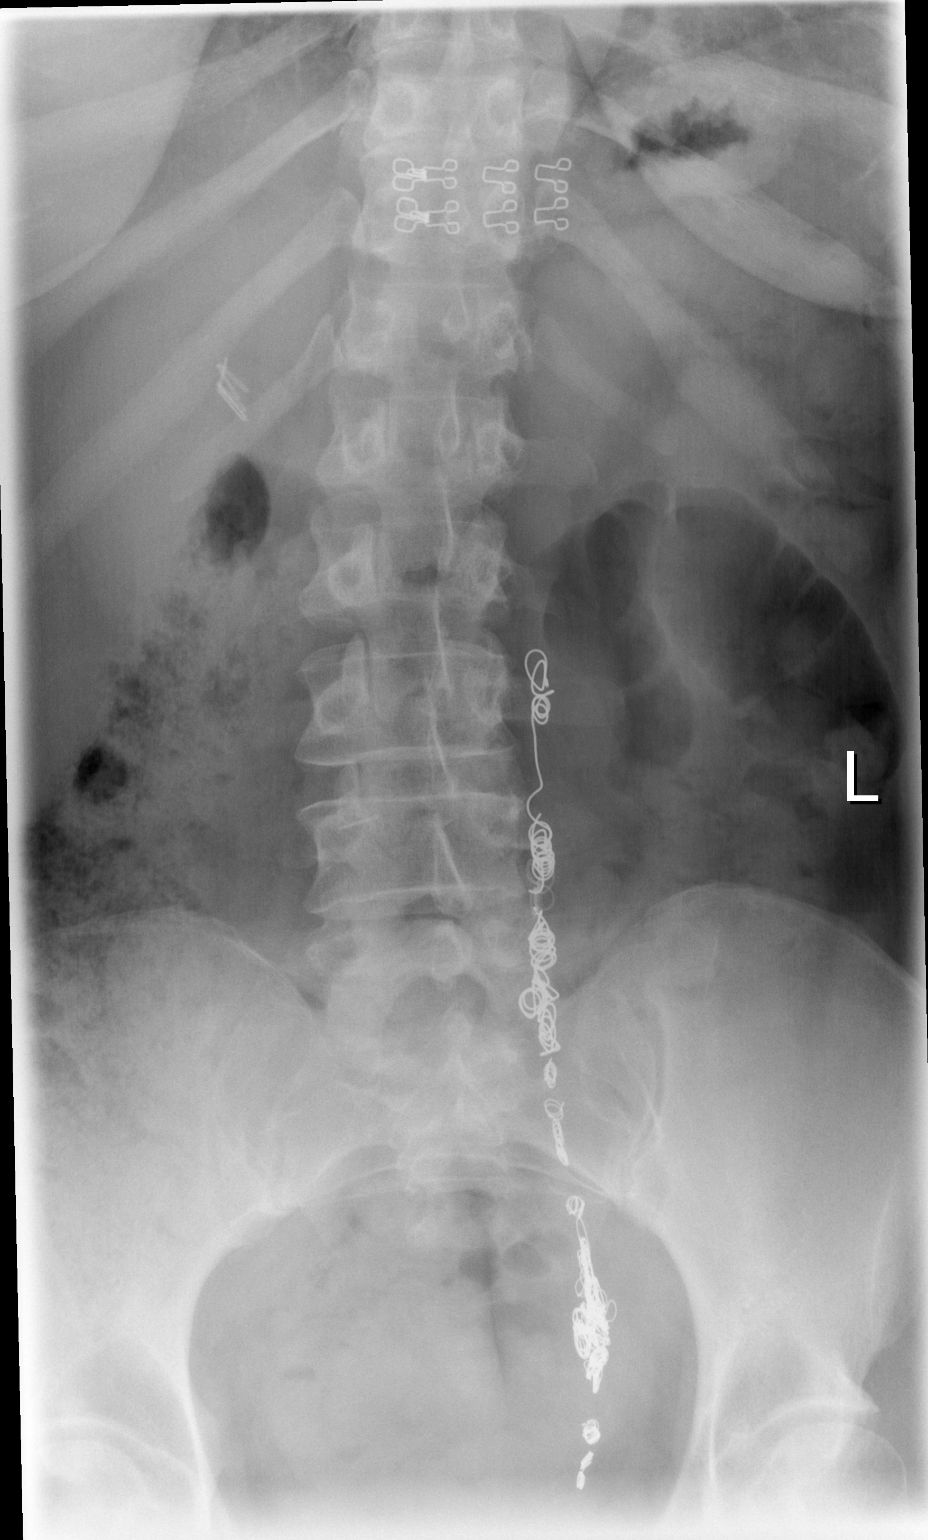

[w l-spine a.p. * (2 of 3)]
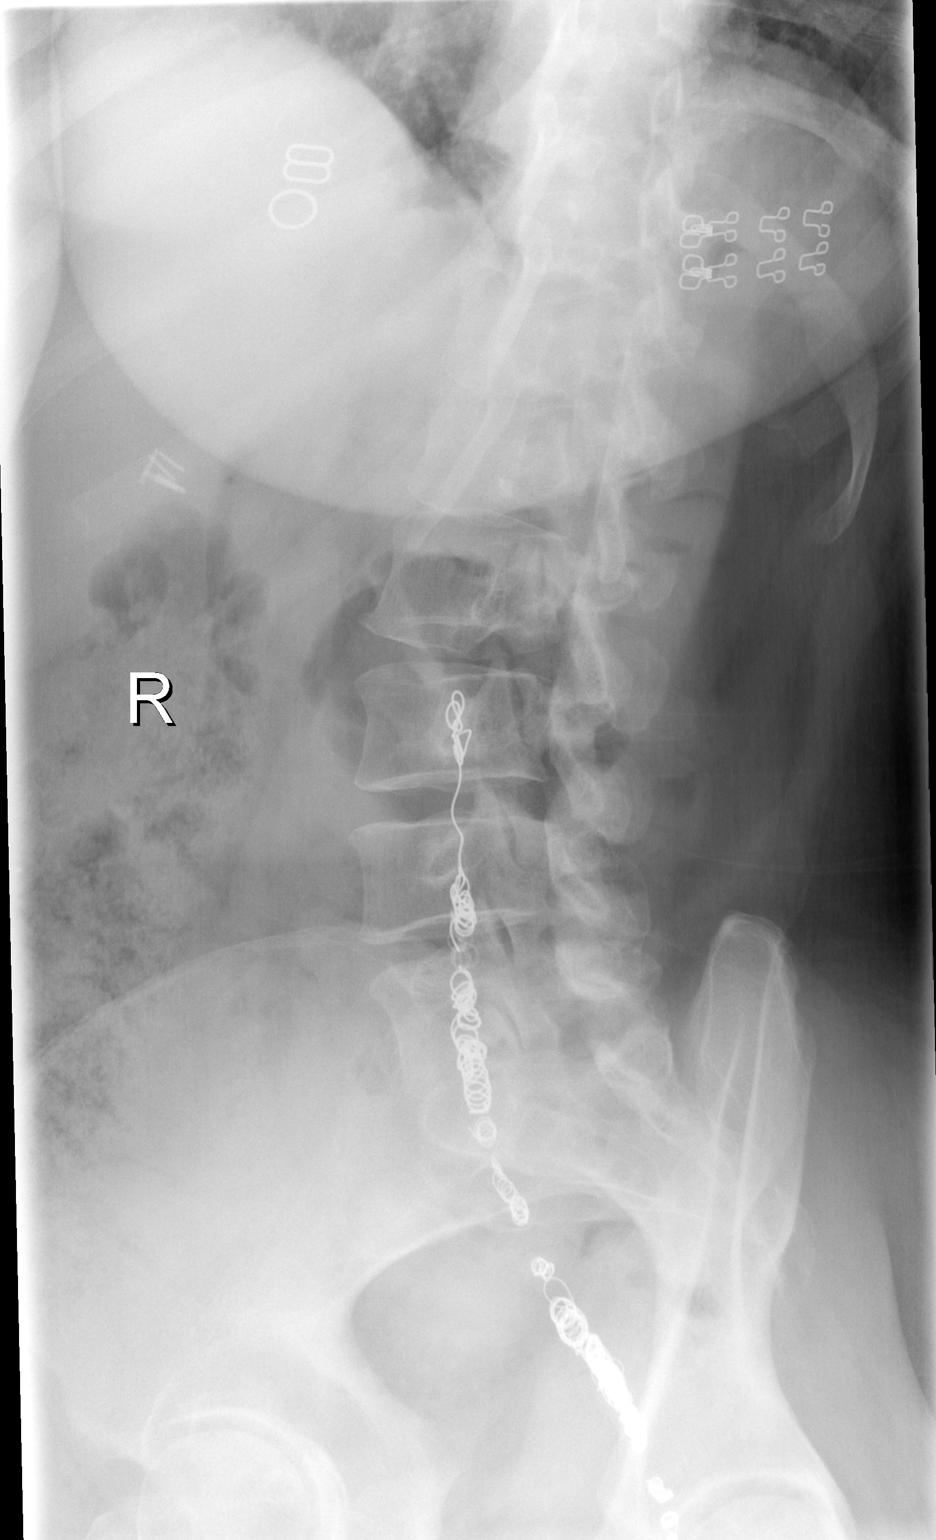

[w l-spine a.p. * (3 of 3)]
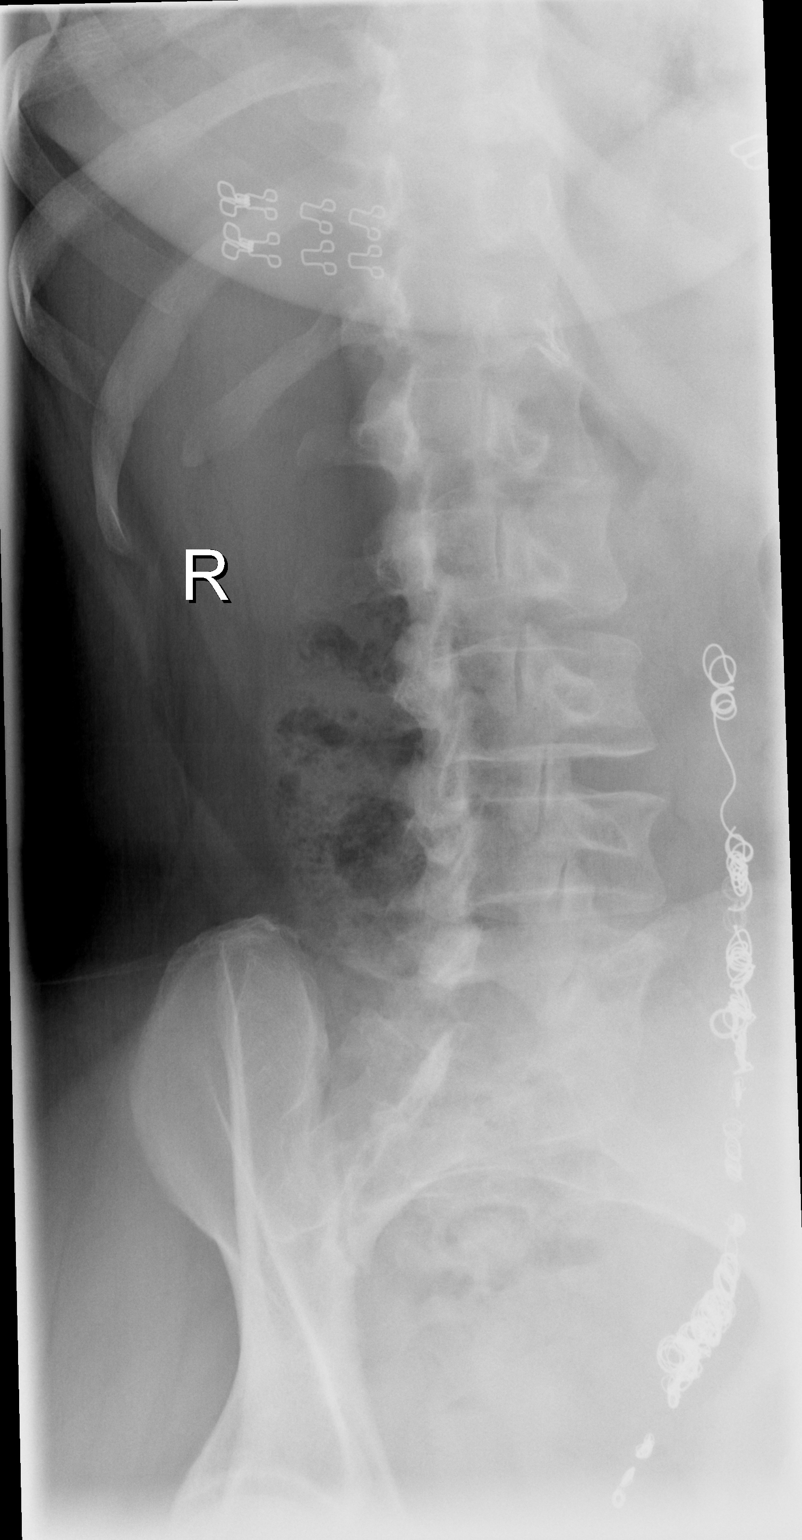

[w l-spine lat *]
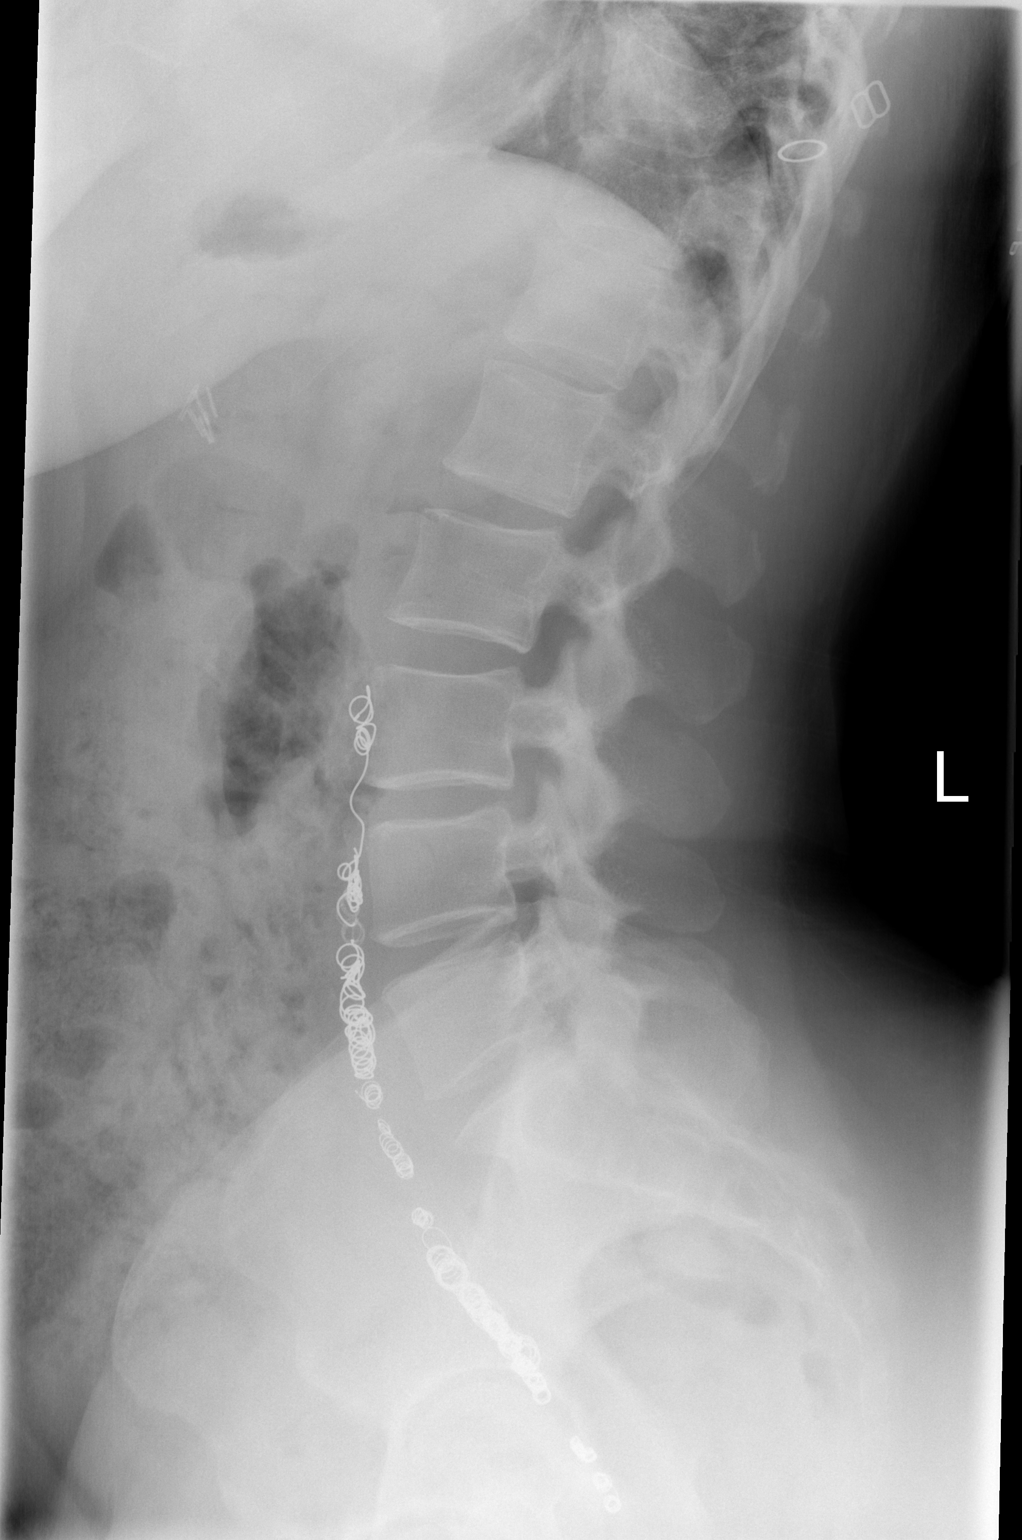

[4 of 4 positions shown; findings below may reference images not displayed]

FINDINGS: Five lumbar type vertebral bodies are well visualized. Vertebral
body height is well maintained. No pars defects are noted. No
anterolisthesis is seen. Changes of prior left ovarian vein
embolization and prior cholecystectomy are seen.
IMPRESSION: No acute abnormality noted.

## 2022-10-14 ENCOUNTER — Ambulatory Visit: Payer: Medicaid Other | Admitting: Student

## 2022-11-21 ENCOUNTER — Ambulatory Visit: Payer: Medicaid Other | Admitting: Student

## 2022-11-21 NOTE — Progress Notes (Deleted)
    SUBJECTIVE:   CHIEF COMPLAINT / HPI:   Had Robotic assisted lysis of adhesions, total laparoscopic hysterectomy, bilateral salpingectomy, sacrocolpopexy Lorna Few Lite Y), cystoscopy on 03/06/22  wit urogyn.   PERTINENT  PMH / PSH: ***  OBJECTIVE:   LMP 02/20/2022 (Exact Date)   ***  ASSESSMENT/PLAN:   No problem-specific Assessment & Plan notes found for this encounter.     Eliezer Mccoy, MD [facility]

## 2022-12-09 ENCOUNTER — Encounter: Payer: Self-pay | Admitting: Obstetrics and Gynecology

## 2023-01-02 ENCOUNTER — Ambulatory Visit: Payer: Medicaid Other | Admitting: Obstetrics and Gynecology

## 2023-05-19 NOTE — Telephone Encounter (Signed)
 Marland Kitchen

## 2023-07-29 ENCOUNTER — Encounter: Payer: Self-pay | Admitting: *Deleted

## 2023-08-02 ENCOUNTER — Emergency Department (HOSPITAL_COMMUNITY)

## 2023-08-02 ENCOUNTER — Encounter (HOSPITAL_COMMUNITY): Payer: Self-pay | Admitting: *Deleted

## 2023-08-02 ENCOUNTER — Emergency Department (HOSPITAL_COMMUNITY)
Admission: EM | Admit: 2023-08-02 | Discharge: 2023-08-02 | Disposition: A | Discharge status: Home / Self Care | Attending: Emergency Medicine | Admitting: Emergency Medicine

## 2023-08-02 ENCOUNTER — Other Ambulatory Visit: Payer: Self-pay

## 2023-08-02 DIAGNOSIS — F12929 Cannabis use, unspecified with intoxication, unspecified: Secondary | ICD-10-CM | POA: Diagnosis not present

## 2023-08-02 DIAGNOSIS — R400 Somnolence: Secondary | ICD-10-CM | POA: Diagnosis present

## 2023-08-02 LAB — RAPID URINE DRUG SCREEN, HOSP PERFORMED
Amphetamines: NOT DETECTED
Barbiturates: NOT DETECTED
Benzodiazepines: NOT DETECTED
Cocaine: NOT DETECTED
Opiates: NOT DETECTED
Tetrahydrocannabinol: POSITIVE — AB

## 2023-08-02 LAB — CBC
HCT: 38.6 % (ref 36.0–46.0)
Hemoglobin: 12.8 g/dL (ref 12.0–15.0)
MCH: 30 pg (ref 26.0–34.0)
MCHC: 33.2 g/dL (ref 30.0–36.0)
MCV: 90.6 fL (ref 80.0–100.0)
Platelets: 294 10*3/uL (ref 150–400)
RBC: 4.26 MIL/uL (ref 3.87–5.11)
RDW: 13.1 % (ref 11.5–15.5)
WBC: 12.5 10*3/uL — ABNORMAL HIGH (ref 4.0–10.5)
nRBC: 0 % (ref 0.0–0.2)

## 2023-08-02 LAB — BASIC METABOLIC PANEL WITH GFR
Anion gap: 11 (ref 5–15)
BUN: 7 mg/dL (ref 6–20)
CO2: 24 mmol/L (ref 22–32)
Calcium: 8.5 mg/dL — ABNORMAL LOW (ref 8.9–10.3)
Chloride: 104 mmol/L (ref 98–111)
Creatinine, Ser: 0.66 mg/dL (ref 0.44–1.00)
GFR, Estimated: >60 mL/min (ref >60)
Glucose, Bld: 160 mg/dL — ABNORMAL HIGH (ref 70–99)
Potassium: 2.9 mmol/L — ABNORMAL LOW (ref 3.5–5.1)
Sodium: 139 mmol/L (ref 135–145)

## 2023-08-02 LAB — TROPONIN I (HIGH SENSITIVITY)
Troponin I (High Sensitivity): <2 ng/L (ref <18)
Troponin I (High Sensitivity): <2 ng/L (ref <18)

## 2023-08-02 MED ORDER — CYCLOBENZAPRINE HCL 10 MG PO TABS
5.0000 mg | ORAL_TABLET | Freq: Once | ORAL | Status: AC
  Administered 2023-08-02: 5 mg via ORAL
  Filled 2023-08-02: qty 1

## 2023-08-02 MED ORDER — POTASSIUM CHLORIDE 20 MEQ PO PACK
80.0000 meq | PACK | Freq: Two times a day (BID) | ORAL | Status: DC
  Administered 2023-08-02: 80 meq via ORAL
  Filled 2023-08-02: qty 4

## 2023-08-02 MED ORDER — POTASSIUM CHLORIDE CRYS ER 20 MEQ PO TBCR: 20.0000 meq | EXTENDED_RELEASE_TABLET | Freq: Two times a day (BID) | ORAL | 0 refills | Status: AC

## 2023-08-02 MED ORDER — LORAZEPAM 1 MG PO TABS
1.0000 mg | ORAL_TABLET | Freq: Once | ORAL | Status: AC
  Administered 2023-08-02: 1 mg via ORAL
  Filled 2023-08-02: qty 1

## 2023-08-02 MED ORDER — SODIUM CHLORIDE 0.9 % IV BOLUS
1000.0000 mL | Freq: Once | INTRAVENOUS | Status: AC
  Administered 2023-08-02: 1000 mL via INTRAVENOUS

## 2023-08-02 NOTE — Discharge Instructions (Signed)
 It was a pleasure taking part in your care.  As discussed, please discontinue use of marijuana Gummies.  Please take potassium 20 mEq twice a day for the next 3 days.  Please follow-up with your PCP.  Return to the ED with any new or worsening symptoms.

## 2023-08-02 NOTE — ED Triage Notes (Signed)
 The pt reports that she ate a gummy  earlier very drowsy doesn't remember much  she called her friend  and asked her to bring her to the hospital  she reports that her heart is beating fast she  reports that she does not do this regularly  lmp none

## 2023-08-02 NOTE — ED Provider Notes (Addendum)
 [place] [facility]OSPITAL Provider Note   CSN: [id number] Arrival date & time: 08/02/23  8295     Patient presents with: drowsy   Katelyn Brown is a 53 y.o. female with medical history of IBS, vertigo, sciatica, seborrheic dermatitis, chronic right hip pain, chronic left shoulder pain.  Patient presents to ED for evaluation.  Per patient, she took some kind of unknown gummy this evening.  She is unable to provide much information as to the events that led up to her presentation tonight in the ED.  She is restlessly moving her legs in the bed.  She denies any drug use in the past but states that tonight she did take a gummy that would help her relax.  She is unable to tell me who get this coming to her, will count, it was.  She is complaining of a very dry mouth.  She denies any nausea or vomiting that occurred tonight.  Her daughter at the bedside confirms this.   HPI     Prior to Admission medications   Medication Sig Start Date End Date Taking? Authorizing Provider  potassium chloride SA (KLOR-CON M) 20 MEQ tablet Take 1 tablet (20 mEq total) by mouth 2 (two) times daily for 3 days. 08/02/23 08/05/23 Yes Adel Aden, PA-C  acetaminophen  (TYLENOL ) 500 MG tablet Take 1 tablet (500 mg total) by mouth every 6 (six) hours as needed (pain). 02/25/22   Arma Lamp, MD  amLODipine  (NORVASC ) 5 MG tablet Take 1 tablet (5 mg total) by mouth daily. 12/24/21   Ernestina Headland, MD  cyclobenzaprine  (FLEXERIL ) 5 MG tablet Take 5 mg by mouth 3 (three) times daily as needed for muscle spasms.    [provider]  fluticasone  (FLONASE ) 50 MCG/ACT nasal spray Place 1 spray into both nostrils daily as needed for allergies.    [provider]  glycerin  adult 2 g suppository Place 1 suppository rectally as needed for constipation. 03/07/22   Zuleta, Kaitlin G, NP  oxyCODONE  (OXY IR/ROXICODONE ) 5 MG immediate release tablet Take 1  tablet (5 mg total) by mouth every 4 (four) hours as needed for severe pain. 02/25/22   Arma Lamp, MD  polyethylene glycol powder (GLYCOLAX /MIRALAX ) 17 GM/SCOOP powder Take 17 g by mouth daily. Drink 17g (1 scoop) dissolved in water  per day. Patient taking differently: Take 17 g by mouth daily as needed for moderate constipation. Drink 17g (1 scoop) dissolved in water  per day. 02/25/22   Arma Lamp, MD  triamcinolone  ointment (KENALOG ) 0.5 % Apply 1 Application topically 2 (two) times daily. Do not use longer than 10 days without a physician's approval 08/08/21   Mordechai April, DO    Allergies: Patient has no known allergies.    Review of Systems  Unable to perform ROS: Acuity of condition (Level 5 caveat)  All other systems reviewed and are negative.   Updated Vital Signs BP 134/85   Pulse 70   Temp 98.2 F (36.8 C) (Oral)   Resp 16   Ht 5' 5 (1.651 m)   Wt 90.7 kg   LMP 02/20/2022 (Exact Date)   SpO2 100%   BMI 33.27 kg/m   Physical Exam Vitals and nursing note reviewed.  Constitutional:      General: She is not in acute distress.    Appearance: She is well-developed.  HENT:     Head: Normocephalic and atraumatic.   Eyes:     Conjunctiva/sclera: Conjunctivae normal.  Cardiovascular:     Rate and Rhythm: Normal rate and regular rhythm.     Heart sounds: No murmur heard. Pulmonary:     Effort: Pulmonary effort is normal. No respiratory distress.     Breath sounds: Normal breath sounds.  Abdominal:     Palpations: Abdomen is soft.     Tenderness: There is no abdominal tenderness.   Musculoskeletal:        General: No swelling.     Cervical back: Neck supple.   Skin:    General: Skin is warm and dry.     Capillary Refill: Capillary refill takes less than 2 seconds.   Neurological:     General: No focal deficit present.     Mental Status: She is alert and oriented to person, place, and time. Mental status is at baseline.     GCS: GCS eye  subscore is 4. GCS verbal subscore is 5. GCS motor subscore is 6.     Cranial Nerves: Cranial nerves 2-12 are intact. No cranial nerve deficit.     Sensory: Sensation is intact. No sensory deficit.     Motor: Motor function is intact. No weakness.     Coordination: Coordination is intact. Heel to [facility] Test normal.     Comments: CN III through XII intact.  Intact finger-nose, heel-to-shin.  No pronator drift, no slurred speech, no facial droop.  5 out of 5 strength bilateral upper extremities.  5 out of 5 strength bilateral lower extremities.  PERRL.  Tracks past midline.  Alert and orient x 3.  Psychiatric:        Mood and Affect: Mood normal.     (all labs ordered are listed, but only abnormal results are displayed) Labs Reviewed  CBC - Abnormal; Notable for the following components:      Result Value   WBC 12.5 (*)    All other components within normal limits  BASIC METABOLIC PANEL WITH GFR - Abnormal; Notable for the following components:   Potassium 2.9 (*)    Glucose, Bld 160 (*)    Calcium 8.5 (*)    All other components within normal limits  RAPID URINE DRUG SCREEN, HOSP PERFORMED  TROPONIN I (HIGH SENSITIVITY)  TROPONIN I (HIGH SENSITIVITY)    EKG: None  Radiology: CT Chest Wo Contrast Result Date: 08/02/2023 CLINICAL DATA:  Aspiration pneumonia. EXAM: CT CHEST WITHOUT CONTRAST TECHNIQUE: Multidetector CT imaging of the chest was performed following the standard protocol without IV contrast. RADIATION DOSE REDUCTION: This exam was performed according to the departmental dose-optimization program which includes automated exposure control, adjustment of the mA and/or kV according to patient size and/or use of iterative reconstruction technique. COMPARISON:  03/25/2022 FINDINGS: Cardiovascular: Heart size mildly enlarged. No substantial pericardial effusion. Coronary artery calcification is evident. Mediastinum/Nodes: No mediastinal lymphadenopathy. No evidence for gross hilar  lymphadenopathy although assessment is limited by the lack of intravenous contrast on the current study. The esophagus has normal imaging features. There is no axillary lymphadenopathy. Lungs/Pleura: 3 mm lingular nodule identified on image 83/4. No suspicious pulmonary nodule or mass. No focal airspace consolidation. No pleural effusion. No CT evidence to suggest aspiration. Minimal dependent atelectasis noted in the lung bases. Upper Abdomen: Visualized portion of the upper abdomen shows no acute findings. Musculoskeletal: No worrisome lytic or sclerotic osseous abnormality. IMPRESSION: 1. No CT evidence for aspiration. No evidence for pneumonia or pulmonary edema. No pleural effusion. 2. 3 mm lingular nodule. No follow-up needed if patient is low-risk.This recommendation  follows the consensus statement: Guidelines for Management of Incidental Pulmonary Nodules Detected on CT Images: From the Fleischner Society 2017; Radiology 2017; 284:228-243. Electronically Signed   By: Donnal Fusi M.D.   On: 08/02/2023 05:31   CT Head Wo Contrast Result Date: 08/02/2023 EXAM: CT HEAD WITHOUT CONTRAST 08/02/2023 03:10:20 AM TECHNIQUE: CT of the head was performed without the administration of intravenous contrast. Automated exposure control, iterative reconstruction, and/or weight based adjustment of the mA/kV was utilized to reduce the radiation dose to as low as reasonably achievable. COMPARISON: None available. CLINICAL HISTORY: Mental status change, unknown cause. Chief complaints; drowsy. FINDINGS: BRAIN AND VENTRICLES: There is no acute intracranial hemorrhage, mass effect or midline shift. No abnormal extra-axial fluid collection. The gray-white differentiation is maintained without an acute infarct. There is no hydrocephalus. ORBITS: The visualized portion of the orbits demonstrate no acute abnormality. SINUSES: The visualized paranasal sinuses and mastoid air cells demonstrate no acute abnormality. SOFT TISSUES  AND SKULL: No acute abnormality of the visualized skull or soft tissues. IMPRESSION: 1. No acute intracranial abnormality. Electronically signed by: Zadie Herter MD 08/02/2023 03:26 AM EDT RP Workstation: [id number]   DG Chest Portable 1 View Result Date: 08/02/2023 EXAM: 1 VIEW XRAY OF THE CHEST 08/02/2023 01:39:00 AM COMPARISON: CT chest dated 03/25/2022. CLINICAL HISTORY: CP. Pt ate a gummy; rapid heart rate CP. FINDINGS: LUNGS AND PLEURA: Low lung volumes. Interstitial markings are mildly increased in the left upper lobe, favoring vascular crowding although mild aspiration is possible. No focal pulmonary opacity. No pulmonary edema. No pleural effusion. No pneumothorax. HEART AND MEDIASTINUM: No acute abnormality of the cardiac and mediastinal silhouettes. BONES AND SOFT TISSUES: No acute osseous abnormality. IMPRESSION: 1. Low lung volumes. 2. Mildly increased interstitial markings in the left upper lobe, favoring vascular crowding, although mild aspiration is possible. Electronically signed by: Zadie Herter MD 08/02/2023 01:45 AM EDT RP Workstation: [id number]     Procedures   Medications Ordered in the ED  potassium chloride (KLOR-CON) packet 80 mEq (80 mEq Oral Given 08/02/23 0342)  LORazepam (ATIVAN) tablet 1 mg (1 mg Oral Given 08/02/23 0128)  sodium chloride  0.9 % bolus 1,000 mL (1,000 mLs Intravenous New Bag/Given 08/02/23 0342)  cyclobenzaprine  (FLEXERIL ) tablet 5 mg (5 mg Oral Given 08/02/23 0342)    Medical Decision Making Amount and/or Complexity of Data Reviewed Labs: ordered. Radiology: ordered.  Risk Prescription drug management.    53 year old female presents for evaluation.  Please see HPI for further details.  On examination the patient is afebrile and nontachycardic.  Her lung sounds are clear bilaterally, she is not hypoxic.  Abdomen soft and compressible.  Neurological examination at baseline.  Overall the patient is nontoxic in appearance.  She is seemingly  altered however able to answer questions appropriately, alert and orient x 3.  Suspect patient reaction to marijuana gummy.  She is unsure where this gummy was purchased or what was in it.  Patient given 1 mg Ativan with good effect.  Will assess with CBC, BMP, troponin x 2, UDS, CT head, chest x-ray.  CBC with slight leukocytosis 12.5, no anemia.  Suspect leukocytosis secondary to stress response.  Metabolic panel potassium 2.9 repleted with 80 mEq oral potassium.  No other electrolyte derangement, anion gap 11.  Troponin negative.  CT head unremarkable.  Chest x-ray shows concern for aspiration pneumonia.  CT chest collected which shows no evidence of aspiration pneumonia.  UDS positive for THC.  At this time, patient workup reassuring.  Patient able to  ambulate without difficulty.  Patient advised to discontinue use of marijuana Gummies.  Advised to follow-up with her PCP.  Will send patient home with 3 days of potassium which she will take twice a day.  Stable to discharge.  Final diagnoses:  Cannabis intoxication with complication [facility])    ED Discharge Orders          Ordered    potassium chloride SA (KLOR-CON M) 20 MEQ tablet  2 times daily        08/02/23 0541                Abhiraj Dozal F, PA-C 08/02/23 1610    Mesner, Reymundo Caulk, MD 08/02/23 [phone]

## 2023-10-16 NOTE — Progress Notes (Deleted)
    SUBJECTIVE:   CHIEF COMPLAINT / HPI:   ***  PERTINENT  PMH / PSH: ***  OBJECTIVE:   LMP 02/20/2022 (Exact Date)     ASSESSMENT/PLAN:   Assessment & Plan Allergic urticaria      Katelyn LITTIE Deed, MD [facility]

## 2023-10-17 ENCOUNTER — Ambulatory Visit: Payer: Self-pay | Admitting: Family Medicine

## 2023-10-17 ENCOUNTER — Ambulatory Visit (INDEPENDENT_AMBULATORY_CARE_PROVIDER_SITE_OTHER): Admitting: Family Medicine

## 2023-10-17 ENCOUNTER — Other Ambulatory Visit (HOSPITAL_COMMUNITY)
Admission: RE | Admit: 2023-10-17 | Discharge: 2023-10-17 | Disposition: A | Discharge status: Home / Self Care | Source: Ambulatory Visit | Attending: Family Medicine | Admitting: Family Medicine

## 2023-10-17 ENCOUNTER — Encounter: Payer: Self-pay | Admitting: Family Medicine

## 2023-10-17 VITALS — BP 144/74 | HR 75 | Temp 98.2°F | Ht 65.0 in | Wt 195.8 lb

## 2023-10-17 DIAGNOSIS — Z113 Encounter for screening for infections with a predominantly sexual mode of transmission: Secondary | ICD-10-CM

## 2023-10-17 DIAGNOSIS — Z114 Encounter for screening for human immunodeficiency virus [HIV]: Secondary | ICD-10-CM | POA: Diagnosis not present

## 2023-10-17 DIAGNOSIS — Z1231 Encounter for screening mammogram for malignant neoplasm of breast: Secondary | ICD-10-CM

## 2023-10-17 DIAGNOSIS — Z1211 Encounter for screening for malignant neoplasm of colon: Secondary | ICD-10-CM

## 2023-10-17 DIAGNOSIS — K921 Melena: Secondary | ICD-10-CM | POA: Diagnosis not present

## 2023-10-17 DIAGNOSIS — Z1159 Encounter for screening for other viral diseases: Secondary | ICD-10-CM

## 2023-10-17 DIAGNOSIS — R030 Elevated blood-pressure reading, without diagnosis of hypertension: Secondary | ICD-10-CM

## 2023-10-17 DIAGNOSIS — R61 Generalized hyperhidrosis: Secondary | ICD-10-CM | POA: Diagnosis not present

## 2023-10-17 LAB — POCT GLYCOSYLATED HEMOGLOBIN (HGB A1C): Hemoglobin A1C: 5.4 % (ref 4.0–5.6)

## 2023-10-17 NOTE — Progress Notes (Signed)
 SUBJECTIVE:   Chief compliant/HPI: annual examination  Katelyn Brown is a 53 y.o. who presents today for an annual exam.   Due for mammogram, colonoscopy, and vaccines.  Wants to think about Tdap and pneumonia vaccine. Needs TB skin test for work- will come back next week.  Blood in stool- started yesterday just when wiping, was constipated and had pain with defecation, hard stool, no blood in toilet bowl or large amount of blood. Has had this before a few months ago. Had a colonoscopy maybe 5 years ago, was normal, at a place on [address], has a history of IBS. No family history of colon cancer.  Hot flashes- over last several months, then will have cold spells too. Also mood feeling more irritated. Had hysterectomy in 2024 for uterine prolapse but left ovaries. Unsure if this could be menopause.  Desires STD testing today. New partner, supportive and safe relationship.  Elevated BP- has been told it has been high before, recently lost job, wonders if stress contributing. Does not take amlodipine  at home but is on her med list. ED visit in June had hypokalemia, did not take medication for it notes was never sent in. Had BF cuff at home SBP usually 130s.   History tabs reviewed and updated . No family history of breast cancer, colon cancer.  Review of systems form reviewed and notable for blood in stool, irritability/mood swings, hot flashes/night sweats.   OBJECTIVE:   BP (!) 144/74   Pulse 75   Temp 98.2 F (36.8 C) (Oral)   Ht 5' 5 (1.651 m)   Wt 195 lb 12.8 oz (88.8 kg)   LMP 02/20/2022 (Exact Date)   SpO2 100%   BMI 32.58 kg/m   General: A&O, NAD HEENT: No sign of trauma, EOM grossly intact Cardiac: RRR, no m/r/g Respiratory: CTAB, normal WOB, no w/c/r GI: Soft, NTTP, non-distended  Extremities: NTTP, no peripheral edema. Neuro: Normal gait, moves all four extremities appropriately. Psych: Appropriate mood and affect   ASSESSMENT/PLAN:    Assessment & Plan Elevated blood pressure reading Noted on repeat as well, discussed will check CMP if continued hypokalemia consider secondary HTN causes and further work up She will check BP at home with home arm cuff, if consistently SBP > 140 or DBP > 90 will schedule follow up to discuss medication Encounter for screening examination for sexually transmitted disease STI screen today, urine cytology and blood work Screen for colon cancer Referral to GI placed also due to blood in stool, states had normal one 5 years ago Need for hepatitis C screening test Hep C Ordered Screening for HIV (human immunodeficiency virus) HIV Ordered Encounter for screening mammogram for breast cancer Given number to call for mammogram, she notes not wanting to get done, discussed importance of catching/screening for breast cancer Night sweats With some hot flashes, no menses since hysterectomy, suspect menopause, no other concerning symptoms or B symptoms, will check FSH and CBC Blood in stool Mild amount associated with constipation, recommended Miralax , referral to GI for colonoscopy, and checking CBC today   Annual Examination  See AVS for age appropriate recommendations  PHQ score 1, reviewed and discussed.  BP reviewed and not at goal.  Asked about intimate partner violence and resources given as appropriate   Considered the following items based upon USPSTF recommendations: Diabetes screening: ordered Screening for elevated cholesterol: ordered HIV testing: ordered Hepatitis C: ordered Hepatitis B: ordered Syphilis if at high risk: ordered GC/CT at high risk, ordered.  Discussed family history, BRCA testing not indicated. Tool used to risk stratify was Higher education careers adviser.  Cervical cancer screening: not indicated given history of hysterectomy with prior normal cytology.  Breast cancer screening: discussed potential benefits, risks including overdiagnosis and biopsy, elected  proceed with mammogram Colorectal cancer screening: discussed, colonoscopy ordered Lung cancer screening: not indicated, non smoker Vaccinations offered Tdap and PCV20, she prefers to think about these.   Follow up in 1 year or sooner if indicated.  MyChart Activation: Already signed up  Rollene FORBES Keeling, MD [facility]

## 2023-10-18 LAB — CBC
Hematocrit: 43.4 % (ref 34.0–46.6)
Hemoglobin: 14.4 g/dL (ref 11.1–15.9)
MCH: 30.3 pg (ref 26.6–33.0)
MCHC: 33.2 g/dL (ref 31.5–35.7)
MCV: 91 fL (ref 79–97)
Platelets: 292 x10E3/uL (ref 150–450)
RBC: 4.75 x10E6/uL (ref 3.77–5.28)
RDW: 13.2 % (ref 11.7–15.4)
WBC: 9.6 x10E3/uL (ref 3.4–10.8)

## 2023-10-18 LAB — COMPREHENSIVE METABOLIC PANEL WITH GFR
ALT: 19 IU/L (ref 0–32)
AST: 16 IU/L (ref 0–40)
Albumin: 4.8 g/dL (ref 3.8–4.9)
Alkaline Phosphatase: 56 IU/L (ref 44–121)
BUN/Creatinine Ratio: 10 (ref 9–23)
BUN: 8 mg/dL (ref 6–24)
Bilirubin Total: 0.4 mg/dL (ref 0.0–1.2)
CO2: 25 mmol/L (ref 20–29)
Calcium: 9.2 mg/dL (ref 8.7–10.2)
Chloride: 102 mmol/L (ref 96–106)
Creatinine, Ser: 0.8 mg/dL (ref 0.57–1.00)
Globulin, Total: 2.9 g/dL (ref 1.5–4.5)
Glucose: 86 mg/dL (ref 70–99)
Potassium: 4 mmol/L (ref 3.5–5.2)
Sodium: 140 mmol/L (ref 134–144)
Total Protein: 7.7 g/dL (ref 6.0–8.5)
eGFR: 88 mL/min/1.73 (ref >59)

## 2023-10-18 LAB — LIPID PANEL
Chol/HDL Ratio: 5.1 ratio — ABNORMAL HIGH (ref 0.0–4.4)
Cholesterol, Total: 209 mg/dL — ABNORMAL HIGH (ref 100–199)
HDL: 41 mg/dL (ref >39)
LDL Chol Calc (NIH): 155 mg/dL — ABNORMAL HIGH (ref 0–99)
Triglycerides: 73 mg/dL (ref 0–149)
VLDL Cholesterol Cal: 13 mg/dL (ref 5–40)

## 2023-10-18 LAB — HIV ANTIBODY (ROUTINE TESTING W REFLEX): HIV Screen 4th Generation wRfx: NONREACTIVE

## 2023-10-18 LAB — FOLLICLE STIMULATING HORMONE: FSH: 63.3 m[IU]/mL

## 2023-10-18 LAB — HEPATITIS C ANTIBODY: Hep C Virus Ab: NONREACTIVE

## 2023-10-18 LAB — TSH RFX ON ABNORMAL TO FREE T4: TSH: 1.74 u[IU]/mL (ref 0.450–4.500)

## 2023-10-18 LAB — HEPATITIS B SURFACE ANTIBODY, QUANTITATIVE: Hepatitis B Surf Ab Quant: <3.5 m[IU]/mL — ABNORMAL LOW

## 2023-10-18 LAB — HEPATITIS B SURFACE ANTIGEN: Hepatitis B Surface Ag: NEGATIVE

## 2023-10-18 LAB — RPR: RPR Ser Ql: NONREACTIVE

## 2023-10-21 LAB — URINE CYTOLOGY ANCILLARY ONLY
Chlamydia: NEGATIVE
Comment: NEGATIVE
Comment: NORMAL
Neisseria Gonorrhea: NEGATIVE

## 2023-12-23 ENCOUNTER — Encounter: Payer: Self-pay | Admitting: Family Medicine

## 2023-12-23 ENCOUNTER — Ambulatory Visit (INDEPENDENT_AMBULATORY_CARE_PROVIDER_SITE_OTHER): Admitting: Family Medicine

## 2023-12-23 VITALS — BP 125/82 | HR 84 | Ht 65.0 in | Wt 202.4 lb

## 2023-12-23 DIAGNOSIS — K029 Dental caries, unspecified: Secondary | ICD-10-CM

## 2023-12-23 MED ORDER — AMOXICILLIN 500 MG PO CAPS: 500.0000 mg | ORAL_CAPSULE | Freq: Two times a day (BID) | ORAL | 0 refills | Status: AC

## 2023-12-23 MED ORDER — FLUCONAZOLE 150 MG PO TABS: 150.0000 mg | ORAL_TABLET | Freq: Once | ORAL | 0 refills | Status: AC | PRN

## 2023-12-23 NOTE — Progress Notes (Unsigned)
    SUBJECTIVE:   CHIEF COMPLAINT / HPI:   Gabriel is a 53 year old female that pf dental issues.  - Reports this has been ongoing for a while, since summer. She does not have dental insurance - This has been going on since summer.  - The pain is R sided, on both top and bottom rows of teeth.  - She just started working and should get dental insurance in 1 month.   - She i   PERTINENT  PMH / PSH: ***  OBJECTIVE:   BP (!) 123/99   Pulse 84   Ht 5' 5 (1.651 m)   Wt 202 lb 6.4 oz (91.8 kg)   LMP 02/20/2022 (Exact Date)   SpO2 100%   BMI 33.68 kg/m   General: Alert, pleasant woman. NAD. HEENT: NCAT. MMM. Dental caries noted on both top and bottom molars on R side of mouth, No facial swelling CV: RRR, no murmurs. Cap refill <2. Resp: CTAB, no wheezing or crackles. Normal WOB on RA.  Abm: Soft, nontender, nondistended. BS present. Ext: Moves all ext spontaneously Skin: Warm, well perfused   Dental caries noted on both top and bottom molars on R side of mouth, No facial swelling ASSESSMENT/PLAN:   Assessment & Plan Dental caries    - BP likely elevated 2/2 pain    Twyla Nearing, MD [facility]

## 2023-12-23 NOTE — Patient Instructions (Signed)
 1) For your dental infections - Start taking amoxocillin 500mg  twice a day for 10 days - Take tylenol  and ibuprofen  as needed for pain - Come back in 1 week if the pain is not improviing  Please seek emergency care if you: - start having fevers of 100.3 F or higher - have trouble eating/drinking due to pain - have trouble breathing

## 2024-03-24 ENCOUNTER — Ambulatory Visit: Admitting: Obstetrics and Gynecology

## 2024-03-24 ENCOUNTER — Encounter: Payer: Self-pay | Admitting: Obstetrics and Gynecology

## 2024-03-24 VITALS — BP 151/94 | HR 73

## 2024-03-24 DIAGNOSIS — N941 Unspecified dyspareunia: Secondary | ICD-10-CM | POA: Insufficient documentation

## 2024-03-24 DIAGNOSIS — N393 Stress incontinence (female) (male): Secondary | ICD-10-CM | POA: Diagnosis not present

## 2024-03-24 NOTE — Progress Notes (Signed)
 [facility] Return Visit  SUBJECTIVE  History of Present Illness: Gudelia Eugene is a 54 y.o. female seen in follow-up. She is s/p Robotic assisted lysis of adhesions, total laparoscopic hysterectomy, bilateral salpingectomy, sacrocolpopexy Lucita Lite Y), cystoscopy on 03/06/22.   One time after intercourse a few weeks ago, she noticed some slight pink blood and had some pain during deeper inside. She did not use lubricant with intercourse, used a condom. Felt the sex was a little rough.   Denies pain with urination, urgency/ frequency. She is having some leakage with cough/ sneeze- maybe a few times a week. Has some constipation, has a bowel movement every few days. She occasionally takes a tea or dulcolax. Had taken miralax  before.   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   Past Surgical History: She  has a past surgical history that includes Facial fracture surgery (1993); Tubal ligation (1996); Eye surgery (1993); Fracture surgery; Cholecystectomy (N/A, 03/15/2013); Embolization (2015); Liposuction (2019); Endometrial biopsy (11/27/2021); Xi robotic assisted total hysterectomy with sacrocolpopexy (N/A, 03/06/2022); and Cystoscopy (N/A, 03/06/2022).   Medications: She has a current medication list which includes the following prescription(s): acetaminophen , amlodipine , amoxicillin , cyclobenzaprine , fluconazole , polyethylene glycol powder, potassium chloride   sa, and triamcinolone  ointment.   Allergies: Patient has no known allergies.   Social History: Patient  reports that she has never smoked. She has never used smokeless tobacco. She reports current alcohol use. She reports that she does not use drugs.     OBJECTIVE     Physical Exam: Vitals:   03/24/24 0811  BP: (!) 151/94  Pulse: 73   Gen: No apparent distress, A&O x 3.  Detailed Urogynecologic Evaluation:  Normal external genitalia. On speculum, normal vaginal mucosa, no evidence of mesh exposure. On bimanual, no masses present, no palpable mesh. Tenderness of levator muscles on the right.     ASSESSMENT AND PLAN    Ms. Schroeter is a 54 y.o. with:  1. SUI (stress urinary incontinence, female)   2. Dyspareunia, female     SUI (stress urinary incontinence, female) Assessment & Plan: - Discussed pelvic PT and referral placed. She may also be interested in urethral bulking in office if she does not see improvement with PT. Brochure provided on bulkamid for her to review.  - Will need simple CMG to demonstrate leakage if she wants to proceed.   Orders: -     AMB referral to rehabilitation  Dyspareunia, female Assessment & Plan: - No mesh exposure noted.  - Tenderness of pelvic floor muscles- advised to start pelvic PT - Recommended silicone based lubricant for intercourse.   Orders: -     AMB referral to rehabilitation  Return 3 months   Rosaline LOISE Caper, MD  Time spent: I spent 25 minutes dedicated to the care of this patient on the date of this encounter to include pre-visit review of records, face-to-face time with the patient and post visit documentation.

## 2024-03-24 NOTE — Assessment & Plan Note (Addendum)
-   Discussed pelvic PT and referral placed. She may also be interested in urethral bulking in office if she does not see improvement with PT. Brochure provided on bulkamid for her to review.  - Will need simple CMG to demonstrate leakage if she wants to proceed.

## 2024-03-24 NOTE — Assessment & Plan Note (Signed)
-   No mesh exposure noted.  - Tenderness of pelvic floor muscles- advised to start pelvic PT - Recommended silicone based lubricant for intercourse.

## 2024-06-22 ENCOUNTER — Ambulatory Visit: Admitting: Obstetrics and Gynecology
